# Patient Record
Sex: Female | Born: 1950 | Race: White | Hispanic: No | Marital: Single | Smoking: Former smoker
Health system: Southern US, Community
[De-identification: ages and names within clinical notes are randomized; demographics above are authoritative.]

## PROBLEM LIST (undated history)

## (undated) DIAGNOSIS — K219 Gastro-esophageal reflux disease without esophagitis: Secondary | ICD-10-CM

## (undated) DIAGNOSIS — R112 Nausea with vomiting, unspecified: Secondary | ICD-10-CM

## (undated) DIAGNOSIS — T8859XA Other complications of anesthesia, initial encounter: Secondary | ICD-10-CM

## (undated) DIAGNOSIS — I10 Essential (primary) hypertension: Secondary | ICD-10-CM

## (undated) DIAGNOSIS — E039 Hypothyroidism, unspecified: Secondary | ICD-10-CM

## (undated) DIAGNOSIS — E079 Disorder of thyroid, unspecified: Secondary | ICD-10-CM

## (undated) DIAGNOSIS — T4145XA Adverse effect of unspecified anesthetic, initial encounter: Secondary | ICD-10-CM

## (undated) DIAGNOSIS — Z8619 Personal history of other infectious and parasitic diseases: Secondary | ICD-10-CM

## (undated) DIAGNOSIS — E785 Hyperlipidemia, unspecified: Secondary | ICD-10-CM

## (undated) DIAGNOSIS — G510 Bell's palsy: Secondary | ICD-10-CM

## (undated) DIAGNOSIS — M1990 Unspecified osteoarthritis, unspecified site: Secondary | ICD-10-CM

## (undated) DIAGNOSIS — Z9889 Other specified postprocedural states: Secondary | ICD-10-CM

## (undated) HISTORY — PX: BREAST CYST EXCISION: SHX579

## (undated) HISTORY — PX: THYROID SURGERY: SHX805

## (undated) HISTORY — PX: OTHER SURGICAL HISTORY: SHX169

## (undated) HISTORY — PX: CHOLECYSTECTOMY: SHX55

## (undated) HISTORY — PX: APPENDECTOMY: SHX54

## (undated) HISTORY — PX: COLONOSCOPY: SHX174

## (undated) HISTORY — PX: ABDOMINAL HYSTERECTOMY: SHX81

---

## 1979-08-01 HISTORY — PX: BREAST EXCISIONAL BIOPSY: SUR124

## 1998-11-29 ENCOUNTER — Inpatient Hospital Stay (HOSPITAL_COMMUNITY): Admission: AD | Admit: 1998-11-29 | Discharge: 1998-11-30 | Payer: Self-pay | Admitting: Family Medicine

## 1998-12-04 ENCOUNTER — Encounter: Admission: RE | Admit: 1998-12-04 | Discharge: 1998-12-04 | Payer: Self-pay | Admitting: Family Medicine

## 1999-03-02 ENCOUNTER — Other Ambulatory Visit: Admission: RE | Admit: 1999-03-02 | Discharge: 1999-03-02 | Payer: Self-pay | Admitting: Family Medicine

## 2000-03-20 ENCOUNTER — Encounter: Admission: RE | Admit: 2000-03-20 | Discharge: 2000-03-20 | Payer: Self-pay | Admitting: Family Medicine

## 2000-03-20 ENCOUNTER — Encounter: Payer: Self-pay | Admitting: Family Medicine

## 2000-09-20 ENCOUNTER — Encounter: Payer: Self-pay | Admitting: Family Medicine

## 2000-09-20 ENCOUNTER — Encounter: Admission: RE | Admit: 2000-09-20 | Discharge: 2000-09-20 | Payer: Self-pay | Admitting: Family Medicine

## 2001-03-21 ENCOUNTER — Encounter: Payer: Self-pay | Admitting: Family Medicine

## 2001-03-21 ENCOUNTER — Encounter: Admission: RE | Admit: 2001-03-21 | Discharge: 2001-03-21 | Payer: Self-pay | Admitting: Family Medicine

## 2003-08-09 ENCOUNTER — Inpatient Hospital Stay (HOSPITAL_COMMUNITY): Admission: AD | Admit: 2003-08-09 | Discharge: 2003-08-10 | Payer: Self-pay | Admitting: Emergency Medicine

## 2012-10-03 DIAGNOSIS — H7419 Adhesive middle ear disease, unspecified ear: Secondary | ICD-10-CM | POA: Insufficient documentation

## 2012-10-03 DIAGNOSIS — H902 Conductive hearing loss, unspecified: Secondary | ICD-10-CM | POA: Insufficient documentation

## 2012-11-04 DIAGNOSIS — M26629 Arthralgia of temporomandibular joint, unspecified side: Secondary | ICD-10-CM | POA: Insufficient documentation

## 2012-11-04 DIAGNOSIS — H908 Mixed conductive and sensorineural hearing loss, unspecified: Secondary | ICD-10-CM | POA: Insufficient documentation

## 2012-11-04 DIAGNOSIS — H7419 Adhesive middle ear disease, unspecified ear: Secondary | ICD-10-CM | POA: Insufficient documentation

## 2015-05-21 DIAGNOSIS — Z6831 Body mass index (BMI) 31.0-31.9, adult: Secondary | ICD-10-CM | POA: Diagnosis not present

## 2015-05-21 DIAGNOSIS — R21 Rash and other nonspecific skin eruption: Secondary | ICD-10-CM | POA: Diagnosis not present

## 2015-05-21 DIAGNOSIS — M25571 Pain in right ankle and joints of right foot: Secondary | ICD-10-CM | POA: Diagnosis not present

## 2015-05-21 DIAGNOSIS — L918 Other hypertrophic disorders of the skin: Secondary | ICD-10-CM | POA: Diagnosis not present

## 2015-05-24 DIAGNOSIS — L918 Other hypertrophic disorders of the skin: Secondary | ICD-10-CM | POA: Diagnosis not present

## 2015-05-24 DIAGNOSIS — Z23 Encounter for immunization: Secondary | ICD-10-CM | POA: Diagnosis not present

## 2015-07-08 DIAGNOSIS — Z6830 Body mass index (BMI) 30.0-30.9, adult: Secondary | ICD-10-CM | POA: Diagnosis not present

## 2015-07-08 DIAGNOSIS — E039 Hypothyroidism, unspecified: Secondary | ICD-10-CM | POA: Diagnosis not present

## 2015-07-08 DIAGNOSIS — E7800 Pure hypercholesterolemia, unspecified: Secondary | ICD-10-CM | POA: Diagnosis not present

## 2015-07-08 DIAGNOSIS — K219 Gastro-esophageal reflux disease without esophagitis: Secondary | ICD-10-CM | POA: Diagnosis not present

## 2015-07-08 DIAGNOSIS — R7303 Prediabetes: Secondary | ICD-10-CM | POA: Diagnosis not present

## 2015-07-08 DIAGNOSIS — J309 Allergic rhinitis, unspecified: Secondary | ICD-10-CM | POA: Diagnosis not present

## 2015-08-16 DIAGNOSIS — Z23 Encounter for immunization: Secondary | ICD-10-CM | POA: Diagnosis not present

## 2015-08-17 DIAGNOSIS — Z6830 Body mass index (BMI) 30.0-30.9, adult: Secondary | ICD-10-CM | POA: Diagnosis not present

## 2015-08-17 DIAGNOSIS — N6082 Other benign mammary dysplasias of left breast: Secondary | ICD-10-CM | POA: Diagnosis not present

## 2015-11-04 DIAGNOSIS — H35362 Drusen (degenerative) of macula, left eye: Secondary | ICD-10-CM | POA: Diagnosis not present

## 2016-01-17 DIAGNOSIS — R7303 Prediabetes: Secondary | ICD-10-CM | POA: Diagnosis not present

## 2016-01-17 DIAGNOSIS — Z1231 Encounter for screening mammogram for malignant neoplasm of breast: Secondary | ICD-10-CM | POA: Diagnosis not present

## 2016-01-17 DIAGNOSIS — Z1389 Encounter for screening for other disorder: Secondary | ICD-10-CM | POA: Diagnosis not present

## 2016-01-17 DIAGNOSIS — E890 Postprocedural hypothyroidism: Secondary | ICD-10-CM | POA: Diagnosis not present

## 2016-01-17 DIAGNOSIS — Z6830 Body mass index (BMI) 30.0-30.9, adult: Secondary | ICD-10-CM | POA: Diagnosis not present

## 2016-01-17 DIAGNOSIS — Z9181 History of falling: Secondary | ICD-10-CM | POA: Diagnosis not present

## 2016-01-17 DIAGNOSIS — E7800 Pure hypercholesterolemia, unspecified: Secondary | ICD-10-CM | POA: Diagnosis not present

## 2016-01-17 DIAGNOSIS — E2839 Other primary ovarian failure: Secondary | ICD-10-CM | POA: Diagnosis not present

## 2016-01-17 DIAGNOSIS — K219 Gastro-esophageal reflux disease without esophagitis: Secondary | ICD-10-CM | POA: Diagnosis not present

## 2016-01-17 DIAGNOSIS — Z79899 Other long term (current) drug therapy: Secondary | ICD-10-CM | POA: Diagnosis not present

## 2016-02-03 DIAGNOSIS — Z13820 Encounter for screening for osteoporosis: Secondary | ICD-10-CM | POA: Diagnosis not present

## 2016-02-03 DIAGNOSIS — M8580 Other specified disorders of bone density and structure, unspecified site: Secondary | ICD-10-CM | POA: Diagnosis not present

## 2016-02-03 DIAGNOSIS — Z1231 Encounter for screening mammogram for malignant neoplasm of breast: Secondary | ICD-10-CM | POA: Diagnosis not present

## 2016-02-03 DIAGNOSIS — R29890 Loss of height: Secondary | ICD-10-CM | POA: Diagnosis not present

## 2016-02-03 DIAGNOSIS — E2839 Other primary ovarian failure: Secondary | ICD-10-CM | POA: Diagnosis not present

## 2016-03-07 DIAGNOSIS — J309 Allergic rhinitis, unspecified: Secondary | ICD-10-CM | POA: Diagnosis not present

## 2016-03-07 DIAGNOSIS — Z6831 Body mass index (BMI) 31.0-31.9, adult: Secondary | ICD-10-CM | POA: Diagnosis not present

## 2016-03-07 DIAGNOSIS — M19041 Primary osteoarthritis, right hand: Secondary | ICD-10-CM | POA: Diagnosis not present

## 2016-05-20 DIAGNOSIS — R69 Illness, unspecified: Secondary | ICD-10-CM | POA: Diagnosis not present

## 2016-07-14 DIAGNOSIS — R7303 Prediabetes: Secondary | ICD-10-CM | POA: Diagnosis not present

## 2016-07-14 DIAGNOSIS — E890 Postprocedural hypothyroidism: Secondary | ICD-10-CM | POA: Diagnosis not present

## 2016-07-14 DIAGNOSIS — E7800 Pure hypercholesterolemia, unspecified: Secondary | ICD-10-CM | POA: Diagnosis not present

## 2016-07-18 DIAGNOSIS — E669 Obesity, unspecified: Secondary | ICD-10-CM | POA: Diagnosis not present

## 2016-07-18 DIAGNOSIS — Z6830 Body mass index (BMI) 30.0-30.9, adult: Secondary | ICD-10-CM | POA: Diagnosis not present

## 2016-07-18 DIAGNOSIS — E7800 Pure hypercholesterolemia, unspecified: Secondary | ICD-10-CM | POA: Diagnosis not present

## 2016-07-18 DIAGNOSIS — K219 Gastro-esophageal reflux disease without esophagitis: Secondary | ICD-10-CM | POA: Diagnosis not present

## 2016-07-18 DIAGNOSIS — E890 Postprocedural hypothyroidism: Secondary | ICD-10-CM | POA: Diagnosis not present

## 2016-07-18 DIAGNOSIS — M19041 Primary osteoarthritis, right hand: Secondary | ICD-10-CM | POA: Diagnosis not present

## 2016-07-18 DIAGNOSIS — J309 Allergic rhinitis, unspecified: Secondary | ICD-10-CM | POA: Diagnosis not present

## 2017-01-15 DIAGNOSIS — E039 Hypothyroidism, unspecified: Secondary | ICD-10-CM | POA: Diagnosis not present

## 2017-01-15 DIAGNOSIS — R7303 Prediabetes: Secondary | ICD-10-CM | POA: Diagnosis not present

## 2017-01-15 DIAGNOSIS — E7800 Pure hypercholesterolemia, unspecified: Secondary | ICD-10-CM | POA: Diagnosis not present

## 2017-01-18 DIAGNOSIS — E669 Obesity, unspecified: Secondary | ICD-10-CM | POA: Diagnosis not present

## 2017-01-18 DIAGNOSIS — K219 Gastro-esophageal reflux disease without esophagitis: Secondary | ICD-10-CM | POA: Diagnosis not present

## 2017-01-18 DIAGNOSIS — Z1231 Encounter for screening mammogram for malignant neoplasm of breast: Secondary | ICD-10-CM | POA: Diagnosis not present

## 2017-01-18 DIAGNOSIS — Z1389 Encounter for screening for other disorder: Secondary | ICD-10-CM | POA: Diagnosis not present

## 2017-01-18 DIAGNOSIS — Z9181 History of falling: Secondary | ICD-10-CM | POA: Diagnosis not present

## 2017-01-18 DIAGNOSIS — R7303 Prediabetes: Secondary | ICD-10-CM | POA: Diagnosis not present

## 2017-01-18 DIAGNOSIS — E7800 Pure hypercholesterolemia, unspecified: Secondary | ICD-10-CM | POA: Diagnosis not present

## 2017-01-18 DIAGNOSIS — Z6830 Body mass index (BMI) 30.0-30.9, adult: Secondary | ICD-10-CM | POA: Diagnosis not present

## 2017-01-18 DIAGNOSIS — E890 Postprocedural hypothyroidism: Secondary | ICD-10-CM | POA: Diagnosis not present

## 2017-02-08 DIAGNOSIS — Z1231 Encounter for screening mammogram for malignant neoplasm of breast: Secondary | ICD-10-CM | POA: Diagnosis not present

## 2017-02-17 DIAGNOSIS — H2513 Age-related nuclear cataract, bilateral: Secondary | ICD-10-CM | POA: Diagnosis not present

## 2017-02-17 DIAGNOSIS — H524 Presbyopia: Secondary | ICD-10-CM | POA: Diagnosis not present

## 2017-02-17 DIAGNOSIS — H43391 Other vitreous opacities, right eye: Secondary | ICD-10-CM | POA: Diagnosis not present

## 2017-02-17 DIAGNOSIS — H04123 Dry eye syndrome of bilateral lacrimal glands: Secondary | ICD-10-CM | POA: Diagnosis not present

## 2017-02-21 ENCOUNTER — Encounter: Payer: Self-pay | Admitting: Emergency Medicine

## 2017-02-21 ENCOUNTER — Emergency Department
Admission: EM | Admit: 2017-02-21 | Discharge: 2017-02-22 | Disposition: A | Discharge status: Home / Self Care | Payer: Medicare HMO | Attending: Emergency Medicine | Admitting: Emergency Medicine

## 2017-02-21 DIAGNOSIS — R1012 Left upper quadrant pain: Secondary | ICD-10-CM | POA: Diagnosis not present

## 2017-02-21 DIAGNOSIS — Z1389 Encounter for screening for other disorder: Secondary | ICD-10-CM | POA: Diagnosis not present

## 2017-02-21 DIAGNOSIS — R1011 Right upper quadrant pain: Secondary | ICD-10-CM | POA: Diagnosis not present

## 2017-02-21 DIAGNOSIS — E785 Hyperlipidemia, unspecified: Secondary | ICD-10-CM | POA: Diagnosis not present

## 2017-02-21 DIAGNOSIS — A09 Infectious gastroenteritis and colitis, unspecified: Secondary | ICD-10-CM | POA: Diagnosis not present

## 2017-02-21 DIAGNOSIS — R197 Diarrhea, unspecified: Secondary | ICD-10-CM | POA: Diagnosis present

## 2017-02-21 DIAGNOSIS — Z0000 Encounter for general adult medical examination without abnormal findings: Secondary | ICD-10-CM | POA: Diagnosis not present

## 2017-02-21 DIAGNOSIS — Z9181 History of falling: Secondary | ICD-10-CM | POA: Diagnosis not present

## 2017-02-21 DIAGNOSIS — R109 Unspecified abdominal pain: Secondary | ICD-10-CM | POA: Diagnosis not present

## 2017-02-21 DIAGNOSIS — R1013 Epigastric pain: Secondary | ICD-10-CM | POA: Diagnosis not present

## 2017-02-21 DIAGNOSIS — Z23 Encounter for immunization: Secondary | ICD-10-CM | POA: Diagnosis not present

## 2017-02-21 LAB — URINALYSIS, COMPLETE (UACMP) WITH MICROSCOPIC
BILIRUBIN URINE: NEGATIVE
Glucose, UA: NEGATIVE mg/dL
KETONES UR: 5 mg/dL — AB
Leukocytes, UA: NEGATIVE
Nitrite: NEGATIVE
Protein, ur: 30 mg/dL — AB
RBC / HPF: NONE SEEN RBC/hpf (ref 0–5)
SPECIFIC GRAVITY, URINE: 1.02 (ref 1.005–1.030)
pH: 5 (ref 5.0–8.0)

## 2017-02-21 LAB — COMPREHENSIVE METABOLIC PANEL
ALBUMIN: 4.1 g/dL (ref 3.5–5.0)
ALT: 30 U/L (ref 14–54)
AST: 25 U/L (ref 15–41)
Alkaline Phosphatase: 91 U/L (ref 38–126)
Anion gap: 9 (ref 5–15)
BUN: 13 mg/dL (ref 6–20)
CHLORIDE: 104 mmol/L (ref 101–111)
CO2: 26 mmol/L (ref 22–32)
CREATININE: 0.92 mg/dL (ref 0.44–1.00)
Calcium: 9 mg/dL (ref 8.9–10.3)
GFR calc non Af Amer: >60 mL/min (ref >60)
GLUCOSE: 112 mg/dL — AB (ref 65–99)
Potassium: 4.3 mmol/L (ref 3.5–5.1)
SODIUM: 139 mmol/L (ref 135–145)
Total Bilirubin: 1 mg/dL (ref 0.3–1.2)
Total Protein: 7.7 g/dL (ref 6.5–8.1)

## 2017-02-21 LAB — LIPASE, BLOOD: LIPASE: 20 U/L (ref 11–51)

## 2017-02-21 LAB — CBC
HCT: 43.4 % (ref 35.0–47.0)
Hemoglobin: 14.5 g/dL (ref 12.0–16.0)
MCH: 27.9 pg (ref 26.0–34.0)
MCHC: 33.4 g/dL (ref 32.0–36.0)
MCV: 83.7 fL (ref 80.0–100.0)
PLATELETS: 331 10*3/uL (ref 150–440)
RBC: 5.19 MIL/uL (ref 3.80–5.20)
RDW: 14.7 % — ABNORMAL HIGH (ref 11.5–14.5)
WBC: 8 10*3/uL (ref 3.6–11.0)

## 2017-02-21 NOTE — ED Triage Notes (Signed)
Pt sent from [facility] with diarrhea and abd pain for 3 weeks.  No vomiting.  Pt has nausea.  No chest pain or sob.  Pt alert.

## 2017-02-21 NOTE — ED Notes (Signed)
Pt states that she had diarrhea for 3 weeks. Not sure what caused it. Family at bedside.

## 2017-02-22 ENCOUNTER — Emergency Department: Payer: Medicare HMO

## 2017-02-22 ENCOUNTER — Encounter: Payer: Self-pay | Admitting: Radiology

## 2017-02-22 DIAGNOSIS — R197 Diarrhea, unspecified: Secondary | ICD-10-CM | POA: Diagnosis not present

## 2017-02-22 DIAGNOSIS — R1013 Epigastric pain: Secondary | ICD-10-CM | POA: Diagnosis not present

## 2017-02-22 MED ORDER — SODIUM CHLORIDE 0.9 % IV BOLUS (SEPSIS)
2000.0000 mL | Freq: Once | INTRAVENOUS | Status: AC
  Administered 2017-02-22: 2000 mL via INTRAVENOUS

## 2017-02-22 MED ORDER — CIPROFLOXACIN HCL 500 MG PO TABS
500.0000 mg | ORAL_TABLET | Freq: Two times a day (BID) | ORAL | 0 refills | Status: AC
Start: 2017-02-22 — End: 2017-03-04

## 2017-02-22 MED ORDER — IOPAMIDOL (ISOVUE-300) INJECTION 61%
100.0000 mL | Freq: Once | INTRAVENOUS | Status: AC | PRN
  Administered 2017-02-22: 100 mL via INTRAVENOUS

## 2017-02-22 MED ORDER — ONDANSETRON HCL 4 MG/2ML IJ SOLN
4.0000 mg | Freq: Once | INTRAMUSCULAR | Status: AC
  Administered 2017-02-22: 4 mg via INTRAVENOUS
  Filled 2017-02-22: qty 2

## 2017-02-22 MED ORDER — IOPAMIDOL (ISOVUE-300) INJECTION 61%
30.0000 mL | Freq: Once | INTRAVENOUS | Status: AC
Start: 2017-02-22 — End: 2017-02-22
  Administered 2017-02-22: 30 mL via ORAL

## 2017-02-22 MED ORDER — OXYCODONE-ACETAMINOPHEN 5-325 MG PO TABS: 1.0000 | ORAL_TABLET | ORAL | 0 refills | Status: DC | PRN

## 2017-02-22 MED ORDER — METRONIDAZOLE 500 MG PO TABS: 500.0000 mg | ORAL_TABLET | Freq: Two times a day (BID) | ORAL | 0 refills | Status: AC

## 2017-02-22 MED ORDER — MORPHINE SULFATE (PF) 2 MG/ML IV SOLN
2.0000 mg | Freq: Once | INTRAVENOUS | Status: AC
  Administered 2017-02-22: 2 mg via INTRAVENOUS
  Filled 2017-02-22: qty 1

## 2017-02-22 NOTE — ED Provider Notes (Addendum)
[facility] Emergency Department Provider Note   First MD Initiated Contact with Patient 02/22/17 0000     (approximate)  I have reviewed the triage vital signs and the nursing notes.   HISTORY  Chief Complaint Diarrhea    HPI Rebecca Conner is a 66 y.o. female presents to the emergency department with 3 week history of diarrhea with mucus. Patient states that she visited her daughter who is in a nursing home and subsequently had [employer] before onset of symptoms. Patient states that following leaving [employer] approximately 10-15 minutes after ingestion she started having abdominal cramping and has had that since. Patient stated diarrhea ensued shortly thereafter. Patient denies any fever no vomiting. Patient does admit to abdominal cramping recurrent pain score 8 out of 10.   Past medical history No pertinent past medical history There are no active problems to display for this patient.   Past surgical history  None  Prior to Admission medications   Medication Sig Start Date End Date Taking? Authorizing Provider  ciprofloxacin (CIPRO) 500 MG tablet Take 1 tablet (500 mg total) by mouth 2 (two) times daily. 02/22/17 03/04/17  Gregor Hams, MD  metroNIDAZOLE (FLAGYL) 500 MG tablet Take 1 tablet (500 mg total) by mouth 2 (two) times daily. 02/22/17 03/04/17  Gregor Hams, MD  oxyCODONE-acetaminophen (ROXICET) 5-325 MG tablet Take 1 tablet by mouth every 4 (four) hours as needed for severe pain. 02/22/17   Gregor Hams, MD    Allergies Tetanus toxoids and Motrin [ibuprofen]  No family history on file.  Social History Social History  Substance Use Topics  . Smoking status: Not on file  . Smokeless tobacco: Not on file  . Alcohol use Not on file    Review of Systems Constitutional: No fever/chills Eyes: No visual changes. ENT: No sore throat. Cardiovascular: Denies chest pain. Respiratory: Denies shortness of  breath. Gastrointestinal:Positive for abdominal cramping and diarrhea  Genitourinary: Negative for dysuria. Musculoskeletal: Negative for neck pain.  Negative for back pain. Integumentary: Negative for rash. Neurological: Negative for headaches, focal weakness or numbness.   ____________________________________________   PHYSICAL EXAM:  VITAL SIGNS: ED Triage Vitals  Enc Vitals Group     BP 02/21/17 2011 (!) 163/81     Pulse Rate 02/21/17 2011 72     Resp 02/21/17 2011 18     Temp 02/21/17 2011 98.6 F (37 C)     Temp Source 02/21/17 2011 Oral     SpO2 02/21/17 2011 99 %     Weight 02/21/17 2005 81.6 kg (180 lb)     Height 02/21/17 2005 1.651 m (5\' 5" )     Head Circumference --      Peak Flow --      Pain Score 02/21/17 2004 8     Pain Loc --      Pain Edu? --      Excl. in [place]? --     Constitutional: Alert and oriented. Well appearing and in no acute distress. Eyes: Conjunctivae are normal.  Head: Atraumatic. Mouth/Throat: Mucous membranes are Dry  Neck: No stridor.   Cardiovascular: Normal rate, regular rhythm. Good peripheral circulation. Grossly normal heart sounds. Respiratory: Normal respiratory effort.  No retractions. Lungs CTAB. Gastrointestinal: Right lower quadrant/. Right upper quadrant left upper quadrant tenderness palpation. No distention.  Musculoskeletal: No lower extremity tenderness nor edema. No gross deformities of extremities. Neurologic:  Normal speech and language. No gross focal neurologic deficits are appreciated.  Skin:  Skin is warm, dry and intact. No rash noted. Psychiatric: Mood and affect are normal. Speech and behavior are normal.  ____________________________________________   LABS (all labs ordered are listed, but only abnormal results are displayed)  Labs Reviewed  COMPREHENSIVE METABOLIC PANEL - Abnormal; Notable for the following:       Result Value   Glucose, Bld 112 (*)    All other components within normal limits  CBC -  Abnormal; Notable for the following:    RDW 14.7 (*)    All other components within normal limits  URINALYSIS, COMPLETE (UACMP) WITH MICROSCOPIC - Abnormal; Notable for the following:    Color, Urine AMBER (*)    APPearance HAZY (*)    Hgb urine dipstick SMALL (*)    Ketones, ur 5 (*)    Protein, ur 30 (*)    Bacteria, UA RARE (*)    Squamous Epithelial / LPF 0-5 (*)    All other components within normal limits  GASTROINTESTINAL PANEL BY PCR, STOOL (REPLACES STOOL CULTURE)  C DIFFICILE QUICK SCREEN W PCR REFLEX  LIPASE, BLOOD   __  RADIOLOGY I, [place] Nancylee Gaines, personally viewed and evaluated these images (plain radiographs) as part of my medical decision making, as well as reviewing the written report by the radiologist.  Ct Abdomen Pelvis W Contrast  Result Date: 02/22/2017 CLINICAL DATA:  Pain in the right upper quadrant, epigastric, and left upper quadrant abdominal regions. Diarrhea. Symptoms for 2 weeks. EXAM: CT ABDOMEN AND PELVIS WITH CONTRAST TECHNIQUE: Multidetector CT imaging of the abdomen and pelvis was performed using the standard protocol following bolus administration of intravenous contrast. CONTRAST:  116mL ISOVUE-300 IOPAMIDOL (ISOVUE-300) INJECTION 61% COMPARISON:  None. FINDINGS: Lower chest: Atelectasis and motion artifact in the lung bases. Hepatobiliary: No focal liver abnormality is seen. Status post cholecystectomy. No biliary dilatation. Pancreas: Unremarkable. No pancreatic ductal dilatation or surrounding inflammatory changes. Spleen: Normal in size without focal abnormality. Adrenals/Urinary Tract: Adrenal glands are unremarkable. Kidneys are normal, without renal calculi, focal lesion, or hydronephrosis. Bladder is unremarkable. Stomach/Bowel: Stomach is within normal limits. Appendix appears normal. No evidence of bowel wall thickening, distention, or inflammatory changes. Vascular/Lymphatic: No significant vascular findings are present. No enlarged abdominal  or pelvic lymph nodes. Reproductive: Status post hysterectomy. No adnexal masses. Other: No abdominal wall hernia or abnormality. No abdominopelvic ascites. Musculoskeletal: No acute or significant osseous findings. IMPRESSION: No acute process demonstrated in the abdomen or pelvis. No evidence of bowel obstruction or inflammation. Electronically Signed   By: Lucienne Capers M.D.   On: 02/22/2017 01:53    ____________________________________________  Procedures  ED ECG REPORT I, [place] N Dian Laprade, the attending physician, personally viewed and interpreted this ECG.   Date: 02/22/2017  EKG Time: 8:16 PM  Rate: 65  Rhythm: Normal sinus rhythm  Axis: Normal  Intervals: Normal  ST&T Change: None  ____________________________________________   INITIAL IMPRESSION / ASSESSMENT AND PLAN / ED COURSE  Pertinent labs & imaging results that were available during my care of the patient were reviewed by me and considered in my medical decision making (see chart for details).  66 year old female presenting to the emergency department with a three-week history of diarrhea with mucus. Concern for infectious diarrhea. Patient was unable to give a stool sample in the emergency department CT scan of the abdomen and pelvis was unremarkable. A spoke with the patient at length regarding her treatment with antibiotics which she requested.      ____________________________________________  FINAL CLINICAL IMPRESSION(S) /  ED DIAGNOSES  Final diagnoses:  Infectious diarrhea     MEDICATIONS GIVEN DURING THIS VISIT:  Medications  sodium chloride 0.9 % bolus 2,000 mL (2,000 mLs Intravenous New Bag/Given 02/22/17 0112)  iopamidol (ISOVUE-300) 61 % injection 30 mL (30 mLs Oral Contrast Given 02/22/17 0028)  morphine 2 MG/ML injection 2 mg (2 mg Intravenous Given 02/22/17 0113)  ondansetron (ZOFRAN) injection 4 mg (4 mg Intravenous Given 02/22/17 0112)  iopamidol (ISOVUE-300) 61 % injection 100 mL (100 mLs  Intravenous Contrast Given 02/22/17 0130)     NEW OUTPATIENT MEDICATIONS STARTED DURING THIS VISIT:  New Prescriptions   CIPROFLOXACIN (CIPRO) 500 MG TABLET    Take 1 tablet (500 mg total) by mouth 2 (two) times daily.   METRONIDAZOLE (FLAGYL) 500 MG TABLET    Take 1 tablet (500 mg total) by mouth 2 (two) times daily.   OXYCODONE-ACETAMINOPHEN (ROXICET) 5-325 MG TABLET    Take 1 tablet by mouth every 4 (four) hours as needed for severe pain.    Modified Medications   No medications on file    Discontinued Medications   No medications on file     Note:  This document was prepared using Dragon voice recognition software and may include unintentional dictation errors.    Gregor Hams, MD 02/22/17 [id number]    Gregor Hams, MD 02/22/17 [phone]

## 2017-02-26 DIAGNOSIS — Z79899 Other long term (current) drug therapy: Secondary | ICD-10-CM | POA: Diagnosis not present

## 2017-02-26 DIAGNOSIS — Z6829 Body mass index (BMI) 29.0-29.9, adult: Secondary | ICD-10-CM | POA: Diagnosis not present

## 2017-02-26 DIAGNOSIS — A09 Infectious gastroenteritis and colitis, unspecified: Secondary | ICD-10-CM | POA: Diagnosis not present

## 2017-02-28 ENCOUNTER — Encounter: Payer: Self-pay | Admitting: Emergency Medicine

## 2017-02-28 ENCOUNTER — Emergency Department
Admission: EM | Admit: 2017-02-28 | Discharge: 2017-02-28 | Disposition: A | Discharge status: Home / Self Care | Payer: Medicare HMO | Attending: Emergency Medicine | Admitting: Emergency Medicine

## 2017-02-28 DIAGNOSIS — M79605 Pain in left leg: Secondary | ICD-10-CM | POA: Insufficient documentation

## 2017-02-28 DIAGNOSIS — R197 Diarrhea, unspecified: Secondary | ICD-10-CM | POA: Diagnosis not present

## 2017-02-28 DIAGNOSIS — M79604 Pain in right leg: Secondary | ICD-10-CM | POA: Insufficient documentation

## 2017-02-28 DIAGNOSIS — T50905A Adverse effect of unspecified drugs, medicaments and biological substances, initial encounter: Secondary | ICD-10-CM

## 2017-02-28 DIAGNOSIS — T368X5A Adverse effect of other systemic antibiotics, initial encounter: Secondary | ICD-10-CM | POA: Diagnosis not present

## 2017-02-28 DIAGNOSIS — G5770 Causalgia of unspecified lower limb: Secondary | ICD-10-CM | POA: Diagnosis present

## 2017-02-28 DIAGNOSIS — R531 Weakness: Secondary | ICD-10-CM | POA: Diagnosis not present

## 2017-02-28 DIAGNOSIS — M6281 Muscle weakness (generalized): Secondary | ICD-10-CM | POA: Diagnosis not present

## 2017-02-28 LAB — TROPONIN I

## 2017-02-28 LAB — BASIC METABOLIC PANEL
ANION GAP: 8 (ref 5–15)
BUN: 11 mg/dL (ref 6–20)
CO2: 27 mmol/L (ref 22–32)
Calcium: 8.6 mg/dL — ABNORMAL LOW (ref 8.9–10.3)
Chloride: 105 mmol/L (ref 101–111)
Creatinine, Ser: 0.79 mg/dL (ref 0.44–1.00)
GFR calc Af Amer: >60 mL/min (ref >60)
GLUCOSE: 97 mg/dL (ref 65–99)
POTASSIUM: 4.3 mmol/L (ref 3.5–5.1)
Sodium: 140 mmol/L (ref 135–145)

## 2017-02-28 LAB — URINALYSIS, COMPLETE (UACMP) WITH MICROSCOPIC
BACTERIA UA: NONE SEEN
Bilirubin Urine: NEGATIVE
Glucose, UA: NEGATIVE mg/dL
HGB URINE DIPSTICK: NEGATIVE
Ketones, ur: NEGATIVE mg/dL
Nitrite: NEGATIVE
PH: 5 (ref 5.0–8.0)
PROTEIN: NEGATIVE mg/dL
RBC / HPF: NONE SEEN RBC/hpf (ref 0–5)
SPECIFIC GRAVITY, URINE: 1.01 (ref 1.005–1.030)

## 2017-02-28 LAB — CBC
HEMATOCRIT: 41.6 % (ref 35.0–47.0)
Hemoglobin: 13.7 g/dL (ref 12.0–16.0)
MCH: 27.5 pg (ref 26.0–34.0)
MCHC: 33 g/dL (ref 32.0–36.0)
MCV: 83.2 fL (ref 80.0–100.0)
PLATELETS: 332 10*3/uL (ref 150–440)
RBC: 5 MIL/uL (ref 3.80–5.20)
RDW: 14.9 % — ABNORMAL HIGH (ref 11.5–14.5)
WBC: 6.9 10*3/uL (ref 3.6–11.0)

## 2017-02-28 MED ORDER — DIAZEPAM 5 MG PO TABS
5.0000 mg | ORAL_TABLET | Freq: Once | ORAL | Status: AC
  Administered 2017-02-28: 5 mg via ORAL
  Filled 2017-02-28: qty 1

## 2017-02-28 MED ORDER — DIAZEPAM 5 MG PO TABS: 5.0000 mg | ORAL_TABLET | Freq: Three times a day (TID) | ORAL | 0 refills | Status: DC | PRN

## 2017-02-28 MED ORDER — ADULT MULTIVITAMIN W/MINERALS CH
1.0000 | ORAL_TABLET | Freq: Once | ORAL | Status: AC
  Administered 2017-02-28: 1 via ORAL
  Filled 2017-02-28: qty 1

## 2017-02-28 MED ORDER — IBUPROFEN 600 MG PO TABS
600.0000 mg | ORAL_TABLET | Freq: Once | ORAL | Status: DC
  Filled 2017-02-28: qty 1

## 2017-02-28 NOTE — ED Provider Notes (Signed)
[facility] Emergency Department Provider Note       Time seen: ----------------------------------------- 5:57 PM on 02/28/2017 -----------------------------------------     I have reviewed the triage vital signs and the nursing notes.   HISTORY   Chief Complaint Medication Reaction    HPI Rebecca Conner is a 66 y.o. female who presents to the ED for a possible medication reaction. Patient reports she was started on Cipro and Flagyl a week ago for a bacterial intestinal infection. Patient reports ever since then her legs have hurt and she can't walk as fast as she normally does. She reports her legs are ice cold. Pain is 7 out of 10 in both legs, nothing makes it better or worse.   History reviewed. No pertinent past medical history.  There are no active problems to display for this patient.   No past surgical history on file.  Allergies Tetanus toxoids and Motrin [ibuprofen]  Social History Social History  Substance Use Topics  . Smoking status: Not on file  . Smokeless tobacco: Not on file  . Alcohol use Not on file    Review of Systems Constitutional: Negative for fever. Eyes: Negative for vision changes ENT:  Negative for congestion, sore throat Cardiovascular: Negative for chest pain. Respiratory: Negative for shortness of breath. Gastrointestinal: Negative for abdominal pain, vomiting and diarrhea. Genitourinary: Negative for dysuria. Musculoskeletal: Positive for leg pain Skin: Negative for rash. Neurological: Negative for headaches, positive for weakness  All systems negative/normal/unremarkable except as stated in the HPI  ____________________________________________   PHYSICAL EXAM:  VITAL SIGNS: ED Triage Vitals [02/28/17 1706]  Enc Vitals Group     BP (!) 184/86     Pulse Rate 78     Resp 16     Temp 97.9 F (36.6 C)     Temp Source Oral     SpO2 97 %     Weight 180 lb (81.6 kg)     Height 5\' 5"  (1.651 m)      Head Circumference      Peak Flow      Pain Score 7     Pain Loc      Pain Edu?      Excl. in [place]?     Constitutional: Alert and oriented. Well appearing and in no distress. Eyes: Conjunctivae are normal. Normal extraocular movements. ENT   Head: Normocephalic and atraumatic.   Nose: No congestion/rhinnorhea.   Mouth/Throat: Mucous membranes are moist.   Neck: No stridor. Cardiovascular: Normal rate, regular rhythm. No murmurs, rubs, or gallops. Respiratory: Normal respiratory effort without tachypnea nor retractions. Breath sounds are clear and equal bilaterally. No wheezes/rales/rhonchi. Gastrointestinal: Soft and nontender. Normal bowel sounds Musculoskeletal: Mild pain with some limitation in range of motion of both lower extremities. Legs are warm to touch with good pulses Neurologic:  Normal speech and language. No gross focal neurologic deficits are appreciated.  Skin:  Skin is warm, dry and intact. No rash noted. Psychiatric: Mood and affect are normal. Speech and behavior are normal.  ____________________________________________  ED COURSE:  Pertinent labs & imaging results that were available during my care of the patient were reviewed by me and considered in my medical decision making (see chart for details). Patient presents for weakness and leg pain, we will assess with labs and imaging as indicated.   Procedures ____________________________________________   LABS (pertinent positives/negatives)  Labs Reviewed  CBC - Abnormal; Notable for the following:       Result Value  RDW 14.9 (*)    All other components within normal limits  URINALYSIS, COMPLETE (UACMP) WITH MICROSCOPIC - Abnormal; Notable for the following:    Color, Urine YELLOW (*)    APPearance CLEAR (*)    Leukocytes, UA TRACE (*)    Squamous Epithelial / LPF 0-5 (*)    All other components within normal limits  BASIC METABOLIC PANEL  TROPONIN I    ____________________________________________  FINAL ASSESSMENT AND PLAN  Weakness, leg pain  Plan: Patient's labs and imaging were dictated above. Patient had presented for Weakness and leg pain of uncertain etiology. Patient reports her diarrhea has improved and I will advise she stop the Cipro and Flagyl as his may possibly be a medication side effect. Calcium was slightly low, we gave her a multivitamin as well as Motrin and Valium here.   Earleen Newport, MD   Note: This note was generated in part or whole with voice recognition software. Voice recognition is usually quite accurate but there are transcription errors that can and very often do occur. I apologize for any typographical errors that were not detected and corrected.     Earleen Newport, MD 02/28/17 [phone]

## 2017-02-28 NOTE — ED Triage Notes (Signed)
Pt reports started flagyl and cipro on 02/21/17 for bacterial intestinal infection. Pt reports ever since starting the medications her legs have hurt and she can't walk as fast as she normally does. Pt reports her legs are ice cold. Pt lower legs warm to touch, pedal pulses palpated bilaterally.

## 2017-03-06 ENCOUNTER — Inpatient Hospital Stay
Admission: EM | Admit: 2017-03-06 | Discharge: 2017-03-12 | DRG: 872 | Disposition: A | Discharge status: Home Health | Payer: Medicare HMO | Attending: Internal Medicine | Admitting: Internal Medicine

## 2017-03-06 ENCOUNTER — Emergency Department: Payer: Medicare HMO

## 2017-03-06 ENCOUNTER — Encounter: Payer: Self-pay | Admitting: Emergency Medicine

## 2017-03-06 DIAGNOSIS — R911 Solitary pulmonary nodule: Secondary | ICD-10-CM | POA: Diagnosis present

## 2017-03-06 DIAGNOSIS — A0472 Enterocolitis due to Clostridium difficile, not specified as recurrent: Secondary | ICD-10-CM | POA: Diagnosis present

## 2017-03-06 DIAGNOSIS — Z90710 Acquired absence of both cervix and uterus: Secondary | ICD-10-CM

## 2017-03-06 DIAGNOSIS — Z886 Allergy status to analgesic agent status: Secondary | ICD-10-CM

## 2017-03-06 DIAGNOSIS — R739 Hyperglycemia, unspecified: Secondary | ICD-10-CM | POA: Diagnosis not present

## 2017-03-06 DIAGNOSIS — R1310 Dysphagia, unspecified: Secondary | ICD-10-CM | POA: Diagnosis not present

## 2017-03-06 DIAGNOSIS — T50901A Poisoning by unspecified drugs, medicaments and biological substances, accidental (unintentional), initial encounter: Secondary | ICD-10-CM | POA: Diagnosis not present

## 2017-03-06 DIAGNOSIS — R1084 Generalized abdominal pain: Secondary | ICD-10-CM | POA: Diagnosis not present

## 2017-03-06 DIAGNOSIS — B9689 Other specified bacterial agents as the cause of diseases classified elsewhere: Secondary | ICD-10-CM | POA: Diagnosis not present

## 2017-03-06 DIAGNOSIS — I10 Essential (primary) hypertension: Secondary | ICD-10-CM | POA: Diagnosis not present

## 2017-03-06 DIAGNOSIS — Z881 Allergy status to other antibiotic agents status: Secondary | ICD-10-CM | POA: Diagnosis not present

## 2017-03-06 DIAGNOSIS — A419 Sepsis, unspecified organism: Secondary | ICD-10-CM | POA: Diagnosis present

## 2017-03-06 DIAGNOSIS — G4440 Drug-induced headache, not elsewhere classified, not intractable: Secondary | ICD-10-CM | POA: Diagnosis not present

## 2017-03-06 DIAGNOSIS — A414 Sepsis due to anaerobes: Principal | ICD-10-CM | POA: Diagnosis present

## 2017-03-06 DIAGNOSIS — Z8249 Family history of ischemic heart disease and other diseases of the circulatory system: Secondary | ICD-10-CM | POA: Diagnosis not present

## 2017-03-06 DIAGNOSIS — R531 Weakness: Secondary | ICD-10-CM | POA: Diagnosis not present

## 2017-03-06 DIAGNOSIS — B370 Candidal stomatitis: Secondary | ICD-10-CM | POA: Diagnosis not present

## 2017-03-06 DIAGNOSIS — R112 Nausea with vomiting, unspecified: Secondary | ICD-10-CM | POA: Diagnosis not present

## 2017-03-06 DIAGNOSIS — K219 Gastro-esophageal reflux disease without esophagitis: Secondary | ICD-10-CM | POA: Diagnosis not present

## 2017-03-06 DIAGNOSIS — R221 Localized swelling, mass and lump, neck: Secondary | ICD-10-CM | POA: Diagnosis not present

## 2017-03-06 DIAGNOSIS — Z79899 Other long term (current) drug therapy: Secondary | ICD-10-CM | POA: Diagnosis not present

## 2017-03-06 DIAGNOSIS — Z7982 Long term (current) use of aspirin: Secondary | ICD-10-CM

## 2017-03-06 DIAGNOSIS — E876 Hypokalemia: Secondary | ICD-10-CM | POA: Diagnosis present

## 2017-03-06 DIAGNOSIS — R498 Other voice and resonance disorders: Secondary | ICD-10-CM | POA: Diagnosis not present

## 2017-03-06 DIAGNOSIS — D72829 Elevated white blood cell count, unspecified: Secondary | ICD-10-CM | POA: Diagnosis not present

## 2017-03-06 DIAGNOSIS — K529 Noninfective gastroenteritis and colitis, unspecified: Secondary | ICD-10-CM

## 2017-03-06 DIAGNOSIS — R109 Unspecified abdominal pain: Secondary | ICD-10-CM | POA: Diagnosis not present

## 2017-03-06 DIAGNOSIS — R1313 Dysphagia, pharyngeal phase: Secondary | ICD-10-CM | POA: Diagnosis not present

## 2017-03-06 DIAGNOSIS — Z887 Allergy status to serum and vaccine status: Secondary | ICD-10-CM | POA: Diagnosis not present

## 2017-03-06 DIAGNOSIS — E039 Hypothyroidism, unspecified: Secondary | ICD-10-CM | POA: Diagnosis not present

## 2017-03-06 DIAGNOSIS — Z888 Allergy status to other drugs, medicaments and biological substances status: Secondary | ICD-10-CM | POA: Diagnosis not present

## 2017-03-06 DIAGNOSIS — K209 Esophagitis, unspecified: Secondary | ICD-10-CM | POA: Diagnosis not present

## 2017-03-06 DIAGNOSIS — R4182 Altered mental status, unspecified: Secondary | ICD-10-CM | POA: Diagnosis not present

## 2017-03-06 DIAGNOSIS — R1110 Vomiting, unspecified: Secondary | ICD-10-CM | POA: Diagnosis not present

## 2017-03-06 HISTORY — DX: Disorder of thyroid, unspecified: E07.9

## 2017-03-06 HISTORY — DX: Essential (primary) hypertension: I10

## 2017-03-06 LAB — CBC WITH DIFFERENTIAL/PLATELET
Basophils Absolute: 0 10*3/uL (ref 0–0.1)
Basophils Relative: 0 %
EOS PCT: 0 %
Eosinophils Absolute: 0 10*3/uL (ref 0–0.7)
HCT: 39.3 % (ref 35.0–47.0)
HEMOGLOBIN: 13.3 g/dL (ref 12.0–16.0)
LYMPHS ABS: 0.5 10*3/uL — AB (ref 1.0–3.6)
Lymphocytes Relative: 3 %
MCH: 27.5 pg (ref 26.0–34.0)
MCHC: 33.9 g/dL (ref 32.0–36.0)
MCV: 81.1 fL (ref 80.0–100.0)
MONOS PCT: 5 %
Monocytes Absolute: 0.8 10*3/uL (ref 0.2–0.9)
NEUTROS PCT: 92 %
Neutro Abs: 14.5 10*3/uL — ABNORMAL HIGH (ref 1.4–6.5)
Platelets: 300 10*3/uL (ref 150–440)
RBC: 4.84 MIL/uL (ref 3.80–5.20)
RDW: 14.7 % — ABNORMAL HIGH (ref 11.5–14.5)
WBC: 15.8 10*3/uL — AB (ref 3.6–11.0)

## 2017-03-06 LAB — COMPREHENSIVE METABOLIC PANEL
ALK PHOS: 92 U/L (ref 38–126)
ALT: 37 U/L (ref 14–54)
AST: 32 U/L (ref 15–41)
Albumin: 3.9 g/dL (ref 3.5–5.0)
Anion gap: 11 (ref 5–15)
BUN: 10 mg/dL (ref 6–20)
CALCIUM: 8.1 mg/dL — AB (ref 8.9–10.3)
CO2: 23 mmol/L (ref 22–32)
CREATININE: 0.66 mg/dL (ref 0.44–1.00)
Chloride: 104 mmol/L (ref 101–111)
Glucose, Bld: 159 mg/dL — ABNORMAL HIGH (ref 65–99)
Potassium: 3.3 mmol/L — ABNORMAL LOW (ref 3.5–5.1)
Sodium: 138 mmol/L (ref 135–145)
Total Bilirubin: 1.4 mg/dL — ABNORMAL HIGH (ref 0.3–1.2)
Total Protein: 7.2 g/dL (ref 6.5–8.1)

## 2017-03-06 LAB — MAGNESIUM: MAGNESIUM: 1.6 mg/dL — AB (ref 1.7–2.4)

## 2017-03-06 LAB — URINALYSIS, ROUTINE W REFLEX MICROSCOPIC
BILIRUBIN URINE: NEGATIVE
Bacteria, UA: NONE SEEN
Glucose, UA: NEGATIVE mg/dL
KETONES UR: NEGATIVE mg/dL
Leukocytes, UA: NEGATIVE
Nitrite: NEGATIVE
PROTEIN: NEGATIVE mg/dL
Specific Gravity, Urine: 1.011 (ref 1.005–1.030)
pH: 6 (ref 5.0–8.0)

## 2017-03-06 LAB — GASTROINTESTINAL PANEL BY PCR, STOOL (REPLACES STOOL CULTURE)
ADENOVIRUS F40/41: NOT DETECTED
ASTROVIRUS: NOT DETECTED
CAMPYLOBACTER SPECIES: NOT DETECTED
Cryptosporidium: NOT DETECTED
Cyclospora cayetanensis: NOT DETECTED
ENTEROPATHOGENIC E COLI (EPEC): NOT DETECTED
ENTEROTOXIGENIC E COLI (ETEC): NOT DETECTED
Entamoeba histolytica: NOT DETECTED
Enteroaggregative E coli (EAEC): NOT DETECTED
Giardia lamblia: NOT DETECTED
NOROVIRUS GI/GII: NOT DETECTED
PLESIMONAS SHIGELLOIDES: NOT DETECTED
ROTAVIRUS A: NOT DETECTED
Salmonella species: NOT DETECTED
Sapovirus (I, II, IV, and V): NOT DETECTED
Shiga like toxin producing E coli (STEC): NOT DETECTED
Shigella/Enteroinvasive E coli (EIEC): NOT DETECTED
VIBRIO SPECIES: NOT DETECTED
Vibrio cholerae: NOT DETECTED
Yersinia enterocolitica: NOT DETECTED

## 2017-03-06 LAB — C DIFFICILE QUICK SCREEN W PCR REFLEX
C DIFFICILE (CDIFF) TOXIN: POSITIVE — AB
C DIFFICLE (CDIFF) ANTIGEN: POSITIVE — AB
C Diff interpretation: DETECTED

## 2017-03-06 LAB — APTT: aPTT: 32 seconds (ref 24–36)

## 2017-03-06 LAB — LACTIC ACID, PLASMA: Lactic Acid, Venous: 1.8 mmol/L (ref 0.5–1.9)

## 2017-03-06 LAB — LIPASE, BLOOD: LIPASE: 20 U/L (ref 11–51)

## 2017-03-06 LAB — PROTIME-INR
INR: 1.07
Prothrombin Time: 13.9 seconds (ref 11.4–15.2)

## 2017-03-06 LAB — PROCALCITONIN: PROCALCITONIN: 0.29 ng/mL

## 2017-03-06 MED ORDER — VANCOMYCIN 50 MG/ML ORAL SOLUTION
125.0000 mg | Freq: Four times a day (QID) | ORAL | Status: DC
  Administered 2017-03-06 – 2017-03-10 (×16): 125 mg via ORAL
  Filled 2017-03-06 (×18): qty 2.5

## 2017-03-06 MED ORDER — PIPERACILLIN-TAZOBACTAM 3.375 G IVPB 30 MIN: 3.3750 g | Freq: Once | INTRAVENOUS | Status: DC

## 2017-03-06 MED ORDER — ACETAMINOPHEN 650 MG RE SUPP: 650.0000 mg | Freq: Four times a day (QID) | RECTAL | Status: DC | PRN

## 2017-03-06 MED ORDER — ONDANSETRON HCL 4 MG/2ML IJ SOLN
INTRAMUSCULAR | Status: AC
  Administered 2017-03-06: 4 mg via INTRAVENOUS
  Filled 2017-03-06: qty 2

## 2017-03-06 MED ORDER — ONDANSETRON HCL 4 MG/2ML IJ SOLN
4.0000 mg | Freq: Four times a day (QID) | INTRAMUSCULAR | Status: DC | PRN
  Administered 2017-03-06 – 2017-03-11 (×8): 4 mg via INTRAVENOUS
  Filled 2017-03-06 (×8): qty 2

## 2017-03-06 MED ORDER — ONDANSETRON HCL 4 MG/2ML IJ SOLN
4.0000 mg | Freq: Once | INTRAMUSCULAR | Status: AC
  Administered 2017-03-06: 4 mg via INTRAVENOUS
  Filled 2017-03-06: qty 2

## 2017-03-06 MED ORDER — VANCOMYCIN HCL IN DEXTROSE 1-5 GM/200ML-% IV SOLN: 1000.0000 mg | Freq: Once | INTRAVENOUS | Status: DC

## 2017-03-06 MED ORDER — PIPERACILLIN-TAZOBACTAM 3.375 G IVPB: 3.3750 g | Freq: Three times a day (TID) | INTRAVENOUS | Status: DC

## 2017-03-06 MED ORDER — ASPIRIN EC 81 MG PO TBEC
81.0000 mg | DELAYED_RELEASE_TABLET | Freq: Every day | ORAL | Status: DC
  Administered 2017-03-06 – 2017-03-12 (×5): 81 mg via ORAL
  Filled 2017-03-06 (×6): qty 1

## 2017-03-06 MED ORDER — ENOXAPARIN SODIUM 40 MG/0.4ML ~~LOC~~ SOLN
40.0000 mg | SUBCUTANEOUS | Status: DC
  Administered 2017-03-06 – 2017-03-11 (×6): 40 mg via SUBCUTANEOUS
  Filled 2017-03-06 (×6): qty 0.4

## 2017-03-06 MED ORDER — IOPAMIDOL (ISOVUE-300) INJECTION 61%
100.0000 mL | Freq: Once | INTRAVENOUS | Status: AC | PRN
  Administered 2017-03-06: 100 mL via INTRAVENOUS

## 2017-03-06 MED ORDER — LEVOTHYROXINE SODIUM 50 MCG PO TABS
125.0000 ug | ORAL_TABLET | Freq: Every day | ORAL | Status: DC
  Administered 2017-03-07 – 2017-03-09 (×3): 125 ug via ORAL
  Filled 2017-03-06 (×5): qty 1

## 2017-03-06 MED ORDER — SODIUM CHLORIDE 0.9 % IV SOLN
1000.0000 mL | Freq: Once | INTRAVENOUS | Status: AC
  Administered 2017-03-06: 1000 mL via INTRAVENOUS

## 2017-03-06 MED ORDER — ACETAMINOPHEN 325 MG PO TABS: 650.0000 mg | ORAL_TABLET | Freq: Four times a day (QID) | ORAL | Status: DC | PRN

## 2017-03-06 MED ORDER — IOPAMIDOL (ISOVUE-300) INJECTION 61%
30.0000 mL | Freq: Once | INTRAVENOUS | Status: AC
  Administered 2017-03-06: 30 mL via ORAL

## 2017-03-06 MED ORDER — ALBUTEROL SULFATE (2.5 MG/3ML) 0.083% IN NEBU: 2.5000 mg | INHALATION_SOLUTION | RESPIRATORY_TRACT | Status: DC | PRN

## 2017-03-06 MED ORDER — POTASSIUM CHLORIDE IN NACL 40-0.9 MEQ/L-% IV SOLN
INTRAVENOUS | Status: DC
  Administered 2017-03-06 – 2017-03-07 (×2): 75 mL/h via INTRAVENOUS
  Filled 2017-03-06 (×3): qty 1000

## 2017-03-06 MED ORDER — IOPAMIDOL (ISOVUE-300) INJECTION 61%: 30.0000 mL | Freq: Once | INTRAVENOUS | Status: DC | PRN

## 2017-03-06 MED ORDER — ONDANSETRON HCL 4 MG/2ML IJ SOLN
4.0000 mg | Freq: Once | INTRAMUSCULAR | Status: AC
  Administered 2017-03-06: 4 mg via INTRAVENOUS

## 2017-03-06 MED ORDER — ONDANSETRON HCL 4 MG PO TABS: 4.0000 mg | ORAL_TABLET | Freq: Four times a day (QID) | ORAL | Status: DC | PRN

## 2017-03-06 MED ORDER — HYDROCODONE-ACETAMINOPHEN 5-325 MG PO TABS
1.0000 | ORAL_TABLET | ORAL | Status: DC | PRN
  Administered 2017-03-06: 2 via ORAL
  Administered 2017-03-07 (×4): 1 via ORAL
  Filled 2017-03-06 (×4): qty 1
  Filled 2017-03-06: qty 2

## 2017-03-06 NOTE — ED Provider Notes (Signed)
[facility] Emergency Department Provider Note   ____________________________________________    I have reviewed the triage vital signs and the nursing notes.   HISTORY  Chief Complaint Abdominal Pain     HPI Rebecca Conner is a 66 y.o. female who presents with complaints of abdominal pain, nausea vomiting and diarrhea. She also reports chills. Patient seen in ED twice over the last couple of weeks. Initially she was seen for diarrhea and was started on Cipro Flagyl, her diarrhea resolved however she developed leg cramps which then resolved after stopping the antibiotics. Over the last 3-4 days she has felt quite well until last night she developed lower abdominal cramping, diarrhea, vomiting and chills. No recent travel. Symptoms are different than her prior episode of diarrhea because she has abdominal pain this time. She does report some frequency but no dysuria   Past Medical History:  Diagnosis Date  . Hypertension     There are no active problems to display for this patient.   Past Surgical History:  Procedure Laterality Date  . ABDOMINAL HYSTERECTOMY    . APPENDECTOMY    . CHOLECYSTECTOMY      Prior to Admission medications   Medication Sig Start Date End Date Taking? Authorizing Provider  aspirin EC 81 MG tablet Take 81 mg by mouth daily.   Yes [provider]  cyanocobalamin 1000 MCG tablet Take 1,000 mcg by mouth daily.   Yes [provider]  levothyroxine (SYNTHROID, LEVOTHROID) 125 MCG tablet Take 1 tablet by mouth daily. 12/07/11  Yes [provider]  pravastatin (PRAVACHOL) 40 MG tablet Take 40 mg by mouth daily. 08/23/12  Yes [provider]  diazepam (VALIUM) 5 MG tablet Take 1 tablet (5 mg total) by mouth every 8 (eight) hours as needed for muscle spasms. Patient not taking: Reported on 03/06/2017 02/28/17   Earleen Newport, MD  oxyCODONE-acetaminophen (ROXICET) 5-325 MG tablet Take 1  tablet by mouth every 4 (four) hours as needed for severe pain. Patient not taking: Reported on 03/06/2017 02/22/17   Gregor Hams, MD     Allergies Lisinopril; Tetanus toxoids; Ciprofloxacin; Fluticasone; Imodium [loperamide]; Lovastatin; Metronidazole; Paroxetine; Rosuvastatin calcium; and Motrin [ibuprofen]  No family history on file.  Social History Social History  Substance Use Topics  . Smoking status: Never Smoker  . Smokeless tobacco: Never Used  . Alcohol use No    Review of Systems  Constitutional: Positive chills Eyes: No visual changes.  ENT: No sore throat. Cardiovascular: Denies chest pain. Respiratory: Denies shortness of breath.No cough Gastrointestinal: As above Genitourinary: Negative for dysuria. Positive frequency Musculoskeletal: Mild right-sided back pain Skin: Negative for rash. Neurological: Negative for headaches or weakness   ____________________________________________   PHYSICAL EXAM:  VITAL SIGNS: ED Triage Vitals  Enc Vitals Group     BP 03/06/17 0735 (!) 157/82     Pulse Rate 03/06/17 0735 (!) 112     Resp 03/06/17 0735 20     Temp 03/06/17 0735 (!) 102.8 F (39.3 C)     Temp Source 03/06/17 0735 Oral     SpO2 03/06/17 0735 96 %     Weight 03/06/17 0736 81.6 kg (180 lb)     Height 03/06/17 0736 1.651 m (5\' 5" )     Head Circumference --      Peak Flow --      Pain Score 03/06/17 0742 10     Pain Loc --      Pain Edu? --  Excl. in [place]? --     Constitutional: Alert and oriented. No acute distress. Eyes: Conjunctivae are normal.   Nose: No congestion/rhinnorhea. Mouth/Throat: Mucous membranes are moist.   Neck:  Painless ROM Cardiovascular: Tachycardia, regular rhythm. Grossly normal heart sounds.  Good peripheral circulation. Respiratory: Normal respiratory effort.  No retractions. Lungs CTAB. Gastrointestinal: Minimal tenderness suprapubically. No distention.  No CVA tenderness. Genitourinary:  deferred Musculoskeletal: No lower extremity tenderness nor edema.  Warm and well perfused Neurologic:  Normal speech and language. No gross focal neurologic deficits are appreciated.  Skin:  Skin is warm, dry and intact. No rash noted. Psychiatric: Mood and affect are normal. Speech and behavior are normal.  ____________________________________________   LABS (all labs ordered are listed, but only abnormal results are displayed)  Labs Reviewed  COMPREHENSIVE METABOLIC PANEL - Abnormal; Notable for the following:       Result Value   Potassium 3.3 (*)    Glucose, Bld 159 (*)    Calcium 8.1 (*)    Total Bilirubin 1.4 (*)    All other components within normal limits  CBC WITH DIFFERENTIAL/PLATELET - Abnormal; Notable for the following:    WBC 15.8 (*)    RDW 14.7 (*)    Neutro Abs 14.5 (*)    Lymphs Abs 0.5 (*)    All other components within normal limits  URINALYSIS, ROUTINE W REFLEX MICROSCOPIC - Abnormal; Notable for the following:    Color, Urine YELLOW (*)    APPearance CLEAR (*)    Hgb urine dipstick SMALL (*)    Squamous Epithelial / LPF 0-5 (*)    Non Squamous Epithelial 0-5 (*)    All other components within normal limits  GASTROINTESTINAL PANEL BY PCR, STOOL (REPLACES STOOL CULTURE)  CULTURE, BLOOD (ROUTINE X 2)  CULTURE, BLOOD (ROUTINE X 2)  URINE CULTURE  C DIFFICILE QUICK SCREEN W PCR REFLEX  LACTIC ACID, PLASMA  LIPASE, BLOOD   ____________________________________________  EKG  None ____________________________________________  RADIOLOGY  CT abdomen pelvis unremarkable ____________________________________________   PROCEDURES  Procedure(s) performed: No    Critical Care performed: No ____________________________________________   INITIAL IMPRESSION / ASSESSMENT AND PLAN / ED COURSE  Pertinent labs & imaging results that were available during my care of the patient were reviewed by me and considered in my medical decision making (see chart  for details).  Patient presents with nausea vomiting diarrhea, she is febrile and tachycardic. This may be an infectious diarrhea versus colitis versus gastroenteritisVersus C. difficile or it may be indicative of sepsis, we will give IV fluids and Zofran while we await lab work and urinalysis  Patient with elevated white blood cell count however lactic acid is normal. I suspect viral gastroenteritis as the cause however given her lower abdominal tenderness we will need to repeat CT scan  CT is reassuring. C. difficile pending however gastrointestinal panel is reassuring, patient continues to feel nauseated and remains tachycardic after fluids, I will admit her to the hospital for further management    ____________________________________________   FINAL CLINICAL IMPRESSION(S) / ED DIAGNOSES  Final diagnoses:  Gastroenteritis      NEW MEDICATIONS STARTED DURING THIS VISIT:  New Prescriptions   No medications on file     Note:  This document was prepared using Dragon voice recognition software and may include unintentional dictation errors.     Lavonia Drafts, MD 03/06/17 1043

## 2017-03-06 NOTE — ED Triage Notes (Signed)
First Nurse Note:  Arrives with c/o N/V and abdominal pain.  Patient has been seen through ED 2 times prior for same complaints.  States symptoms returned this morning.  Patient Is AAOx3.  Skin warm and dry.  C/O nausea.

## 2017-03-06 NOTE — ED Triage Notes (Signed)
Starting approx 3 weeks ago , seen here x2 prior to today , today lower abd pain/ fever /n/v

## 2017-03-06 NOTE — H&P (Addendum)
[facility] [facility] [place] NAME: Rebecca Conner    MR#:  [id number]  DATE OF BIRTH:  April 13, 1951  DATE OF ADMISSION:  03/06/2017  PRIMARY CARE PHYSICIAN: System, Pcp Not In   REQUESTING/REFERRING PHYSICIAN: Lavonia Drafts, MD  CHIEF COMPLAINT:   Chief Complaint  Patient presents with  . Abdominal Pain   Abdominal pain, nausea, vomiting and diarrhea since yesterday. HISTORY OF PRESENT ILLNESS:  Rebecca Conner  is a 66 y.o. female with a known history of Hypertension and hypothyroidism. The patient had similar symptoms and he came to the ED 2 weeks ago. She was given Cipro and Flagyl, which she took 7 days. But she developed leg cramp pain pain which is the side effect of antibiotics. Her symptoms resoled. But she started to have lower abdominal cramping pain since yesterday, which is constant 8 out of 10 without radiation, associated with nausea, vomiting and diarrhea. She also complains of fever and chills. She denies any eating outside or leftover food. No use of contact no travel history. She was found septic with leukocytosis and tachycardia in the ED. CAT scan of abdomen is unremarkable.  PAST MEDICAL HISTORY:   Past Medical History:  Diagnosis Date  . Disease of thyroid gland   . Hypertension     PAST SURGICAL HISTORY:   Past Surgical History:  Procedure Laterality Date  . ABDOMINAL HYSTERECTOMY    . APPENDECTOMY    . BREAST CYST EXCISION    . CHOLECYSTECTOMY    . THYROID SURGERY      SOCIAL HISTORY:   Social History  Substance Use Topics  . Smoking status: Never Smoker  . Smokeless tobacco: Never Used  . Alcohol use No    FAMILY HISTORY:   Family History  Problem Relation Age of Onset  . Cancer Father   . Hypertension Father   . Stroke Father   . Heart attack Father   . Stroke Mother   . Breast cancer Sister   . Heart attack Brother   . Hypertension Brother     DRUG ALLERGIES:   Allergies  Allergen Reactions    . Lisinopril Shortness Of Breath and Palpitations  . Tetanus Toxoids Anaphylaxis  . Ciprofloxacin     Pt had muscle stiffness and pain in her knee while taking. Pt was having trouble walking.  . Fluticasone   . Imodium [Loperamide] Itching  . Lovastatin     intolerance  . Metronidazole     Pt had muscle stiffness and pain in her knee while taking. Pt was having trouble walking.   . Paroxetine Nausea Only  . Rosuvastatin Calcium Itching  . Motrin [Ibuprofen] Rash    Pt states, "It counteracts my Synthroid."    REVIEW OF SYSTEMS:   Review of Systems  Constitutional: Positive for chills, fever and malaise/fatigue.  HENT: Negative for sore throat.   Eyes: Negative for blurred vision and double vision.  Respiratory: Negative for cough, hemoptysis, shortness of breath, wheezing and stridor.   Cardiovascular: Negative for chest pain, palpitations, orthopnea and leg swelling.  Gastrointestinal: Positive for abdominal pain, nausea and vomiting. Negative for blood in stool, diarrhea and melena.  Genitourinary: Negative for dysuria, flank pain and hematuria.  Musculoskeletal: Negative for back pain and joint pain.  Skin: Negative for itching and rash.  Neurological: Positive for dizziness, weakness and headaches. Negative for sensory change, focal weakness, seizures and loss of consciousness.  Endo/Heme/Allergies: Negative for polydipsia.  Psychiatric/Behavioral: Negative for depression.  The patient is not nervous/anxious.     MEDICATIONS AT HOME:   Prior to Admission medications   Medication Sig Start Date End Date Taking? Authorizing Provider  aspirin EC 81 MG tablet Take 81 mg by mouth daily.   Yes [provider]  cyanocobalamin 1000 MCG tablet Take 1,000 mcg by mouth daily.   Yes [provider]  levothyroxine (SYNTHROID, LEVOTHROID) 125 MCG tablet Take 1 tablet by mouth daily. 12/07/11  Yes [provider]  pravastatin (PRAVACHOL) 40 MG tablet Take 40 mg  by mouth daily. 08/23/12  Yes [provider]  diazepam (VALIUM) 5 MG tablet Take 1 tablet (5 mg total) by mouth every 8 (eight) hours as needed for muscle spasms. Patient not taking: Reported on 03/06/2017 02/28/17   Earleen Newport, MD  oxyCODONE-acetaminophen (ROXICET) 5-325 MG tablet Take 1 tablet by mouth every 4 (four) hours as needed for severe pain. Patient not taking: Reported on 03/06/2017 02/22/17   Gregor Hams, MD      VITAL SIGNS:  Blood pressure 129/82, pulse (!) 110, temperature 98.8 F (37.1 C), temperature source Oral, resp. rate 18, height 5\' 5"  (1.651 m), weight 180 lb (81.6 kg), SpO2 98 %.  PHYSICAL EXAMINATION:  Physical Exam  Constitutional: She is oriented to person, place, and time and well-developed, well-nourished, and in no distress.  HENT:  Head: Normocephalic.  Mouth/Throat: Oropharynx is clear and moist.  Eyes: Pupils are equal, round, and reactive to light. Conjunctivae and EOM are normal. No scleral icterus.  Neck: Normal range of motion. Neck supple. No JVD present. No tracheal deviation present.  Cardiovascular: Normal rate, regular rhythm and normal heart sounds.  Exam reveals no gallop.   No murmur heard. Pulmonary/Chest: Effort normal and breath sounds normal. No respiratory distress. She has no wheezes. She has no rales.  Abdominal: Soft. Bowel sounds are normal. She exhibits no distension. There is tenderness. There is no rebound and no guarding.  Diffuse abdominal tenderness, worse in the lower part.  Musculoskeletal: Normal range of motion. She exhibits no edema or tenderness.  Neurological: She is alert and oriented to person, place, and time. No cranial nerve deficit.  Skin: No rash noted. No erythema.  Psychiatric: Affect normal.    LABORATORY PANEL:   CBC  Recent Labs Lab 03/06/17 0748  WBC 15.8*  HGB 13.3  HCT 39.3  PLT 300    ------------------------------------------------------------------------------------------------------------------  Chemistries   Recent Labs Lab 03/06/17 0748  NA 138  K 3.3*  CL 104  CO2 23  GLUCOSE 159*  BUN 10  CREATININE 0.66  CALCIUM 8.1*  AST 32  ALT 37  ALKPHOS 92  BILITOT 1.4*   ------------------------------------------------------------------------------------------------------------------  Cardiac Enzymes  Recent Labs Lab 02/28/17 1719  TROPONINI <0.03   ------------------------------------------------------------------------------------------------------------------  RADIOLOGY:  Ct Abdomen Pelvis W Contrast  Result Date: 03/06/2017 CLINICAL DATA:  Abdominal pain with nausea and vomiting. EXAM: CT ABDOMEN AND PELVIS WITH CONTRAST TECHNIQUE: Multidetector CT imaging of the abdomen and pelvis was performed using the standard protocol following bolus administration of intravenous contrast. CONTRAST:  175mL ISOVUE-300 IOPAMIDOL (ISOVUE-300) INJECTION 61% COMPARISON:  02/22/2017 FINDINGS: Lower chest: No acute abnormality. Hepatobiliary: No focal liver abnormality is seen. Status post cholecystectomy. No biliary dilatation. Pancreas: Unremarkable. No pancreatic ductal dilatation or surrounding inflammatory changes. Spleen: Normal in size without focal abnormality. Adrenals/Urinary Tract: Normal except for a 6 mm cyst on the lateral aspect of the upper pole of the left kidney. Stomach/Bowel: Stomach is within normal limits.  Appendix has been removed. No evidence of bowel wall thickening, distention, or inflammatory changes. Vascular/Lymphatic: No significant vascular findings are present. No enlarged abdominal or pelvic lymph nodes. Reproductive: Status post hysterectomy. No adnexal masses. Left ovary is visible and appears normal. Right ovary is not discretely identified. Other: No abdominal wall hernia or abnormality. No abdominopelvic ascites. Musculoskeletal: No acute  or significant osseous findings. IMPRESSION: Benign-appearing abdomen and pelvis. Electronically Signed   By: Lorriane Shire M.D.   On: 03/06/2017 10:22   Dg Chest Port 1 View  Result Date: 03/06/2017 CLINICAL DATA:  Acute generalized abdominal pain. EXAM: PORTABLE CHEST 1 VIEW COMPARISON:  Radiographs of July 07, 2014. FINDINGS: The heart size and mediastinal contours are within normal limits. Both lungs are clear. No pneumothorax or pleural effusion is noted. The visualized skeletal structures are unremarkable. IMPRESSION: No acute cardiopulmonary abnormality seen. Electronically Signed   By: Marijo Conception, M.D.   On: 03/06/2017 09:10      IMPRESSION AND PLAN:   Sepsis, due to acute C. difficile colitis The patient will be admitted to medical floor. Clear liquid diet if patient can tolerate. IV fluid support, Zofran when necessary and pain control when necessary. The patient is allergic to Cipro and Flagyl, unble to give this time. Start sepsis protocol. Start by mouth vancomycin. C. difficile test is positive. GI panel is negative. Follow-up CBC and blood culture.  Hypokalemia. Give potassium supplement and follow-up potassium and magnesium level.  Elevated bilirubin. Possible due to nausea and vomiting.  Hypertension. The patient is not taking any hypertension medication at home. May start HTN medication if BP is not controlled.  Hypothyroidism. Continue Synthroid.  All the records are reviewed and case discussed with ED provider. Management plans discussed with the patient, family and they are in agreement.  CODE STATUS: Full code  TOTAL TIME TAKING CARE OF THIS PATIENT:56 minutes.    Demetrios Loll M.D on 03/06/2017 at 11:29 AM  Between 7am to 6pm - Pager - [phone]  After 6pm go to [email or web address] - Proofreader  [facility]  Office  [phone]  CC: Primary care physician; System, Pcp Not In   Note: This dictation was prepared  with Dragon dictation along with smaller phrase technology. Any transcriptional errors that result from this process are unintentional.

## 2017-03-06 NOTE — ED Notes (Signed)
CODE  SEPSIS  CALLED  TO  CARELINK 

## 2017-03-07 LAB — BASIC METABOLIC PANEL
Anion gap: 7 (ref 5–15)
BUN: 15 mg/dL (ref 6–20)
CO2: 23 mmol/L (ref 22–32)
CREATININE: 1.09 mg/dL — AB (ref 0.44–1.00)
Calcium: 6.7 mg/dL — ABNORMAL LOW (ref 8.9–10.3)
Chloride: 106 mmol/L (ref 101–111)
GFR calc Af Amer: 59 mL/min — ABNORMAL LOW (ref >60)
GFR, EST NON AFRICAN AMERICAN: 51 mL/min — AB (ref >60)
Glucose, Bld: 130 mg/dL — ABNORMAL HIGH (ref 65–99)
Potassium: 3.2 mmol/L — ABNORMAL LOW (ref 3.5–5.1)
SODIUM: 136 mmol/L (ref 135–145)

## 2017-03-07 LAB — CBC
HCT: 34.6 % — ABNORMAL LOW (ref 35.0–47.0)
Hemoglobin: 11.4 g/dL — ABNORMAL LOW (ref 12.0–16.0)
MCH: 27.1 pg (ref 26.0–34.0)
MCHC: 33 g/dL (ref 32.0–36.0)
MCV: 82.2 fL (ref 80.0–100.0)
Platelets: 255 10*3/uL (ref 150–440)
RBC: 4.21 MIL/uL (ref 3.80–5.20)
RDW: 14.8 % — AB (ref 11.5–14.5)
WBC: 14.7 10*3/uL — AB (ref 3.6–11.0)

## 2017-03-07 LAB — URINE CULTURE

## 2017-03-07 MED ORDER — GI COCKTAIL ~~LOC~~
30.0000 mL | Freq: Three times a day (TID) | ORAL | Status: DC | PRN
  Administered 2017-03-07: 30 mL via ORAL
  Filled 2017-03-07 (×2): qty 30

## 2017-03-07 MED ORDER — POTASSIUM CHLORIDE IN NACL 20-0.9 MEQ/L-% IV SOLN
INTRAVENOUS | Status: DC
  Administered 2017-03-07 – 2017-03-08 (×3): via INTRAVENOUS
  Administered 2017-03-08: 1000 mL via INTRAVENOUS
  Administered 2017-03-08: 18:00:00 via INTRAVENOUS
  Administered 2017-03-09 (×2): 1000 mL via INTRAVENOUS
  Administered 2017-03-09 – 2017-03-10 (×2): via INTRAVENOUS
  Administered 2017-03-11: 1000 mL via INTRAVENOUS
  Administered 2017-03-11: 04:00:00 via INTRAVENOUS
  Filled 2017-03-07 (×17): qty 1000

## 2017-03-07 MED ORDER — MAGNESIUM SULFATE 4 GM/100ML IV SOLN
4.0000 g | Freq: Once | INTRAVENOUS | Status: AC
  Administered 2017-03-07: 4 g via INTRAVENOUS
  Filled 2017-03-07: qty 100

## 2017-03-07 MED ORDER — MAGNESIUM SULFATE 2 GM/50ML IV SOLN: 2.0000 g | Freq: Once | INTRAVENOUS | Status: DC

## 2017-03-07 MED ORDER — SUCRALFATE 1 GM/10ML PO SUSP
1.0000 g | Freq: Three times a day (TID) | ORAL | Status: DC
  Administered 2017-03-07 – 2017-03-12 (×12): 1 g via ORAL
  Filled 2017-03-07 (×13): qty 10

## 2017-03-07 NOTE — Plan of Care (Signed)
Problem: Pain Managment: Goal: General experience of comfort will improve Outcome: Progressing Pt has complained of stomach burning during my shift. Pt was given a GI cocktail and was placed on scheduled Carafate. Pt has received PRN pain medication twice during my shift.   Problem: Fluid Volume: Goal: Ability to maintain a balanced intake and output will improve Outcome: Progressing Pt has a poor appetite at this time. Pt has eaten less than 20% of all meals during my shift.

## 2017-03-07 NOTE — Progress Notes (Signed)
[place] at [place] NAME: Rebecca Conner    MR#:  [id number]  DATE OF BIRTH:  1950-08-12  SUBJECTIVE:  CHIEF COMPLAINT:   Chief Complaint  Patient presents with  . Abdominal Pain  The patient is a 66 year old Caucasian female with past medical history significant for history of hypertension as well as hypothyroidism, who presented to the hospital with complaints of nausea, vomiting and diarrhea, restarted 1 day before admission. Apparently patient was seen in the emergency room about 2 weeks ago, she was given Cipro and Flagyl, which she took for 7 days, her symptoms resolved, but now reoccurred. Pain was described as constant without radiation, associated with nausea, vomiting and diarrhea. She also complained of fevers and chills. On arrival to emergency room, she was found to be septic with leukocytosis, tachycardia. CT of abdomen and pelvis was unremarkable. Labs revealed a C. difficile infection. Patient was admitted, now on vancomycin oral solution. She feels somewhat better today, still continues to have lower abdominal pain, some nausea, but able to sip liquids.  Review of Systems  Constitutional: Negative for chills, fever and weight loss.  HENT: Negative for congestion.   Eyes: Negative for blurred vision and double vision.  Respiratory: Negative for cough, sputum production, shortness of breath and wheezing.   Cardiovascular: Negative for chest pain, palpitations, orthopnea, leg swelling and PND.  Gastrointestinal: Positive for abdominal pain and nausea. Negative for blood in stool, constipation, diarrhea and vomiting.  Genitourinary: Negative for dysuria, frequency, hematuria and urgency.  Musculoskeletal: Negative for falls.  Neurological: Negative for dizziness, tremors, focal weakness and headaches.  Endo/Heme/Allergies: Does not bruise/bleed easily.  Psychiatric/Behavioral: Negative for depression. The patient does not  have insomnia.     VITAL SIGNS: Blood pressure (!) 115/59, pulse 92, temperature 99.8 F (37.7 C), temperature source Oral, resp. rate 17, height 5\' 5"  (1.651 m), weight 81.6 kg (180 lb), SpO2 94 %.  PHYSICAL EXAMINATION:   GENERAL:  66 y.o.-year-old patient lying in the bed with no acute distress,prostrated look, dry oral mucosa, .  EYES: Pupils equal, round, reactive to light and accommodation. No scleral icterus. Extraocular muscles intact.  HEENT: Head atraumatic, normocephalic. Oropharynx and nasopharynx clear.  NECK:  Supple, no jugular venous distention. No thyroid enlargement, no tenderness.  LUNGS: Normal breath sounds bilaterally, no wheezing, rales,rhonchi or crepitation. No use of accessory muscles of respiration.  CARDIOVASCULAR: S1, S2 normal. No murmurs, rubs, or gallops.  ABDOMEN: Soft, nontender, nondistended. Bowel sounds present. No organomegaly or mass.  EXTREMITIES: No pedal edema, cyanosis, or clubbing.  NEUROLOGIC: Cranial nerves II through XII are intact. Muscle strength 5/5 in all extremities. Sensation intact. Gait not checked.  PSYCHIATRIC: The patient is alert and oriented x 3.  SKIN: No obvious rash, lesion, or ulcer.   ORDERS/RESULTS REVIEWED:   CBC  Recent Labs Lab 02/28/17 1719 03/06/17 0748 03/07/17 0517  WBC 6.9 15.8* 14.7*  HGB 13.7 13.3 11.4*  HCT 41.6 39.3 34.6*  PLT 332 300 255  MCV 83.2 81.1 82.2  MCH 27.5 27.5 27.1  MCHC 33.0 33.9 33.0  RDW 14.9* 14.7* 14.8*  LYMPHSABS  --  0.5*  --   MONOABS  --  0.8  --   EOSABS  --  0.0  --   BASOSABS  --  0.0  --    ------------------------------------------------------------------------------------------------------------------  Chemistries   Recent Labs Lab 02/28/17 1719 03/06/17 0748 03/06/17 1216 03/07/17 0517  NA 140 138  --  136  K 4.3 3.3*  --  3.2*  CL 105 104  --  106  CO2 27 23  --  23  GLUCOSE 97 159*  --  130*  BUN 11 10  --  15  CREATININE 0.79 0.66  --  1.09*  CALCIUM  8.6* 8.1*  --  6.7*  MG  --   --  1.6*  --   AST  --  32  --   --   ALT  --  37  --   --   ALKPHOS  --  92  --   --   BILITOT  --  1.4*  --   --    ------------------------------------------------------------------------------------------------------------------ estimated creatinine clearance is 52.1 mL/min (A) (by C-G formula based on SCr of 1.09 mg/dL (H)). ------------------------------------------------------------------------------------------------------------------ No results for input(s): TSH, T4TOTAL, T3FREE, THYROIDAB in the last 72 hours.  Invalid input(s): FREET3  Cardiac Enzymes  Recent Labs Lab 02/28/17 1719  TROPONINI <0.03   ------------------------------------------------------------------------------------------------------------------ Invalid input(s): POCBNP ---------------------------------------------------------------------------------------------------------------  RADIOLOGY: Ct Abdomen Pelvis W Contrast  Result Date: 03/06/2017 CLINICAL DATA:  Abdominal pain with nausea and vomiting. EXAM: CT ABDOMEN AND PELVIS WITH CONTRAST TECHNIQUE: Multidetector CT imaging of the abdomen and pelvis was performed using the standard protocol following bolus administration of intravenous contrast. CONTRAST:  167mL ISOVUE-300 IOPAMIDOL (ISOVUE-300) INJECTION 61% COMPARISON:  02/22/2017 FINDINGS: Lower chest: No acute abnormality. Hepatobiliary: No focal liver abnormality is seen. Status post cholecystectomy. No biliary dilatation. Pancreas: Unremarkable. No pancreatic ductal dilatation or surrounding inflammatory changes. Spleen: Normal in size without focal abnormality. Adrenals/Urinary Tract: Normal except for a 6 mm cyst on the lateral aspect of the upper pole of the left kidney. Stomach/Bowel: Stomach is within normal limits. Appendix has been removed. No evidence of bowel wall thickening, distention, or inflammatory changes. Vascular/Lymphatic: No significant vascular findings  are present. No enlarged abdominal or pelvic lymph nodes. Reproductive: Status post hysterectomy. No adnexal masses. Left ovary is visible and appears normal. Right ovary is not discretely identified. Other: No abdominal wall hernia or abnormality. No abdominopelvic ascites. Musculoskeletal: No acute or significant osseous findings. IMPRESSION: Benign-appearing abdomen and pelvis. Electronically Signed   By: Lorriane Shire M.D.   On: 03/06/2017 10:22   Dg Chest Port 1 View  Result Date: 03/06/2017 CLINICAL DATA:  Acute generalized abdominal pain. EXAM: PORTABLE CHEST 1 VIEW COMPARISON:  Radiographs of July 07, 2014. FINDINGS: The heart size and mediastinal contours are within normal limits. Both lungs are clear. No pneumothorax or pleural effusion is noted. The visualized skeletal structures are unremarkable. IMPRESSION: No acute cardiopulmonary abnormality seen. Electronically Signed   By: Marijo Conception, M.D.   On: 03/06/2017 09:10    EKG:  Orders placed or performed during the hospital encounter of 02/28/17  . ED EKG  . ED EKG  . EKG    ASSESSMENT AND PLAN:  Active Problems:   Sepsis ([place])  1. Sepsis due to C. difficile infection, continue patient on vancomycin orally, blood cultures are negative so far, urine culture revealed multiple species, recollection was suggested, continue advanced. IV fluids, supportive therapy, fever has subsided as well as whether cell count   #2. Leukocytosis, seems to be improving #3. Hypokalemia, supplementing intravenously, magnesium level was also found to be low, supplementing intravenously, follow potassium level in the morning #4. Hyperglycemia, check hemoglobin A1c to rule out diabetes #5 C. difficile colitis, continue patient on vancomycin orally, clear liquid diet, to be advanced as tolerated  Management plans discussed  with the patient, family and they are in agreement.   DRUG ALLERGIES:  Allergies  Allergen Reactions  . Lisinopril  Shortness Of Breath and Palpitations  . Tetanus Toxoids Anaphylaxis  . Ciprofloxacin     Pt had muscle stiffness and pain in her knee while taking. Pt was having trouble walking.  . Fluticasone   . Imodium [Loperamide] Itching  . Lovastatin     intolerance  . Metronidazole     Pt had muscle stiffness and pain in her knee while taking. Pt was having trouble walking.   . Paroxetine Nausea Only  . Rosuvastatin Calcium Itching  . Motrin [Ibuprofen] Rash    Pt states, "It counteracts my Synthroid."    CODE STATUS:     Code Status Orders        Start     Ordered   03/06/17 1208  Full code  Continuous     03/06/17 1207    Code Status History    Date Active Date Inactive Code Status Order ID Comments User Context   This patient has a current code status but no historical code status.    Advance Directive Documentation     Most Recent Value  Type of Advance Directive  Living will  Pre-existing out of facility DNR order (yellow form or pink MOST form)  -  "MOST" Form in Place?  -      TOTAL TIME TAKING CARE OF THIS PATIENT: 35 minutes.    Theodoro Grist M.D on 03/07/2017 at 12:46 PM  Between 7am to 6pm - Pager - [phone]  After 6pm go to [email or web address] - password [email or web address] [facility]  Office  [phone]  CC: Primary care physician; System, Pcp Not In

## 2017-03-08 LAB — BASIC METABOLIC PANEL
ANION GAP: 5 (ref 5–15)
BUN: 13 mg/dL (ref 6–20)
CALCIUM: 7 mg/dL — AB (ref 8.9–10.3)
CHLORIDE: 112 mmol/L — AB (ref 101–111)
CO2: 23 mmol/L (ref 22–32)
Creatinine, Ser: 0.9 mg/dL (ref 0.44–1.00)
GFR calc non Af Amer: >60 mL/min (ref >60)
GLUCOSE: 90 mg/dL (ref 65–99)
POTASSIUM: 3.7 mmol/L (ref 3.5–5.1)
Sodium: 140 mmol/L (ref 135–145)

## 2017-03-08 LAB — HEMOGLOBIN A1C
HEMOGLOBIN A1C: 6.1 % — AB (ref 4.8–5.6)
Mean Plasma Glucose: 128.37 mg/dL

## 2017-03-08 LAB — MAGNESIUM: Magnesium: 2.7 mg/dL — ABNORMAL HIGH (ref 1.7–2.4)

## 2017-03-08 MED ORDER — PROCHLORPERAZINE EDISYLATE 5 MG/ML IJ SOLN
10.0000 mg | Freq: Four times a day (QID) | INTRAMUSCULAR | Status: DC | PRN
  Administered 2017-03-08: 10 mg via INTRAVENOUS
  Filled 2017-03-08 (×2): qty 2

## 2017-03-08 MED ORDER — BOOST / RESOURCE BREEZE PO LIQD
1.0000 | Freq: Three times a day (TID) | ORAL | Status: DC
  Administered 2017-03-08 – 2017-03-12 (×8): 1 via ORAL

## 2017-03-08 NOTE — Progress Notes (Signed)
[facility] [place] NAME: Rebecca Conner    MR#:  [id number]  DATE OF BIRTH:  12/02/1950  SUBJECTIVE:  CHIEF COMPLAINT:   Chief Complaint  Patient presents with  . Abdominal Pain  The patient is a 66 year old Caucasian female with past medical history significant for history of hypertension as well as hypothyroidism, who presented to the hospital with complaints of nausea, vomiting and diarrhea, restarted 1 day before admission. Apparently patient was seen in the emergency room about 2 weeks ago, she was given Cipro and Flagyl, which she took for 7 days, her symptoms resolved, but now reoccurred. Pain was described as constant without radiation, associated with nausea, vomiting and diarrhea. She also complained of fevers and chills. On arrival to emergency room, she was found to be septic with leukocytosis, tachycardia. CT of abdomen and pelvis was unremarkable. Labs revealed a C. difficile infection. Patient was admitted, now on vancomycin oral solution. She feels somewhat better today, Complains of less abdominal pain, nausea, but able to tolerate a clear liquid diet. Stool remains liquidy but resolved with more formed than it was yesterday. Patient states that it is not as frequent as yesterday as well. .  Review of Systems  Constitutional: Negative for chills, fever and weight loss.  HENT: Negative for congestion.   Eyes: Negative for blurred vision and double vision.  Respiratory: Negative for cough, sputum production, shortness of breath and wheezing.   Cardiovascular: Negative for chest pain, palpitations, orthopnea, leg swelling and PND.  Gastrointestinal: Positive for abdominal pain, diarrhea and nausea. Negative for blood in stool, constipation and vomiting.  Genitourinary: Negative for dysuria, frequency, hematuria and urgency.  Musculoskeletal: Negative for falls.  Neurological: Negative for dizziness, tremors, focal weakness  and headaches.  Endo/Heme/Allergies: Does not bruise/bleed easily.  Psychiatric/Behavioral: Negative for depression. The patient does not have insomnia.     VITAL SIGNS: Blood pressure 135/63, pulse 73, temperature 98.4 F (36.9 C), temperature source Oral, resp. rate 20, height 5\' 5"  (1.651 m), weight 81.6 kg (180 lb), SpO2 95 %.  PHYSICAL EXAMINATION:   GENERAL:  66 y.o.-year-old patient lying in the bed with no acute distress,prostrated look, dry oral mucosa, .  EYES: Pupils equal, round, reactive to light and accommodation. No scleral icterus. Extraocular muscles intact.  HEENT: Head atraumatic, normocephalic. Oropharynx and nasopharynx clear.  NECK:  Supple, no jugular venous distention. No thyroid enlargement, no tenderness.  LUNGS: Normal breath sounds bilaterally, no wheezing, rales,rhonchi or crepitation. No use of accessory muscles of respiration.  CARDIOVASCULAR: S1, S2 normal. No murmurs, rubs, or gallops.  ABDOMEN: Soft, nontender, nondistended. Bowel sounds present. No organomegaly or mass.  EXTREMITIES: No pedal edema, cyanosis, or clubbing.  NEUROLOGIC: Cranial nerves II through XII are intact. Muscle strength 5/5 in all extremities. Sensation intact. Gait not checked.  PSYCHIATRIC: The patient is alert and oriented x 3.  SKIN: No obvious rash, lesion, or ulcer.   ORDERS/RESULTS REVIEWED:   CBC  Recent Labs Lab 03/06/17 0748 03/07/17 0517  WBC 15.8* 14.7*  HGB 13.3 11.4*  HCT 39.3 34.6*  PLT 300 255  MCV 81.1 82.2  MCH 27.5 27.1  MCHC 33.9 33.0  RDW 14.7* 14.8*  LYMPHSABS 0.5*  --   MONOABS 0.8  --   EOSABS 0.0  --   BASOSABS 0.0  --    ------------------------------------------------------------------------------------------------------------------  Chemistries   Recent Labs Lab 03/06/17 0748 03/06/17 1216 03/07/17 0517 03/08/17 0410  NA 138  --  136 140  K 3.3*  --  3.2* 3.7  CL 104  --  106 112*  CO2 23  --  23 23  GLUCOSE 159*  --  130* 90    BUN 10  --  15 13  CREATININE 0.66  --  1.09* 0.90  CALCIUM 8.1*  --  6.7* 7.0*  MG  --  1.6*  --  2.7*  AST 32  --   --   --   ALT 37  --   --   --   ALKPHOS 92  --   --   --   BILITOT 1.4*  --   --   --    ------------------------------------------------------------------------------------------------------------------ estimated creatinine clearance is 63.1 mL/min (by C-G formula based on SCr of 0.9 mg/dL). ------------------------------------------------------------------------------------------------------------------ No results for input(s): TSH, T4TOTAL, T3FREE, THYROIDAB in the last 72 hours.  Invalid input(s): FREET3  Cardiac Enzymes No results for input(s): CKMB, TROPONINI, MYOGLOBIN in the last 168 hours.  Invalid input(s): CK ------------------------------------------------------------------------------------------------------------------ Invalid input(s): POCBNP ---------------------------------------------------------------------------------------------------------------  RADIOLOGY: No results found.  EKG:  Orders placed or performed during the hospital encounter of 02/28/17  . ED EKG  . ED EKG  . EKG    ASSESSMENT AND PLAN:  Active Problems:   Sepsis ([place])  1. Sepsis due to C. difficile Colitis, continue patient on vancomycin orally, blood cultures are negative so far, urine culture revealed multiple species, recollection was suggested, continue advanced. IV fluids, supportive therapy, fever has subsided as well as white blood cell count   #2. Leukocytosis, seems to be improving, follow closely #3. Hypokalemia, supplemented intravenously, magnesium level was also found to be low, supplemented intravenously, normalized #4. Hyperglycemia, hemoglobin A1c 6.1, prediabetes #5 C. difficile colitis, continue patient on vancomycin orally, advance diet to full liquid diet, follow clinically, seems to be improving slowly  Management plans discussed with the patient,  family and they are in agreement.   DRUG ALLERGIES:  Allergies  Allergen Reactions  . Lisinopril Shortness Of Breath and Palpitations  . Tetanus Toxoids Anaphylaxis  . Ciprofloxacin     Pt had muscle stiffness and pain in her knee while taking. Pt was having trouble walking.  . Fluticasone   . Imodium [Loperamide] Itching  . Lovastatin     intolerance  . Metronidazole     Pt had muscle stiffness and pain in her knee while taking. Pt was having trouble walking.   . Paroxetine Nausea Only  . Rosuvastatin Calcium Itching  . Motrin [Ibuprofen] Rash    Pt states, "It counteracts my Synthroid."    CODE STATUS:     Code Status Orders        Start     Ordered   03/06/17 1208  Full code  Continuous     03/06/17 1207    Code Status History    Date Active Date Inactive Code Status Order ID Comments User Context   This patient has a current code status but no historical code status.    Advance Directive Documentation     Most Recent Value  Type of Advance Directive  Living will  Pre-existing out of facility DNR order (yellow form or pink MOST form)  -  "MOST" Form in Place?  -      TOTAL TIME TAKING CARE OF THIS PATIENT: 35 minutes.    Theodoro Grist M.D on 03/08/2017 at 12:02 PM  Between 7am to 6pm - Pager - [phone]  After 6pm go to [email or web address] - [place]  [facility]  Office  [phone]  CC: Primary care physician; System, Pcp Not In

## 2017-03-08 NOTE — Progress Notes (Signed)
Initial Nutrition Assessment  Late entry for 03/07/2017 at 1615 due to [facility] downtime.  DOCUMENTATION CODES:   Not applicable  INTERVENTION:  Provide Boost Breeze po TID, each supplement provides 250 kcal and 9 grams of protein.  Encouraged adequate intake of protein at meals as diet is able to be advanced.   NUTRITION DIAGNOSIS:   Inadequate oral intake related to acute illness, other (see comment) (C diff colitis, nausea, vomiting, abdominal pain) as evidenced by per patient/family report.  GOAL:   Patient will meet greater than or equal to 90% of their needs  MONITOR:   PO intake, Supplement acceptance, Diet advancement, Labs, Weight trends, I & O's  REASON FOR ASSESSMENT:   Malnutrition Screening Tool    ASSESSMENT:   66 year old female with PMHx of HTN and hypothyroidism presented with N/V and diarrhea found to have sepsis due to C. Difficile colitis.   Spoke with patient at bedside. She reports her appetite has been decreased since around July 15. She has been experiencing diarrhea, nausea, and abdominal pain. She is still attempting to eat 3 meals per day, but was eating less than usual. Patient reports the day prior to admission she was only taking bites of meals. She has been drinking water to try to stay hydrated. She typically has a good appetite.   Patient reports her UBW was 187 lbs. RD obtained weight of 180.3 lbs on bed scale. Patient has lost 6.7 lbs (3.6% body weight) over the past month, which is not significant for time frame.  Medications reviewed and include: levothyroxine, Carafate, vancomycin, NS with KCl 20 mEq/L @ 125 ml/hr, Zofran PRN.  Labs reviewed: Chloride 112, Magnesium 2.7.  Nutrition-Focused physical exam completed. Findings are no fat depletion, no muscle depletion, and no edema.   Discussed with RN.  Diet Order:  Diet full liquid Room service appropriate? Yes; Fluid consistency: Thin  Skin:  Reviewed, no issues  Last BM:   03/07/2017  Height:   Ht Readings from Last 1 Encounters:  03/06/17 5\' 5"  (1.651 m)    Weight:   Wt Readings from Last 1 Encounters:  03/06/17 180 lb (81.6 kg)    Ideal Body Weight:  56.8 kg  BMI:  Body mass index is 29.95 kg/m.  Estimated Nutritional Needs:   Kcal:  1620-1890 (MSJ x 1.2-1.4)  Protein:  82-100 grams (1-1.2 grams/kg)  Fluid:  1.7-2 L/day (30-35 ml/kg IBW)  EDUCATION NEEDS:   Education needs addressed  Willey Blade, MS, RD, LDN Pager: [phone] After Hours Pager: [phone]

## 2017-03-09 LAB — CBC
HEMATOCRIT: 34.1 % — AB (ref 35.0–47.0)
Hemoglobin: 11.3 g/dL — ABNORMAL LOW (ref 12.0–16.0)
MCH: 27.6 pg (ref 26.0–34.0)
MCHC: 33.2 g/dL (ref 32.0–36.0)
MCV: 83 fL (ref 80.0–100.0)
Platelets: 243 10*3/uL (ref 150–440)
RBC: 4.11 MIL/uL (ref 3.80–5.20)
RDW: 14.9 % — AB (ref 11.5–14.5)
WBC: 7.3 10*3/uL (ref 3.6–11.0)

## 2017-03-09 MED ORDER — RISAQUAD PO CAPS
2.0000 | ORAL_CAPSULE | Freq: Three times a day (TID) | ORAL | Status: DC
  Administered 2017-03-09 – 2017-03-12 (×3): 2 via ORAL
  Filled 2017-03-09 (×4): qty 2

## 2017-03-09 MED ORDER — TRAZODONE HCL 50 MG PO TABS
50.0000 mg | ORAL_TABLET | Freq: Every evening | ORAL | Status: DC | PRN
Start: 2017-03-09 — End: 2017-03-12
  Administered 2017-03-09 (×2): 50 mg via ORAL
  Filled 2017-03-09 (×2): qty 1

## 2017-03-09 NOTE — Care Management Important Message (Signed)
Important Message  Patient Details  Name: Steffi Noviello MRN: [id number] Date of Birth: 24-Mar-1951   Medicare Important Message Given:  Yes    Beverly Sessions, RN 03/09/2017, 4:00 PM

## 2017-03-09 NOTE — Progress Notes (Signed)
[facility] [place] NAME: Rebecca Conner    MR#:  [id number]  DATE OF BIRTH:  10/14/1950  SUBJECTIVE:  CHIEF COMPLAINT:   Chief Complaint  Patient presents with  . Abdominal Pain  The patient is a 66 year old Caucasian female with past medical history significant for history of hypertension as well as hypothyroidism, who presented to the hospital with complaints of nausea, vomiting and diarrhea, restarted 1 day before admission. Apparently patient was seen in the emergency room about 2 weeks ago, she was given Cipro and Flagyl, which she took for 7 days, her symptoms resolved, but now reoccurred. Pain was described as constant without radiation, associated with nausea, vomiting and diarrhea. She also complained of fevers and chills. On arrival to emergency room, she was found to be septic with leukocytosis, tachycardia. CT of abdomen and pelvis was unremarkable. Labs revealed a C. difficile infection. Patient was admitted, now on vancomycin oral solution.   She feels better today, Complains of less abdominal pain, able to tolerate liquid diet. Patient is reporting 5 liquidy bowel movements from today morning. Feeling weak and tired, wants to try soft diet today Review of Systems  Constitutional: Negative for chills, fever and weight loss.  HENT: Negative for congestion.   Eyes: Negative for blurred vision and double vision.  Respiratory: Negative for cough, sputum production, shortness of breath and wheezing.   Cardiovascular: Negative for chest pain, palpitations, orthopnea, leg swelling and PND.  Gastrointestinal: Positive for diarrhea. Negative for abdominal pain, blood in stool, constipation, nausea and vomiting.  Genitourinary: Negative for dysuria, frequency, hematuria and urgency.  Musculoskeletal: Negative for falls.  Neurological: Negative for dizziness, tremors, focal weakness and headaches.  Endo/Heme/Allergies: Does not  bruise/bleed easily.  Psychiatric/Behavioral: Negative for depression. The patient does not have insomnia.     VITAL SIGNS: Blood pressure (!) 157/72, pulse 73, temperature 98.3 F (36.8 C), temperature source Oral, resp. rate 19, height 5\' 5"  (1.651 m), weight 81.6 kg (180 lb), SpO2 96 %.  PHYSICAL EXAMINATION:   GENERAL:  66 y.o.-year-old patient lying in the bed with no acute distress,prostrated look, dry oral mucosa, .  EYES: Pupils equal, round, reactive to light and accommodation. No scleral icterus. Extraocular muscles intact.  HEENT: Head atraumatic, normocephalic. Oropharynx and nasopharynx clear.  NECK:  Supple, no jugular venous distention. No thyroid enlargement, no tenderness.  LUNGS: Normal breath sounds bilaterally, no wheezing, rales,rhonchi or crepitation. No use of accessory muscles of respiration.  CARDIOVASCULAR: S1, S2 normal. No murmurs, rubs, or gallops.  ABDOMEN: Soft, nontender, nondistended. Bowel sounds present. No organomegaly or mass.  EXTREMITIES: No pedal edema, cyanosis, or clubbing.  NEUROLOGIC: Cranial nerves II through XII are intact. Muscle strength 5/5 in all extremities. Sensation intact. Gait not checked.  PSYCHIATRIC: The patient is alert and oriented x 3.  SKIN: No obvious rash, lesion, or ulcer.   ORDERS/RESULTS REVIEWED:   CBC  Recent Labs Lab 03/06/17 0748 03/07/17 0517 03/09/17 0355  WBC 15.8* 14.7* 7.3  HGB 13.3 11.4* 11.3*  HCT 39.3 34.6* 34.1*  PLT 300 255 243  MCV 81.1 82.2 83.0  MCH 27.5 27.1 27.6  MCHC 33.9 33.0 33.2  RDW 14.7* 14.8* 14.9*  LYMPHSABS 0.5*  --   --   MONOABS 0.8  --   --   EOSABS 0.0  --   --   BASOSABS 0.0  --   --    ------------------------------------------------------------------------------------------------------------------  Chemistries   Recent Labs Lab  03/06/17 0748 03/06/17 1216 03/07/17 0517 03/08/17 0410  NA 138  --  136 140  K 3.3*  --  3.2* 3.7  CL 104  --  106 112*  CO2 23  --   23 23  GLUCOSE 159*  --  130* 90  BUN 10  --  15 13  CREATININE 0.66  --  1.09* 0.90  CALCIUM 8.1*  --  6.7* 7.0*  MG  --  1.6*  --  2.7*  AST 32  --   --   --   ALT 37  --   --   --   ALKPHOS 92  --   --   --   BILITOT 1.4*  --   --   --    ------------------------------------------------------------------------------------------------------------------ estimated creatinine clearance is 63.1 mL/min (by C-G formula based on SCr of 0.9 mg/dL). ------------------------------------------------------------------------------------------------------------------ No results for input(s): TSH, T4TOTAL, T3FREE, THYROIDAB in the last 72 hours.  Invalid input(s): FREET3  Cardiac Enzymes No results for input(s): CKMB, TROPONINI, MYOGLOBIN in the last 168 hours.  Invalid input(s): CK ------------------------------------------------------------------------------------------------------------------ Invalid input(s): POCBNP ---------------------------------------------------------------------------------------------------------------  RADIOLOGY: No results found.  EKG:  Orders placed or performed during the hospital encounter of 02/28/17  . ED EKG  . ED EKG  . EKG    ASSESSMENT AND PLAN:  Active Problems:   Sepsis ([place])  1. Sepsis due to C. difficile Colitis  continue patient on vancomycin orally  blood cultures are negative so far  urine culture revealed multiple species, recollection was suggested,Patient is asymptomatic Start patient on soft diet and probiotics Physical therapy Monitor number of bowel movements    #2. Leukocytosis, seems to be improving, follow closely  #3. Hypokalemia, supplemented intravenously, magnesium level was also found to be low, supplemented intravenously, normalized  #4. Hyperglycemia, hemoglobin A1c 6.1, prediabetes  #5 C. difficile colitis, continue patient on vancomycin orally, advance diet to soft diet, follow clinically, seems to be improving  slowly  Follow-up with physical therapy  Management plans discussed with the patient, family and they are in agreement.   DRUG ALLERGIES:  Allergies  Allergen Reactions  . Lisinopril Shortness Of Breath and Palpitations  . Tetanus Toxoids Anaphylaxis  . Ciprofloxacin     Pt had muscle stiffness and pain in her knee while taking. Pt was having trouble walking.  . Fluticasone   . Imodium [Loperamide] Itching  . Lovastatin     intolerance  . Metronidazole     Pt had muscle stiffness and pain in her knee while taking. Pt was having trouble walking.   . Paroxetine Nausea Only  . Rosuvastatin Calcium Itching  . Motrin [Ibuprofen] Rash    Pt states, "It counteracts my Synthroid."    CODE STATUS:     Code Status Orders        Start     Ordered   03/06/17 1208  Full code  Continuous     03/06/17 1207    Code Status History    Date Active Date Inactive Code Status Order ID Comments User Context   This patient has a current code status but no historical code status.    Advance Directive Documentation     Most Recent Value  Type of Advance Directive  Living will  Pre-existing out of facility DNR order (yellow form or pink MOST form)  -  "MOST" Form in Place?  -      TOTAL TIME TAKING CARE OF THIS PATIENT: 35 minutes.    Mikhayla Phillis  M.D on 03/09/2017 at 11:46 AM  Between 7am to 6pm - Pager - [phone]  After 6pm go to [email or web address] - password [email or web address] [facility]  Office  [phone]  CC: Primary care physician; System, Pcp Not In

## 2017-03-10 ENCOUNTER — Inpatient Hospital Stay: Payer: Medicare HMO

## 2017-03-10 DIAGNOSIS — G4440 Drug-induced headache, not elsewhere classified, not intractable: Secondary | ICD-10-CM

## 2017-03-10 MED ORDER — DIPHENHYDRAMINE HCL 50 MG/ML IJ SOLN
12.5000 mg | Freq: Four times a day (QID) | INTRAMUSCULAR | Status: DC | PRN
  Administered 2017-03-10 – 2017-03-11 (×2): 12.5 mg via INTRAVENOUS
  Filled 2017-03-10 (×2): qty 1

## 2017-03-10 MED ORDER — DIPHENHYDRAMINE HCL 25 MG PO CAPS: 25.0000 mg | ORAL_CAPSULE | Freq: Four times a day (QID) | ORAL | Status: DC | PRN

## 2017-03-10 MED ORDER — EPINEPHRINE 0.3 MG/0.3ML IJ SOAJ
0.3000 mg | INTRAMUSCULAR | Status: DC | PRN
  Filled 2017-03-10: qty 0.3

## 2017-03-10 MED ORDER — METRONIDAZOLE IN NACL 5-0.79 MG/ML-% IV SOLN
500.0000 mg | Freq: Three times a day (TID) | INTRAVENOUS | Status: DC
  Administered 2017-03-10 – 2017-03-12 (×7): 500 mg via INTRAVENOUS
  Filled 2017-03-10 (×10): qty 100

## 2017-03-10 MED ORDER — EPINEPHRINE PF 1 MG/ML IJ SOLN: 0.3000 mg | INTRAMUSCULAR | Status: DC | PRN

## 2017-03-10 MED ORDER — DIPHENHYDRAMINE HCL 50 MG/ML IJ SOLN
12.5000 mg | Freq: Once | INTRAMUSCULAR | Status: AC
  Administered 2017-03-10: 12.5 mg via INTRAVENOUS
  Filled 2017-03-10: qty 1

## 2017-03-10 MED ORDER — PANTOPRAZOLE SODIUM 40 MG IV SOLR
40.0000 mg | INTRAVENOUS | Status: DC
  Administered 2017-03-10 – 2017-03-11 (×2): 40 mg via INTRAVENOUS
  Filled 2017-03-10 (×2): qty 40

## 2017-03-10 MED ORDER — NYSTATIN 100000 UNIT/ML MT SUSP
5.0000 mL | Freq: Four times a day (QID) | OROMUCOSAL | Status: DC
  Administered 2017-03-10 – 2017-03-12 (×8): 500000 [IU] via OROMUCOSAL
  Filled 2017-03-10 (×8): qty 5

## 2017-03-10 NOTE — Progress Notes (Signed)
[facility] [place] NAME: Rebecca Conner    MR#:  [id number]  DATE OF BIRTH:  Feb 16, 1951  SUBJECTIVE:  CHIEF COMPLAINT:   Chief Complaint  Patient presents with  . Abdominal Pain  The patient is a 66 year old Caucasian female with past medical history significant for history of hypertension as well as hypothyroidism, who presented to the hospital with complaints of nausea, vomiting and diarrhea, restarted 1 day before admission. Apparently patient was seen in the emergency room about 2 weeks ago, she was given Cipro and Flagyl, which she took for 7 days, her symptoms resolved, but now reoccurred. Pain was described as constant without radiation, associated with nausea, vomiting and diarrhea. She also complained of fevers and chills. On arrival to emergency room, she was found to be septic with leukocytosis, tachycardia. CT of abdomen and pelvis was unremarkable. Labs revealed a C. difficile infection. Patient was admitted, now on vancomycin oral solution.  Pt is reporting throat swelling , headache and rt facial tingling from this am at around 9 am.     Review of Systems  Constitutional: Negative for chills, fever and weight loss.  HENT: Positive for sore throat. Negative for congestion.   Eyes: Negative for blurred vision and double vision.  Respiratory: Negative for cough, sputum production, shortness of breath and wheezing.   Cardiovascular: Negative for chest pain, palpitations, orthopnea, leg swelling and PND.  Gastrointestinal: Positive for diarrhea. Negative for abdominal pain, blood in stool, constipation, nausea and vomiting.  Genitourinary: Negative for dysuria, frequency, hematuria and urgency.  Musculoskeletal: Negative for falls.  Neurological: Positive for tingling and headaches. Negative for dizziness, tremors and focal weakness.  Endo/Heme/Allergies: Does not bruise/bleed easily.  Psychiatric/Behavioral: Negative for  depression. The patient does not have insomnia.     VITAL SIGNS: Blood pressure (!) 152/81, pulse 72, temperature 98.2 F (36.8 C), temperature source Tympanic, resp. rate 20, height 5\' 5"  (1.651 m), weight 81.6 kg (180 lb), SpO2 97 %.  PHYSICAL EXAMINATION:   GENERAL:  66 y.o.-year-old patient lying in the bed with no acute distress,prostrated look, dry oral mucosa, .  EYES: Pupils equal, round, reactive to light and accommodation. No scleral icterus. Extraocular muscles intact.  HEENT: Head atraumatic, normocephalic. Oropharynx -base of the tongue with white plaques. No edema of the tongue or lips noticed NECK:  Supple, no jugular venous distention. No thyroid enlargement, no tenderness.  LUNGS: Normal breath sounds bilaterally, no wheezing, rales,rhonchi or crepitation. No use of accessory muscles of respiration.  CARDIOVASCULAR: S1, S2 normal. No murmurs, rubs, or gallops.  ABDOMEN: Soft, nontender, nondistended. Bowel sounds present. No organomegaly or mass.  EXTREMITIES: No pedal edema, cyanosis, or clubbing.  NEUROLOGIC: Cranial nerves II through XII are intact. Muscle strength 5/5 in all extremities. Sensation intact. Gait not checked.  PSYCHIATRIC: The patient is alert and oriented x 3.  SKIN: No obvious rash, lesion, or ulcer.   ORDERS/RESULTS REVIEWED:   CBC  Recent Labs Lab 03/06/17 0748 03/07/17 0517 03/09/17 0355  WBC 15.8* 14.7* 7.3  HGB 13.3 11.4* 11.3*  HCT 39.3 34.6* 34.1*  PLT 300 255 243  MCV 81.1 82.2 83.0  MCH 27.5 27.1 27.6  MCHC 33.9 33.0 33.2  RDW 14.7* 14.8* 14.9*  LYMPHSABS 0.5*  --   --   MONOABS 0.8  --   --   EOSABS 0.0  --   --   BASOSABS 0.0  --   --    ------------------------------------------------------------------------------------------------------------------  Chemistries   Recent Labs Lab 03/06/17 0748 03/06/17 1216 03/07/17 0517 03/08/17 0410  NA 138  --  136 140  K 3.3*  --  3.2* 3.7  CL 104  --  106 112*  CO2 23  --  23  23  GLUCOSE 159*  --  130* 90  BUN 10  --  15 13  CREATININE 0.66  --  1.09* 0.90  CALCIUM 8.1*  --  6.7* 7.0*  MG  --  1.6*  --  2.7*  AST 32  --   --   --   ALT 37  --   --   --   ALKPHOS 92  --   --   --   BILITOT 1.4*  --   --   --    ------------------------------------------------------------------------------------------------------------------ estimated creatinine clearance is 63.1 mL/min (by C-G formula based on SCr of 0.9 mg/dL). ------------------------------------------------------------------------------------------------------------------ No results for input(s): TSH, T4TOTAL, T3FREE, THYROIDAB in the last 72 hours.  Invalid input(s): FREET3  Cardiac Enzymes No results for input(s): CKMB, TROPONINI, MYOGLOBIN in the last 168 hours.  Invalid input(s): CK ------------------------------------------------------------------------------------------------------------------ Invalid input(s): POCBNP ---------------------------------------------------------------------------------------------------------------  RADIOLOGY: Ct Head Wo Contrast  Result Date: 03/10/2017 CLINICAL DATA:  Mental status change. Difficulty swallowing and talking beginning 30 minutes ago. Throat swelling. EXAM: CT HEAD WITHOUT CONTRAST CT NECK WITHOUT CONTRAST TECHNIQUE: Contiguous axial images were obtained from the base of the skull through the vertex without contrast. Multidetector CT imaging of the neck was performed using the standard protocol without intravenous contrast. COMPARISON:  CT head without contrast 12/29/2013. FINDINGS: CT HEAD FINDINGS Brain: Mild white matter changes are noted bilaterally. No acute infarct, hemorrhage, or mass lesion is present. The basal ganglia are within normal limits bilaterally. The insular ribbon is normal. Brainstem and cerebellum are unremarkable. Vascular: No hyperdense vessel or unexpected calcification. Skull: The calvarium is intact. No focal lytic or blastic  lesions are present. Sinuses/Orbits: The paranasal sinuses and mastoid air cells are clear. The globes and orbits are within normal limits. CT NECK FINDINGS Pharynx and larynx: No focal mucosal or submucosal lesions are present. The nasopharynx, oropharynx, and hypopharynx are clear. The airway is preserved. Salivary glands: Submandibular and parotid glands are normal bilaterally. Thyroid: The thyroid is atrophic.  No focal lesions are present. Lymph nodes: No significant cervical adenopathy is present. Vascular: No significant vascular calcifications are evident. Mastoids and visualized paranasal sinuses: The visualized paranasal sinuses and mastoid air cells are clear. Skeleton: Endplate degenerative changes are present in the cervical spine from C3-4 through C6-7 there vertebral body heights are maintained. No focal lytic or blastic lesions are present. Upper chest: A ground-glass nodule in the right upper lobe measures 2.3 x 1.6 x 1.4 cm. The lung apices are otherwise clear. The upper mediastinum is unremarkable. IMPRESSION: 1. Stable white matter disease.  No acute intracranial abnormality. 2. 2.3 cm ground-glass nodule in the right upper lobe is worrisome for a primary bronchogenic neoplasm. Recommend CT chest with contrast and PET scan further evaluation. 3. Noncontrast CT the neck is otherwise unremarkable. No other acute or focal lesion to explain the patient's difficulty swallowing. These results will be called to the ordering clinician or representative by the Radiologist Assistant, and communication documented in the PACS or zVision Dashboard. Electronically Signed   By: San Morelle M.D.   On: 03/10/2017 11:09   Ct Soft Tissue Neck Wo Contrast  Result Date: 03/10/2017 CLINICAL DATA:  Mental status change. Difficulty swallowing and talking beginning 30  minutes ago. Throat swelling. EXAM: CT HEAD WITHOUT CONTRAST CT NECK WITHOUT CONTRAST TECHNIQUE: Contiguous axial images were obtained from  the base of the skull through the vertex without contrast. Multidetector CT imaging of the neck was performed using the standard protocol without intravenous contrast. COMPARISON:  CT head without contrast 12/29/2013. FINDINGS: CT HEAD FINDINGS Brain: Mild white matter changes are noted bilaterally. No acute infarct, hemorrhage, or mass lesion is present. The basal ganglia are within normal limits bilaterally. The insular ribbon is normal. Brainstem and cerebellum are unremarkable. Vascular: No hyperdense vessel or unexpected calcification. Skull: The calvarium is intact. No focal lytic or blastic lesions are present. Sinuses/Orbits: The paranasal sinuses and mastoid air cells are clear. The globes and orbits are within normal limits. CT NECK FINDINGS Pharynx and larynx: No focal mucosal or submucosal lesions are present. The nasopharynx, oropharynx, and hypopharynx are clear. The airway is preserved. Salivary glands: Submandibular and parotid glands are normal bilaterally. Thyroid: The thyroid is atrophic.  No focal lesions are present. Lymph nodes: No significant cervical adenopathy is present. Vascular: No significant vascular calcifications are evident. Mastoids and visualized paranasal sinuses: The visualized paranasal sinuses and mastoid air cells are clear. Skeleton: Endplate degenerative changes are present in the cervical spine from C3-4 through C6-7 there vertebral body heights are maintained. No focal lytic or blastic lesions are present. Upper chest: A ground-glass nodule in the right upper lobe measures 2.3 x 1.6 x 1.4 cm. The lung apices are otherwise clear. The upper mediastinum is unremarkable. IMPRESSION: 1. Stable white matter disease.  No acute intracranial abnormality. 2. 2.3 cm ground-glass nodule in the right upper lobe is worrisome for a primary bronchogenic neoplasm. Recommend CT chest with contrast and PET scan further evaluation. 3. Noncontrast CT the neck is otherwise unremarkable. No  other acute or focal lesion to explain the patient's difficulty swallowing. These results will be called to the ordering clinician or representative by the Radiologist Assistant, and communication documented in the PACS or zVision Dashboard. Electronically Signed   By: San Morelle M.D.   On: 03/10/2017 11:09    EKG:  Orders placed or performed during the hospital encounter of 02/28/17  . ED EKG  . ED EKG  . EKG    ASSESSMENT AND PLAN:  Active Problems:   Sepsis ([place])  1. Sepsis due to C. difficile Colitis  continue patient on vancomycin orally  blood cultures are negative so far  urine culture revealed multiple species, recollection was suggested,Patient is asymptomatic Start patient on soft diet and probiotics Physical therapy Monitor number of bowel movements    # dysphagia with headache and right facial tingling CT head and neck are negative Will get neuro checks, neurology consulted, discussed with Dr. Irish Elders on call neurologist , doesn't think it is stroke, no other stroke workup recommended Speech therapy consult, PT consult  #Right upper lobe nodule Patient needs outpatient CT chest and PET scan for further evaluation after discharge  #Thrush-oral Nystatin swish and swallow We'll get throat culture and sensitivity  #. Hypokalemia, supplemented intravenously, magnesium level was also found to be low, supplemented intravenously, normalized  #. Hyperglycemia, hemoglobin A1c 6.1, prediabetes  #5 C. difficile colitis, continue patient on vancomycin orally, advance diet to soft diet, follow clinically, seems to be improving slowly  Follow-up with physical therapy  Management plans discussed with the patient, patient's sister [phone] and they are in agreement.   DRUG ALLERGIES:  Allergies  Allergen Reactions  . Lisinopril Shortness Of Breath and  Palpitations  . Tetanus Toxoids Anaphylaxis  . Ciprofloxacin     Pt had muscle stiffness and pain  in her knee while taking. Pt was having trouble walking.  . Fluticasone   . Imodium [Loperamide] Itching  . Lovastatin     intolerance  . Metronidazole     Pt had muscle stiffness and pain in her knee while taking. Pt was having trouble walking.   . Paroxetine Nausea Only  . Rosuvastatin Calcium Itching  . Motrin [Ibuprofen] Rash    Pt states, "It counteracts my Synthroid."    CODE STATUS:     Code Status Orders        Start     Ordered   03/06/17 1208  Full code  Continuous     03/06/17 1207    Code Status History    Date Active Date Inactive Code Status Order ID Comments User Context   This patient has a current code status but no historical code status.    Advance Directive Documentation     Most Recent Value  Type of Advance Directive  Living will  Pre-existing out of facility DNR order (yellow form or pink MOST form)  -  "MOST" Form in Place?  -      TOTAL TIME TAKING CARE OF THIS PATIENT: 35 minutes.    Nicholes Mango M.D on 03/10/2017 at 12:00 PM  Between 7am to 6pm - Pager - [phone]  After 6pm go to [email or web address] - password [email or web address] [facility]  Office  [phone]  CC: Primary care physician; System, Pcp Not In

## 2017-03-10 NOTE — Consult Note (Signed)
Reason for Consult: headache and difficulty swallowing  Referring Physician: Dr. Margaretmary Eddy  CC: headache and difficulty swallowing   HPI: Rebecca Conner is an 66 y.o. female  with past medical history significant for history of hypertension as well as hypothyroidism, who presented to the hospital with complaints of nausea, vomiting and diarrhea, C. difficile infection. Patient was admitted, now on vancomycin oral solution. This AM pt started to complain of diffuse pressure like headache and difficulty swallowing along with horse voice.  No signs of rash.    Past Medical History:  Diagnosis Date  . Disease of thyroid gland   . Hypertension     Past Surgical History:  Procedure Laterality Date  . ABDOMINAL HYSTERECTOMY    . APPENDECTOMY    . BREAST CYST EXCISION    . CHOLECYSTECTOMY    . THYROID SURGERY      Family History  Problem Relation Age of Onset  . Cancer Father   . Hypertension Father   . Stroke Father   . Heart attack Father   . Stroke Mother   . Breast cancer Sister   . Heart attack Brother   . Hypertension Brother     Social History:  reports that she has never smoked. She has never used smokeless tobacco. She reports that she does not drink alcohol or use drugs.  Allergies  Allergen Reactions  . Lisinopril Shortness Of Breath and Palpitations  . Tetanus Toxoids Anaphylaxis  . Ciprofloxacin     Pt had muscle stiffness and pain in her knee while taking. Pt was having trouble walking.  . Fluticasone   . Imodium [Loperamide] Itching  . Lovastatin     intolerance  . Metronidazole     Pt had muscle stiffness and pain in her knee while taking. Pt was having trouble walking.   . Paroxetine Nausea Only  . Rosuvastatin Calcium Itching  . Motrin [Ibuprofen] Rash    Pt states, "It counteracts my Synthroid."    Medications: I have reviewed the patient's current medications.  ROS: History obtained from the patient  General ROS: negative for - chills, fatigue,  fever, night sweats, weight gain or weight loss Psychological ROS: negative for - behavioral disorder, hallucinations, memory difficulties, mood swings or suicidal ideation Ophthalmic ROS: negative for - blurry vision, double vision, eye pain or loss of vision ENT ROS: negative for - epistaxis, nasal discharge, oral lesions, sore throat, tinnitus or vertigo Allergy and Immunology ROS: negative for - hives or itchy/watery eyes Hematological and Lymphatic ROS: negative for - bleeding problems, bruising or swollen lymph nodes Endocrine ROS: negative for - galactorrhea, hair pattern changes, polydipsia/polyuria or temperature intolerance Respiratory ROS: negative for - cough, hemoptysis, shortness of breath or wheezing Cardiovascular ROS: negative for - chest pain, dyspnea on exertion, edema or irregular heartbeat Gastrointestinal ROS: negative for - abdominal pain, diarrhea, hematemesis, nausea/vomiting or stool incontinence Genito-Urinary ROS: negative for - dysuria, hematuria, incontinence or urinary frequency/urgency Musculoskeletal ROS: negative for - joint swelling or muscular weakness Neurological ROS: as noted in HPI Dermatological ROS: negative for rash and skin lesion changes  Physical Examination: Blood pressure (!) 152/81, pulse 72, temperature 98.2 F (36.8 C), temperature source Tympanic, resp. rate 20, height 5\' 5"  (1.651 m), weight 81.6 kg (180 lb), SpO2 97 %.  Neurological Examination   Mental Status: Alert, oriented, thought content appropriate.  Dysarthria and hoarseness  Cranial Nerves: II: Discs flat bilaterally; Visual fields grossly normal, pupils equal, round, reactive to light and accommodation III,IV, VI:  ptosis not present, extra-ocular motions intact bilaterally V,VII: smile symmetric, facial light touch sensation normal bilaterally VIII: hearing normal bilaterally IX,X: gag reflex present XI: bilateral shoulder shrug XII: midline tongue extension Motor: Right  : Upper extremity   5/5    Left:     Upper extremity   5/5  Lower extremity   5/5     Lower extremity   5/5 Tone and bulk:normal tone throughout; no atrophy noted Sensory: Pinprick and light touch intact throughout, bilaterally Deep Tendon Reflexes: 2+ and symmetric throughout Plantars: Right: downgoing   Left: downgoing Cerebellar: normal finger-to-nose Gait: not tested      Laboratory Studies:   Basic Metabolic Panel:  Recent Labs Lab 03/06/17 0748 03/06/17 1216 03/07/17 0517 03/08/17 0410  NA 138  --  136 140  K 3.3*  --  3.2* 3.7  CL 104  --  106 112*  CO2 23  --  23 23  GLUCOSE 159*  --  130* 90  BUN 10  --  15 13  CREATININE 0.66  --  1.09* 0.90  CALCIUM 8.1*  --  6.7* 7.0*  MG  --  1.6*  --  2.7*    Liver Function Tests:  Recent Labs Lab 03/06/17 0748  AST 32  ALT 37  ALKPHOS 92  BILITOT 1.4*  PROT 7.2  ALBUMIN 3.9    Recent Labs Lab 03/06/17 0748  LIPASE 20   No results for input(s): AMMONIA in the last 168 hours.  CBC:  Recent Labs Lab 03/06/17 0748 03/07/17 0517 03/09/17 0355  WBC 15.8* 14.7* 7.3  NEUTROABS 14.5*  --   --   HGB 13.3 11.4* 11.3*  HCT 39.3 34.6* 34.1*  MCV 81.1 82.2 83.0  PLT 300 255 243    Cardiac Enzymes: No results for input(s): CKTOTAL, CKMB, CKMBINDEX, TROPONINI in the last 168 hours.  BNP: Invalid input(s): POCBNP  CBG: No results for input(s): GLUCAP in the last 168 hours.  Microbiology: Results for orders placed or performed during the hospital encounter of 03/06/17  Blood Culture (routine x 2)     Status: None (Preliminary result)   Collection Time: 03/06/17  7:48 AM  Result Value Ref Range Status   Specimen Description BLOOD LAC  Final   Special Requests   Final    BOTTLES DRAWN AEROBIC AND ANAEROBIC Blood Culture adequate volume   Culture NO GROWTH 4 DAYS  Final   Report Status PENDING  Incomplete  Blood Culture (routine x 2)     Status: None (Preliminary result)   Collection Time: 03/06/17   7:49 AM  Result Value Ref Range Status   Specimen Description BLOOD LFOA  Final   Special Requests   Final    BOTTLES DRAWN AEROBIC AND ANAEROBIC Blood Culture adequate volume   Culture NO GROWTH 4 DAYS  Final   Report Status PENDING  Incomplete  Gastrointestinal Panel by PCR , Stool     Status: None   Collection Time: 03/06/17  7:49 AM  Result Value Ref Range Status   Campylobacter species NOT DETECTED NOT DETECTED Final   Plesimonas shigelloides NOT DETECTED NOT DETECTED Final   Salmonella species NOT DETECTED NOT DETECTED Final   Yersinia enterocolitica NOT DETECTED NOT DETECTED Final   Vibrio species NOT DETECTED NOT DETECTED Final   Vibrio cholerae NOT DETECTED NOT DETECTED Final   Enteroaggregative E coli (EAEC) NOT DETECTED NOT DETECTED Final   Enteropathogenic E coli (EPEC) NOT DETECTED NOT DETECTED Final   Enterotoxigenic  E coli (ETEC) NOT DETECTED NOT DETECTED Final   Shiga like toxin producing E coli (STEC) NOT DETECTED NOT DETECTED Final   Shigella/Enteroinvasive E coli (EIEC) NOT DETECTED NOT DETECTED Final   Cryptosporidium NOT DETECTED NOT DETECTED Final   Cyclospora cayetanensis NOT DETECTED NOT DETECTED Final   Entamoeba histolytica NOT DETECTED NOT DETECTED Final   Giardia lamblia NOT DETECTED NOT DETECTED Final   Adenovirus F40/41 NOT DETECTED NOT DETECTED Final   Astrovirus NOT DETECTED NOT DETECTED Final   Norovirus GI/GII NOT DETECTED NOT DETECTED Final   Rotavirus A NOT DETECTED NOT DETECTED Final   Sapovirus (I, II, IV, and V) NOT DETECTED NOT DETECTED Final  C difficile quick scan w PCR reflex     Status: Abnormal   Collection Time: 03/06/17  7:49 AM  Result Value Ref Range Status   C Diff antigen POSITIVE (A) NEGATIVE Final   C Diff toxin POSITIVE (A) NEGATIVE Final   C Diff interpretation Toxin producing C. difficile detected.  Final    Comment: CRITICAL RESULT CALLED TO, READ BACK BY AND VERIFIED WITH:  SHANTELLE GREENE AT 1210 03/06/17 SDR   Urine  culture     Status: Abnormal   Collection Time: 03/06/17  9:20 AM  Result Value Ref Range Status   Specimen Description URINE, RANDOM  Final   Special Requests NONE  Final   Culture MULTIPLE SPECIES PRESENT, SUGGEST RECOLLECTION (A)  Final   Report Status 03/07/2017 FINAL  Final    Coagulation Studies: No results for input(s): LABPROT, INR in the last 72 hours.  Urinalysis:  Recent Labs Lab 03/06/17 0749  COLORURINE YELLOW*  LABSPEC 1.011  PHURINE 6.0  GLUCOSEU NEGATIVE  HGBUR SMALL*  BILIRUBINUR NEGATIVE  KETONESUR NEGATIVE  PROTEINUR NEGATIVE  NITRITE NEGATIVE  LEUKOCYTESUR NEGATIVE    Lipid Panel:  No results found for: CHOL, TRIG, HDL, CHOLHDL, VLDL, LDLCALC  HgbA1C:  Lab Results  Component Value Date   HGBA1C 6.1 (H) 03/07/2017    Urine Drug Screen:  No results found for: LABOPIA, COCAINSCRNUR, LABBENZ, AMPHETMU, THCU, LABBARB  Alcohol Level: No results for input(s): ETH in the last 168 hours.  Other results: EKG: normal EKG, normal sinus rhythm, unchanged from previous tracings.  Imaging: Ct Head Wo Contrast  Result Date: 03/10/2017 CLINICAL DATA:  Mental status change. Difficulty swallowing and talking beginning 30 minutes ago. Throat swelling. EXAM: CT HEAD WITHOUT CONTRAST CT NECK WITHOUT CONTRAST TECHNIQUE: Contiguous axial images were obtained from the base of the skull through the vertex without contrast. Multidetector CT imaging of the neck was performed using the standard protocol without intravenous contrast. COMPARISON:  CT head without contrast 12/29/2013. FINDINGS: CT HEAD FINDINGS Brain: Mild white matter changes are noted bilaterally. No acute infarct, hemorrhage, or mass lesion is present. The basal ganglia are within normal limits bilaterally. The insular ribbon is normal. Brainstem and cerebellum are unremarkable. Vascular: No hyperdense vessel or unexpected calcification. Skull: The calvarium is intact. No focal lytic or blastic lesions are  present. Sinuses/Orbits: The paranasal sinuses and mastoid air cells are clear. The globes and orbits are within normal limits. CT NECK FINDINGS Pharynx and larynx: No focal mucosal or submucosal lesions are present. The nasopharynx, oropharynx, and hypopharynx are clear. The airway is preserved. Salivary glands: Submandibular and parotid glands are normal bilaterally. Thyroid: The thyroid is atrophic.  No focal lesions are present. Lymph nodes: No significant cervical adenopathy is present. Vascular: No significant vascular calcifications are evident. Mastoids and visualized paranasal sinuses: The  visualized paranasal sinuses and mastoid air cells are clear. Skeleton: Endplate degenerative changes are present in the cervical spine from C3-4 through C6-7 there vertebral body heights are maintained. No focal lytic or blastic lesions are present. Upper chest: A ground-glass nodule in the right upper lobe measures 2.3 x 1.6 x 1.4 cm. The lung apices are otherwise clear. The upper mediastinum is unremarkable. IMPRESSION: 1. Stable white matter disease.  No acute intracranial abnormality. 2. 2.3 cm ground-glass nodule in the right upper lobe is worrisome for a primary bronchogenic neoplasm. Recommend CT chest with contrast and PET scan further evaluation. 3. Noncontrast CT the neck is otherwise unremarkable. No other acute or focal lesion to explain the patient's difficulty swallowing. These results will be called to the ordering clinician or representative by the Radiologist Assistant, and communication documented in the PACS or zVision Dashboard. Electronically Signed   By: San Morelle M.D.   On: 03/10/2017 11:09   Ct Soft Tissue Neck Wo Contrast  Result Date: 03/10/2017 CLINICAL DATA:  Mental status change. Difficulty swallowing and talking beginning 30 minutes ago. Throat swelling. EXAM: CT HEAD WITHOUT CONTRAST CT NECK WITHOUT CONTRAST TECHNIQUE: Contiguous axial images were obtained from the base of  the skull through the vertex without contrast. Multidetector CT imaging of the neck was performed using the standard protocol without intravenous contrast. COMPARISON:  CT head without contrast 12/29/2013. FINDINGS: CT HEAD FINDINGS Brain: Mild white matter changes are noted bilaterally. No acute infarct, hemorrhage, or mass lesion is present. The basal ganglia are within normal limits bilaterally. The insular ribbon is normal. Brainstem and cerebellum are unremarkable. Vascular: No hyperdense vessel or unexpected calcification. Skull: The calvarium is intact. No focal lytic or blastic lesions are present. Sinuses/Orbits: The paranasal sinuses and mastoid air cells are clear. The globes and orbits are within normal limits. CT NECK FINDINGS Pharynx and larynx: No focal mucosal or submucosal lesions are present. The nasopharynx, oropharynx, and hypopharynx are clear. The airway is preserved. Salivary glands: Submandibular and parotid glands are normal bilaterally. Thyroid: The thyroid is atrophic.  No focal lesions are present. Lymph nodes: No significant cervical adenopathy is present. Vascular: No significant vascular calcifications are evident. Mastoids and visualized paranasal sinuses: The visualized paranasal sinuses and mastoid air cells are clear. Skeleton: Endplate degenerative changes are present in the cervical spine from C3-4 through C6-7 there vertebral body heights are maintained. No focal lytic or blastic lesions are present. Upper chest: A ground-glass nodule in the right upper lobe measures 2.3 x 1.6 x 1.4 cm. The lung apices are otherwise clear. The upper mediastinum is unremarkable. IMPRESSION: 1. Stable white matter disease.  No acute intracranial abnormality. 2. 2.3 cm ground-glass nodule in the right upper lobe is worrisome for a primary bronchogenic neoplasm. Recommend CT chest with contrast and PET scan further evaluation. 3. Noncontrast CT the neck is otherwise unremarkable. No other acute or  focal lesion to explain the patient's difficulty swallowing. These results will be called to the ordering clinician or representative by the Radiologist Assistant, and communication documented in the PACS or zVision Dashboard. Electronically Signed   By: San Morelle M.D.   On: 03/10/2017 11:09     Assessment/Plan:  66 y.o. female  with past medical history significant for history of hypertension as well as hypothyroidism, who presented to the hospital with complaints of nausea, vomiting and diarrhea, C. difficile infection. Patient was admitted, now on vancomycin oral solution. This AM pt started to complain of diffuse pressure like headache  and difficulty swallowing along with horse voice.  No signs of rash  - Headache improved currently 3/10. CTH and CT neck soft tissue no abnormalities - this is likely allergic reaction - would use PRN Benadryl - Possibly switch PO vanc to IV flagyl - No need for any further imaging from neuro stand point - Swallowing and hoarseness is improving as well after 12.5mg  benadryl IV 03/10/2017, 12:22 PM

## 2017-03-10 NOTE — Evaluation (Signed)
Physical Therapy Evaluation Patient Details Name: Rebecca Conner MRN: [id number] DOB: [birth year] Today's Date: 03/10/2017   History of Present Illness  66 yo female with onset of abd pain and nausea, vomiting, AMS and lung nodule noted in imaging was admitted for C-diff. Also having sore throat and difficulty swallowing, septic and had tachycardia.  PMHx:  hypothyroid, HTN,   Clinical Impression  Pt is having a difficult time with her generalized weakness, concern about getting back to work and her voice is very raspy and difficult to understand.  Nursing and MD have noted a yeast like coating on her tongue, may be source of symptoms.  Her plan is to follow acutely and if possible get her home instead of SNF.  Her expectation is that she has no family or friends who can help to stay with her.    Follow Up Recommendations SNF    Equipment Recommendations  None recommended by PT    Recommendations for Other Services       Precautions / Restrictions Precautions Precautions: Fall Restrictions Weight Bearing Restrictions: No      Mobility  Bed Mobility Overal bed mobility: Needs Assistance Bed Mobility: Supine to Sit     Supine to sit: Min guard;Min assist     General bed mobility comments: assisted trunk and help to finish scooting out to EOB  Transfers Overall transfer level: Needs assistance Equipment used: Rolling walker (2 wheeled) Transfers: Sit to/from Stand Sit to Stand: Min assist         General transfer comment: assisted with hand placement both to stand and sit, help to power up to stand  Ambulation/Gait Ambulation/Gait assistance: Min assist Ambulation Distance (Feet): 18 Feet Assistive device: Rolling walker (2 wheeled);1 person hand held assist Gait Pattern/deviations: Step-through pattern;Decreased stride length;Wide base of support;Shuffle;Trunk flexed Gait velocity: reduced Gait velocity interpretation: Below normal speed for age/gender General  Gait Details: pt is shaky on her feet, looks buckly in appearance with her knees  Stairs            Wheelchair Mobility    Modified Rankin (Stroke Patients Only)       Balance Overall balance assessment: Needs assistance Sitting-balance support: Feet supported Sitting balance-Leahy Scale: Fair     Standing balance support: Bilateral upper extremity supported;During functional activity Standing balance-Leahy Scale: Poor                               Pertinent Vitals/Pain Pain Assessment: No/denies pain    Home Living Family/patient expects to be discharged to:: Private residence Living Arrangements: Alone   Type of Home: House Home Access: Stairs to enter Entrance Stairs-Rails: Right;Left;Can reach both Technical brewer of Steps: 3 Home Layout: One level Home Equipment: None      Prior Function Level of Independence: Independent         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     Hand Dominance   Dominant Hand: Right    Extremity/Trunk Assessment   Upper Extremity Assessment Upper Extremity Assessment: Overall WFL for tasks assessed    Lower Extremity Assessment Lower Extremity Assessment: Generalized weakness    Cervical / Trunk Assessment Cervical / Trunk Assessment: Normal  Communication   Communication: Expressive difficulties  Cognition Arousal/Alertness: Awake/alert Behavior During Therapy: WFL for tasks assessed/performed Overall Cognitive Status: Within Functional Limits for tasks assessed  General Comments General comments (skin integrity, edema, etc.): Pt is getting up to walk wiht PT assistance but noted her help at home is not there, cannot safely walk without fully attended use of RW    Exercises     Assessment/Plan    PT Assessment Patient needs continued PT services  PT Problem List Decreased strength;Decreased range of motion;Decreased activity  tolerance;Decreased balance;Decreased mobility;Decreased coordination;Decreased knowledge of use of DME;Decreased safety awareness;Decreased skin integrity       PT Treatment Interventions DME instruction;Gait training;Stair training;Functional mobility training;Therapeutic activities;Therapeutic exercise;Balance training;Neuromuscular re-education;Patient/family education    PT Goals (Current goals can be found in the Care Plan section)  Acute Rehab PT Goals Patient Stated Goal: to have her strength back PT Goal Formulation: With patient Time For Goal Achievement: 03/24/17 Potential to Achieve Goals: Good    Frequency Min 2X/week   Barriers to discharge Decreased caregiver support;Inaccessible home environment has a stair entrance with no assistance at home    Co-evaluation               AM-PAC PT "6 Clicks" Daily Activity  Outcome Measure Difficulty turning over in bed (including adjusting bedclothes, sheets and blankets)?: A Little Difficulty moving from lying on back to sitting on the side of the bed? : Total Difficulty sitting down on and standing up from a chair with arms (e.g., wheelchair, bedside commode, etc,.)?: Total Help needed moving to and from a bed to chair (including a wheelchair)?: A Little Help needed walking in hospital room?: A Little Help needed climbing 3-5 steps with a railing? : A Little 6 Click Score: 14    End of Session Equipment Utilized During Treatment: Gait belt Activity Tolerance: Patient limited by fatigue Patient left: in chair;with call bell/phone within reach;with chair alarm set Nurse Communication: Mobility status PT Visit Diagnosis: Unsteadiness on feet (R26.81);Muscle weakness (generalized) (M62.81);Difficulty in walking, not elsewhere classified (R26.2);Adult, failure to thrive (R62.7)    Time: 3149-7026 PT Time Calculation (min) (ACUTE ONLY): 32 min   Charges:   PT Evaluation $PT Eval Moderate Complexity: 1 Mod PT  Treatments $Gait Training: 8-22 mins   PT G Codes:   PT G-Codes **NOT FOR INPATIENT CLASS** Functional Assessment Tool Used: AM-PAC 6 Clicks Basic Mobility   Ramond Dial 03/10/2017, 5:13 PM   Mee Hives, PT MS Acute Rehab Dept. Number: [place] [facility] [place]

## 2017-03-10 NOTE — Progress Notes (Addendum)
Pt failed bedside swallow eval. She was unable to swallow the water via cup, and had to spit it out. She is also now complaining of a headache. Dr. Margaretmary Eddy paged awaiting page back.   Update 0908: Dr. Margaretmary Eddy stated she will come to assess pt and determine if CT of head is necessary.

## 2017-03-10 NOTE — Progress Notes (Signed)
Pt called me and asked me to come and see her. I came to the room and pt stated that she feels like her throat is swelling and she is unable to swallow her secretions. VS: 166/89, 73, 97% on RA, 98.2 oral. Upper LS are clear but diminished and Lower lung sounds are diminished with fine crackles. Pt denies feeling as though her tongue/lips are swelling. Denies any numbness or tingling to tongue as well. Dr. Margaretmary Eddy called and she asked that I perform a bedside swallow eval and to order a SLP consult if pt is unable to pass the bedside eval. She also asked me to order PRN PO benadryl. Orders placed in epic.

## 2017-03-10 NOTE — Evaluation (Signed)
Clinical/Bedside Swallow Evaluation Patient Details  Name: Greg Eckrich MRN: [id number] Date of Birth: [birth year]  Today's Date: 03/10/2017 Time: SLP Start Time (ACUTE ONLY): 1120 SLP Stop Time (ACUTE ONLY): 1220 SLP Time Calculation (min) (ACUTE ONLY): 60 min  Past Medical History:  Past Medical History:  Diagnosis Date  . Disease of thyroid gland   . Hypertension    Past Surgical History:  Past Surgical History:  Procedure Laterality Date  . ABDOMINAL HYSTERECTOMY    . APPENDECTOMY    . BREAST CYST EXCISION    . CHOLECYSTECTOMY    . THYROID SURGERY     HPI:  Pt is an 66 y.o. female  with past medical history significant for hypertension as well as hypothyroidism who presented to the hospital with complaints of nausea, vomiting and diarrhea, C. difficile infection. Patient was admitted, now on vancomycin oral solution. This AM pt started to complain of diffuse pressure like headache and difficulty swallowing even her saliva along with hoarse voice. patient had similar symptoms and he came to the ED 2 weeks ago. She was given Cipro and Flagyl, which she took 7 days. But she developed leg cramp pain pain which is the side effect of antibiotics. Her symptoms resoled. But she started to have lower abdominal cramping pain since yesterday, which is constant 8 out of 10 without radiation, associated with nausea, vomiting and diarrhea. Pt has been on multiple Antibiotics for ~1 month per her report. She currently describes sore throat w/ pain and inability to swallow her saliva or anything to eat/drink. Pt has had a CT of the head and Noncontrast CT the neck is otherwise unremarkable. No other acute or focal lesion to explain the patient's difficulty swallowing per report. Pt is verbally conversive w/ dysphonia; A/O x4.    Assessment / Plan / Recommendation Clinical Impression  Pt appears to exhibit adequate oral phase function of swallowing w/ unknown pharyngeal phase results of swallow as  pt was unable to complete the pharyngeal swallow of 1 ice chip trial. Pt allowed the ice chip to melt somewhat then attempted to initiate a pharyngeal swallow. She felt she could not complete the swallow then expectorated the ice chip water; this was followed by a delayed cough. Pt declined further trials. No oral phase deficits noted and pt seemed to want to swallow the trial. During the oral exam, SLP noted possible min coating on mid-posterior tongue; unsure if tongue is edematous, however, pt feels it is "full" in her throat. Pt has been on multiple Antibiotics for ~1 month d/t GI issues. Consulted MD re: eval results and pt's inability to swallow at this time. Requested tx of possible Thrush to hopefully make an impact to improve ease of swallowing. MD agreed. NSG educated on oral care and potential need for IV form of meds. ST services will f/u w/ pt's status next 1-2 days. Pt is recommended to remain NPO at this time d/t increased risk for aspiration/choking. SLP Visit Diagnosis: Dysphagia, pharyngeal phase (R13.13)    Aspiration Risk  Mild aspiration risk    Diet Recommendation  NPO at this time w/ frequent oral care daily; oral swish and swallow rinses as able to tx  Medication Administration: Via alternative means    Other  Recommendations Recommended Consults: Consider ENT evaluation (TBD) Oral Care Recommendations: Oral care QID;Patient independent with oral care;Staff/trained caregiver to provide oral care   Follow up Recommendations  (TBD)      Frequency and Duration min 3x week  2  weeks       Prognosis Prognosis for Safe Diet Advancement: Fair Barriers to Reach Goals:  (possible Thrush)      Swallow Study   General Date of Onset: 03/06/17 HPI: Pt is an 66 y.o. female  with past medical history significant for hypertension as well as hypothyroidism who presented to the hospital with complaints of nausea, vomiting and diarrhea, C. difficile infection. Patient was admitted,  now on vancomycin oral solution. This AM pt started to complain of diffuse pressure like headache and difficulty swallowing even her saliva along with hoarse voice. patient had similar symptoms and he came to the ED 2 weeks ago. She was given Cipro and Flagyl, which she took 7 days. But she developed leg cramp pain pain which is the side effect of antibiotics. Her symptoms resoled. But she started to have lower abdominal cramping pain since yesterday, which is constant 8 out of 10 without radiation, associated with nausea, vomiting and diarrhea. Pt has been on multiple Antibiotics for ~1 month per her report. She currently describes sore throat w/ pain and inability to swallow her saliva or anything to eat/drink. Pt has had a CT of the head and Noncontrast CT the neck is otherwise unremarkable. No other acute or focal lesion to explain the patient's difficulty swallowing per report. Pt is verbally conversive w/ dysphonia; A/O x4.  Type of Study: Bedside Swallow Evaluation Previous Swallow Assessment: none reported Diet Prior to this Study: Regular;Thin liquids (at home - denied any deficits w/ such) Temperature Spikes Noted: No (wbc 7.3) Respiratory Status: Room air History of Recent Intubation: No Behavior/Cognition: Alert;Cooperative;Pleasant mood Oral Cavity Assessment: Excessive secretions (saliva) Oral Care Completed by SLP: Recent completion by staff Oral Cavity - Dentition: Missing dentition (few front lower dentition - dentures at home) Vision: Functional for self-feeding Self-Feeding Abilities: Able to feed self;Needs set up (in bed) Patient Positioning: Upright in bed Baseline Vocal Quality: Hoarse (dysphonia) Volitional Cough: Strong Volitional Swallow: Able to elicit    Oral/Motor/Sensory Function Overall Oral Motor/Sensory Function: Within functional limits   Ice Chips Ice chips: Impaired Presentation: Spoon (fed; 1 trial) Oral Phase Impairments:  (none) Oral Phase Functional  Implications:  (none) Pharyngeal Phase Impairments: Cough - Delayed (felt she could not swallow it; expectorated ice chip trial) Other Comments: would not take further   Thin Liquid Thin Liquid: Not tested    Nectar Thick Nectar Thick Liquid: Not tested   Honey Thick Honey Thick Liquid: Not tested   Puree Puree: Not tested   Solid   GO   Solid: Not tested         Orinda Kenner, MS, CCC-SLP Halyn Flaugher 03/10/2017,3:15 PM

## 2017-03-10 NOTE — Progress Notes (Signed)
Pt c/o nausea and reporting emesis.  Medicated w/zofran 4mg  IV per prn order.  Educated pt that additional antiemetics are available if zofran ineffective.

## 2017-03-10 NOTE — Plan of Care (Signed)
Problem: Education: Goal: Knowledge of [facility] Education information/materials will improve Outcome: Progressing Pt states she doesn't have a PCP she can get to since hers moved to [place].  Explained to her about Case Management and referral made to help her get the care she needs once discharged.  Problem: Pain Managment: Goal: General experience of comfort will improve Outcome: Progressing Having less diarrhea, however experienced some N/V early this morning.  Problem: Physical Regulation: Goal: Will remain free from infection Outcome: Progressing C-diff symptoms improving.

## 2017-03-10 NOTE — Consult Note (Signed)
Rebecca Conner, Rebecca Conner [id number] [birth year] Rebecca Nearing, MD  Reason for Consult: throat swelling, hoarseness, dysphagia Requesting Physician: Nicholes Mango, MD Consulting Physician: Rebecca Conner  HPI: This 66 y.o. year old female was admitted on 03/06/2017 for Gastroenteritis [K52.9]. She presented to the hospital with complaints of nausea, vomiting and diarrhea, C. difficile infection, having . Patient was admitted, now on vancomycin oral solution. This AM pt started to complain of diffuse pressure like headache and difficulty swallowing along with horse voice.  No signs of rash. There was a question as to whether might be an allergic reaction, despite the lack of rash or itching, so benadryl was given and provided slight relief, but still not swallowing. Denies severe throat pain. SLP exam was limited as she would not attempt to swallow ice chips, but noted whitish coating on tongue and suggested treatment for possible thrush.   Allergies:  Allergies  Allergen Reactions  . Lisinopril Shortness Of Breath and Palpitations  . Tetanus Toxoids Anaphylaxis  . Ciprofloxacin     Pt had muscle stiffness and pain in her knee while taking. Pt was having trouble walking.  . Fluticasone   . Imodium [Loperamide] Itching  . Lovastatin     intolerance  . Metronidazole     Pt had muscle stiffness and pain in her knee while taking. Pt was having trouble walking.   . Paroxetine Nausea Only  . Rosuvastatin Calcium Itching  . Motrin [Ibuprofen] Rash    Pt states, "It counteracts my Synthroid."    Medications:  Medications Prior to Admission  Medication Sig Dispense Refill  . aspirin EC 81 MG tablet Take 81 mg by mouth daily.    . cyanocobalamin 1000 MCG tablet Take 1,000 mcg by mouth daily.    Marland Kitchen levothyroxine (SYNTHROID, LEVOTHROID) 125 MCG tablet Take 1 tablet by mouth daily.    . pravastatin (PRAVACHOL) 40 MG tablet Take 40 mg by mouth daily.    . diazepam (VALIUM) 5 MG tablet Take 1 tablet (5 mg  total) by mouth every 8 (eight) hours as needed for muscle spasms. (Patient not taking: Reported on 03/06/2017) 20 tablet 0  . oxyCODONE-acetaminophen (ROXICET) 5-325 MG tablet Take 1 tablet by mouth every 4 (four) hours as needed for severe pain. (Patient not taking: Reported on 03/06/2017) 20 tablet 0  .  Current Facility-Administered Medications  Medication Dose Route Frequency Provider Last Rate Last Dose  . 0.9 % NaCl with KCl 20 mEq/ L  infusion   Intravenous Continuous Gouru, Aruna, MD 75 mL/hr at 03/10/17 1207    . acetaminophen (TYLENOL) tablet 650 mg  650 mg Oral Q6H PRN Demetrios Loll, MD       Or  . acetaminophen (TYLENOL) suppository 650 mg  650 mg Rectal Q6H PRN Demetrios Loll, MD      . acidophilus (RISAQUAD) capsule 2 capsule  2 capsule Oral TID Nicholes Mango, MD   2 capsule at 03/09/17 2019  . albuterol (PROVENTIL) (2.5 MG/3ML) 0.083% nebulizer solution 2.5 mg  2.5 mg Nebulization Q2H PRN Demetrios Loll, MD      . aspirin EC tablet 81 mg  81 mg Oral Daily Demetrios Loll, MD   81 mg at 03/09/17 1039  . diphenhydrAMINE (BENADRYL) injection 12.5 mg  12.5 mg Intravenous Q6H PRN Gouru, Aruna, MD      . enoxaparin (LOVENOX) injection 40 mg  40 mg Subcutaneous Q24H Demetrios Loll, MD   40 mg at 03/09/17 2020  . EPINEPHrine (ADRENALIN) 0.3 mg  0.3 mg  Intramuscular PRN Gouru, Aruna, MD      . feeding supplement (BOOST / RESOURCE BREEZE) liquid 1 Container  1 Container Oral TID BM Theodoro Grist, MD   1 Container at 03/09/17 2018  . gi cocktail (Maalox,Lidocaine,Donnatal)  30 mL Oral TID PRN Theodoro Grist, MD   30 mL at 03/07/17 0934  . HYDROcodone-acetaminophen (NORCO/VICODIN) 5-325 MG per tablet 1-2 tablet  1-2 tablet Oral Q4H PRN Demetrios Loll, MD   1 tablet at 03/07/17 2131  . iopamidol (ISOVUE-300) 61 % injection 30 mL  30 mL Oral Once PRN Lavonia Drafts, MD      . levothyroxine (SYNTHROID, LEVOTHROID) tablet 125 mcg  125 mcg Oral QAC breakfast Demetrios Loll, MD   125 mcg at 03/09/17 0755  . metroNIDAZOLE (FLAGYL)  IVPB 500 mg  500 mg Intravenous Q8H Gouru, Aruna, MD   Stopped at 03/10/17 1520  . nystatin (MYCOSTATIN) 100000 UNIT/ML suspension 500,000 Units  5 mL Mouth/Throat QID Gouru, Illene Silver, MD   500,000 Units at 03/10/17 1420  . ondansetron (ZOFRAN) tablet 4 mg  4 mg Oral Q6H PRN Demetrios Loll, MD       Or  . ondansetron [facility]) injection 4 mg  4 mg Intravenous Q6H PRN Demetrios Loll, MD   4 mg at 03/10/17 0130  . prochlorperazine (COMPAZINE) injection 10 mg  10 mg Intravenous Q6H PRN Harrie Foreman, MD   10 mg at 03/08/17 0511  . sucralfate (CARAFATE) 1 GM/10ML suspension 1 g  1 g Oral TID WC & HS Theodoro Grist, MD   1 g at 03/09/17 2019  . traZODone (DESYREL) tablet 50 mg  50 mg Oral QHS PRN Lance Coon, MD   50 mg at 03/09/17 2020    PMH:  Past Medical History:  Diagnosis Date  . Disease of thyroid gland   . Hypertension     Fam Hx:  Family History  Problem Relation Age of Onset  . Cancer Father   . Hypertension Father   . Stroke Father   . Heart attack Father   . Stroke Mother   . Breast cancer Sister   . Heart attack Brother   . Hypertension Brother     Soc Hx:  Social History   Social History  . Marital status: Single    Spouse name: N/A  . Number of children: N/A  . Years of education: N/A   Occupational History  . Not on file.   Social History Main Topics  . Smoking status: Never Smoker  . Smokeless tobacco: Never Used  . Alcohol use No  . Drug use: No  . Sexual activity: Not on file   Other Topics Concern  . Not on file   Social History Narrative  . No narrative on file    PSH:  Past Surgical History:  Procedure Laterality Date  . ABDOMINAL HYSTERECTOMY    . APPENDECTOMY    . BREAST CYST EXCISION    . CHOLECYSTECTOMY    . THYROID SURGERY    . Procedures since admission: No admission procedures for hospital encounter.  ROS: Review of systems normal other than 12 systems except per HPI.  PHYSICAL EXAM Vitals:  Vitals:   03/10/17 1408 03/10/17  1544  BP: (!) 182/79 (!) 155/70  Pulse: 62 67  Resp:    Temp: 98.6 F (37 C)   SpO2: 99%   . General: Well-developed, Well-nourished in no acute distress Mood: Mood and affect well adjusted, pleasant and cooperative. Orientation: Grossly alert and oriented.  Vocal Quality: No hoarseness. Communicates verbally. head and Face: NCAT. No facial asymmetry. No visible skin lesions. No significant facial scars. No tenderness with sinus percussion. Facial strength normal and symmetric. Ears: External ears with normal landmarks, no lesions. External auditory canals free of infection, cerumen impaction or lesions. Tympanic membranes intact with good landmarks and normal mobility on pneumatic otoscopy. No middle ear effusion. Hearing: Speech reception grossly normal. Nose: External nose normal with midline dorsum and no lesions or deformity. Nasal Cavity reveals septum deviated to right with normal inferior turbinates. Mild mucosal congestion without erythema. Nasal secretions are minimal and clear. No polyps seen on anterior rhinoscopy. Oral Cavity/ Oropharynx: Lips are normal with no lesions. Teeth no frank dental caries. Gingiva healthy with no lesions or gingivitis. Oropharynx shows mild erythema of posterior pharynx without significant edema and no exudate or lesion. Pharynx has normal symmetry and hydration. The tongue does have a thick white coating possibly refecting mild thrush. Indirect Laryngoscopy/Nasopharyngoscopy: Visualization of the larynx, hypopharynx and nasopharynx is not possible in this setting with routine examination. Neck: Supple and symmetric with no palpable masses, tenderness or crepitance. The trachea is midline. Thyroid gland is soft, nontender and symmetric with no masses or enlargement. Parotid and submandibular glands are soft, nontender and symmetric, without masses. Lymphatic: Cervical lymph nodes are without palpable lymphadenopathy or tenderness. Respiratory: Normal  respiratory effort without labored breathing. Cardiovascular: Carotid pulse shows regular rate and rhythm Neurologic: Cranial Nerves II through XII are grossly intact. Eyes: Gaze and Ocular Motility are grossly normal. PERRLA. No visible nystagmus.  MEDICAL DECISION MAKING: Data Review:  Results for orders placed or performed during the hospital encounter of 03/06/17 (from the past 48 hour(s))  CBC     Status: Abnormal   Collection Time: 03/09/17  3:55 AM  Result Value Ref Range   WBC 7.3 3.6 - 11.0 K/uL   RBC 4.11 3.80 - 5.20 MIL/uL   Hemoglobin 11.3 (L) 12.0 - 16.0 g/dL   HCT 34.1 (L) 35.0 - 47.0 %   MCV 83.0 80.0 - 100.0 fL   MCH 27.6 26.0 - 34.0 pg   MCHC 33.2 32.0 - 36.0 g/dL   RDW 14.9 (H) 11.5 - 14.5 %   Platelets 243 150 - 440 K/uL  . Ct Head Wo Contrast  Result Date: 03/10/2017 CLINICAL DATA:  Mental status change. Difficulty swallowing and talking beginning 30 minutes ago. Throat swelling. EXAM: CT HEAD WITHOUT CONTRAST CT NECK WITHOUT CONTRAST TECHNIQUE: Contiguous axial images were obtained from the base of the skull through the vertex without contrast. Multidetector CT imaging of the neck was performed using the standard protocol without intravenous contrast. COMPARISON:  CT head without contrast 12/29/2013. FINDINGS: CT HEAD FINDINGS Brain: Mild white matter changes are noted bilaterally. No acute infarct, hemorrhage, or mass lesion is present. The basal ganglia are within normal limits bilaterally. The insular ribbon is normal. Brainstem and cerebellum are unremarkable. Vascular: No hyperdense vessel or unexpected calcification. Skull: The calvarium is intact. No focal lytic or blastic lesions are present. Sinuses/Orbits: The paranasal sinuses and mastoid air cells are clear. The globes and orbits are within normal limits. CT NECK FINDINGS Pharynx and larynx: No focal mucosal or submucosal lesions are present. The nasopharynx, oropharynx, and hypopharynx are clear. The airway is  preserved. Salivary glands: Submandibular and parotid glands are normal bilaterally. Thyroid: The thyroid is atrophic.  No focal lesions are present. Lymph nodes: No significant cervical adenopathy is present. Vascular: No significant vascular calcifications are evident. Mastoids  and visualized paranasal sinuses: The visualized paranasal sinuses and mastoid air cells are clear. Skeleton: Endplate degenerative changes are present in the cervical spine from C3-4 through C6-7 there vertebral body heights are maintained. No focal lytic or blastic lesions are present. Upper chest: A ground-glass nodule in the right upper lobe measures 2.3 x 1.6 x 1.4 cm. The lung apices are otherwise clear. The upper mediastinum is unremarkable. IMPRESSION: 1. Stable white matter disease.  No acute intracranial abnormality. 2. 2.3 cm ground-glass nodule in the right upper lobe is worrisome for a primary bronchogenic neoplasm. Recommend CT chest with contrast and PET scan further evaluation. 3. Noncontrast CT the neck is otherwise unremarkable. No other acute or focal lesion to explain the patient's difficulty swallowing. These results will be called to the ordering clinician or representative by the Radiologist Assistant, and communication documented in the PACS or zVision Dashboard. Electronically Signed   By: San Morelle M.D.   On: 03/10/2017 11:09   Ct Soft Tissue Neck Wo Contrast  Result Date: 03/10/2017 CLINICAL DATA:  Mental status change. Difficulty swallowing and talking beginning 30 minutes ago. Throat swelling. EXAM: CT HEAD WITHOUT CONTRAST CT NECK WITHOUT CONTRAST TECHNIQUE: Contiguous axial images were obtained from the base of the skull through the vertex without contrast. Multidetector CT imaging of the neck was performed using the standard protocol without intravenous contrast. COMPARISON:  CT head without contrast 12/29/2013. FINDINGS: CT HEAD FINDINGS Brain: Mild white matter changes are noted bilaterally.  No acute infarct, hemorrhage, or mass lesion is present. The basal ganglia are within normal limits bilaterally. The insular ribbon is normal. Brainstem and cerebellum are unremarkable. Vascular: No hyperdense vessel or unexpected calcification. Skull: The calvarium is intact. No focal lytic or blastic lesions are present. Sinuses/Orbits: The paranasal sinuses and mastoid air cells are clear. The globes and orbits are within normal limits. CT NECK FINDINGS Pharynx and larynx: No focal mucosal or submucosal lesions are present. The nasopharynx, oropharynx, and hypopharynx are clear. The airway is preserved. Salivary glands: Submandibular and parotid glands are normal bilaterally. Thyroid: The thyroid is atrophic.  No focal lesions are present. Lymph nodes: No significant cervical adenopathy is present. Vascular: No significant vascular calcifications are evident. Mastoids and visualized paranasal sinuses: The visualized paranasal sinuses and mastoid air cells are clear. Skeleton: Endplate degenerative changes are present in the cervical spine from C3-4 through C6-7 there vertebral body heights are maintained. No focal lytic or blastic lesions are present. Upper chest: A ground-glass nodule in the right upper lobe measures 2.3 x 1.6 x 1.4 cm. The lung apices are otherwise clear. The upper mediastinum is unremarkable. IMPRESSION: 1. Stable white matter disease.  No acute intracranial abnormality. 2. 2.3 cm ground-glass nodule in the right upper lobe is worrisome for a primary bronchogenic neoplasm. Recommend CT chest with contrast and PET scan further evaluation. 3. Noncontrast CT the neck is otherwise unremarkable. No other acute or focal lesion to explain the patient's difficulty swallowing. These results will be called to the ordering clinician or representative by the Radiologist Assistant, and communication documented in the PACS or zVision Dashboard. Electronically Signed   By: San Morelle M.D.   On:  03/10/2017 11:09  .   PROCEDURE: Procedure: Diagnostic Fiberoptic Nasolaryngoscopy  Diagnosis: Dysphagia, hoarseness  Indications: Dysphagia. Hoarse  Findings:Nasopharynx, hypopharynx and tongue base are normal with no exudate except the previously noted white coating on the tongue dorsum. TVC are clear and mobile with minimal edema, and moderate pachyderma and erythema of  the posterior glottis typical of laryngopharyngeal reflux  Description of Procedure: After discussing procedure and risks  (primarily nose bleed) with the patient, the nose was anesthetized with topical Lidocaine 4% and decongested with phenylephrine. A flexible fiberoptic scope was passed through the left nasal cavity. The nasal cavity was inspected and the scope passed through the Nasopharynx to the region of the hypopharynx and larynx. The patient was instructed to phonate to assess vocal cord mobility. The tongue was extended to evaluate the tongue base completely. Valsalva was performed to insufflate the hypopharynx for improved examination. Findings are as noted above. The scope was withdrawn. The patient tolerated the procedure well.  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 May have mild thrush on the tongue, but no evidence of thrush in the hypopharynx. Swallowing impairment is out of proportion to physical findings.  PLAN: Would cover with PPI and make sure the GI cocktail prescribed is not contributing to her sense of dysphagia through its numbing effect on the throat. Nursing has not given it today though, so probably not a factor. If she does not improve consider GI consultation for esophageal evaluation since current findings do not explain her degree of impairment.  CT did show a right upper lobe lung nodule that will require further evaluation, which primary service can coordinate, but no reason to think this is related to swallowing issues.    This completes ENT evaluation. Please reconsult if further questions arise.   Rebecca Nearing, MD 03/10/2017 4:08 PM

## 2017-03-10 NOTE — Progress Notes (Signed)
Pt stating that she feels her throat is swelling more, and that her tongue feels like it is starting to swell. Her tongue and lips do NOT appear swollen on exam. VS remain stable. Dr. Margaretmary Eddy notified and ordered to administer 12.5mg  IV benadryl once. Orders placed.

## 2017-03-11 ENCOUNTER — Encounter: Payer: Self-pay | Admitting: Gastroenterology

## 2017-03-11 DIAGNOSIS — R1313 Dysphagia, pharyngeal phase: Secondary | ICD-10-CM

## 2017-03-11 LAB — CULTURE, BLOOD (ROUTINE X 2)
Culture: NO GROWTH
Culture: NO GROWTH
SPECIAL REQUESTS: ADEQUATE
Special Requests: ADEQUATE

## 2017-03-11 LAB — GLUCOSE, CAPILLARY
GLUCOSE-CAPILLARY: 89 mg/dL (ref 65–99)
Glucose-Capillary: 78 mg/dL (ref 65–99)

## 2017-03-11 MED ORDER — DEXTROSE-NACL 5-0.9 % IV SOLN
INTRAVENOUS | Status: DC
  Administered 2017-03-11 – 2017-03-12 (×2): via INTRAVENOUS

## 2017-03-11 MED ORDER — LEVOTHYROXINE SODIUM 100 MCG IV SOLR
62.5000 ug | Freq: Every day | INTRAVENOUS | Status: DC
  Administered 2017-03-11: 62.5 ug via INTRAVENOUS
  Filled 2017-03-11 (×3): qty 5

## 2017-03-11 NOTE — Progress Notes (Signed)
[facility] NAME: Rebecca Conner    MR#:  [id number]  DATE OF BIRTH:  30-Aug-1950  SUBJECTIVE:  CHIEF COMPLAINT:   Chief Complaint  Patient presents with  . Abdominal Pain  The patient is a 66 year old Caucasian female with past medical history significant for history of hypertension as well as hypothyroidism, who presented to the hospital with complaints of nausea, vomiting and diarrhea, restarted 1 day before admission. Apparently patient was seen in the emergency room about 2 weeks ago, she was given Cipro and Flagyl, which she took for 7 days, her symptoms resolved, but now reoccurred. Pain was described as constant without radiation, associated with nausea, vomiting and diarrhea. She also complained of fevers and chills. On arrival to emergency room, she was found to be septic with leukocytosis, tachycardia. CT of abdomen and pelvis was unremarkable. Labs revealed a C. difficile infection. Patient was admitted, now on vancomycin oral solution.  Pt's hoarseness improved But failed repeat bedside swallow evaluation Today as reported by RN.  Review of Systems  Constitutional: Negative for chills, fever and weight loss.  HENT: Positive for sore throat. Negative for congestion.   Eyes: Negative for blurred vision and double vision.  Respiratory: Negative for cough, sputum production, shortness of breath and wheezing.   Cardiovascular: Negative for chest pain, palpitations, orthopnea, leg swelling and PND.  Gastrointestinal: Positive for diarrhea. Negative for abdominal pain, blood in stool, constipation, nausea and vomiting.  Genitourinary: Negative for dysuria, frequency, hematuria and urgency.  Musculoskeletal: Negative for falls.  Neurological: Negative for dizziness, tingling, tremors, focal weakness and headaches.  Endo/Heme/Allergies: Does not bruise/bleed easily.  Psychiatric/Behavioral: Negative for depression. The  patient does not have insomnia.     VITAL SIGNS: Blood pressure 135/63, pulse 69, temperature 98.1 F (36.7 C), temperature source Oral, resp. rate 20, height 5\' 5"  (1.651 m), weight 81.6 kg (180 lb), SpO2 92 %.  PHYSICAL EXAMINATION:   GENERAL:  66 y.o.-year-old patient lying in the bed with no acute distress,prostrated look, dry oral mucosa, .  EYES: Pupils equal, round, reactive to light and accommodation. No scleral icterus. Extraocular muscles intact.  HEENT: Head atraumatic, normocephalic. Oropharynx -base of the tongue with white plaques. No edema of the tongue or lips noticed NECK:  Supple, no jugular venous distention. No thyroid enlargement, no tenderness.  LUNGS: Normal breath sounds bilaterally, no wheezing, rales,rhonchi or crepitation. No use of accessory muscles of respiration.  CARDIOVASCULAR: S1, S2 normal. No murmurs, rubs, or gallops.  ABDOMEN: Soft, nontender, nondistended. Bowel sounds present. No organomegaly or mass.  EXTREMITIES: No pedal edema, cyanosis, or clubbing.  NEUROLOGIC: Cranial nerves II through XII are intact. Muscle strength 5/5 in all extremities. Sensation intact. Gait not checked.  PSYCHIATRIC: The patient is alert and oriented x 3.  SKIN: No obvious rash, lesion, or ulcer.   ORDERS/RESULTS REVIEWED:   CBC  Recent Labs Lab 03/06/17 0748 03/07/17 0517 03/09/17 0355  WBC 15.8* 14.7* 7.3  HGB 13.3 11.4* 11.3*  HCT 39.3 34.6* 34.1*  PLT 300 255 243  MCV 81.1 82.2 83.0  MCH 27.5 27.1 27.6  MCHC 33.9 33.0 33.2  RDW 14.7* 14.8* 14.9*  LYMPHSABS 0.5*  --   --   MONOABS 0.8  --   --   EOSABS 0.0  --   --   BASOSABS 0.0  --   --    ------------------------------------------------------------------------------------------------------------------  Chemistries   Recent Labs Lab 03/06/17 0748 03/06/17 1216 03/07/17  6045 03/08/17 0410  NA 138  --  136 140  K 3.3*  --  3.2* 3.7  CL 104  --  106 112*  CO2 23  --  23 23  GLUCOSE 159*  --   130* 90  BUN 10  --  15 13  CREATININE 0.66  --  1.09* 0.90  CALCIUM 8.1*  --  6.7* 7.0*  MG  --  1.6*  --  2.7*  AST 32  --   --   --   ALT 37  --   --   --   ALKPHOS 92  --   --   --   BILITOT 1.4*  --   --   --    ------------------------------------------------------------------------------------------------------------------ estimated creatinine clearance is 63.1 mL/min (by C-G formula based on SCr of 0.9 mg/dL). ------------------------------------------------------------------------------------------------------------------ No results for input(s): TSH, T4TOTAL, T3FREE, THYROIDAB in the last 72 hours.  Invalid input(s): FREET3  Cardiac Enzymes No results for input(s): CKMB, TROPONINI, MYOGLOBIN in the last 168 hours.  Invalid input(s): CK ------------------------------------------------------------------------------------------------------------------ Invalid input(s): POCBNP ---------------------------------------------------------------------------------------------------------------  RADIOLOGY: Ct Head Wo Contrast  Result Date: 03/10/2017 CLINICAL DATA:  Mental status change. Difficulty swallowing and talking beginning 30 minutes ago. Throat swelling. EXAM: CT HEAD WITHOUT CONTRAST CT NECK WITHOUT CONTRAST TECHNIQUE: Contiguous axial images were obtained from the base of the skull through the vertex without contrast. Multidetector CT imaging of the neck was performed using the standard protocol without intravenous contrast. COMPARISON:  CT head without contrast 12/29/2013. FINDINGS: CT HEAD FINDINGS Brain: Mild white matter changes are noted bilaterally. No acute infarct, hemorrhage, or mass lesion is present. The basal ganglia are within normal limits bilaterally. The insular ribbon is normal. Brainstem and cerebellum are unremarkable. Vascular: No hyperdense vessel or unexpected calcification. Skull: The calvarium is intact. No focal lytic or blastic lesions are present.  Sinuses/Orbits: The paranasal sinuses and mastoid air cells are clear. The globes and orbits are within normal limits. CT NECK FINDINGS Pharynx and larynx: No focal mucosal or submucosal lesions are present. The nasopharynx, oropharynx, and hypopharynx are clear. The airway is preserved. Salivary glands: Submandibular and parotid glands are normal bilaterally. Thyroid: The thyroid is atrophic.  No focal lesions are present. Lymph nodes: No significant cervical adenopathy is present. Vascular: No significant vascular calcifications are evident. Mastoids and visualized paranasal sinuses: The visualized paranasal sinuses and mastoid air cells are clear. Skeleton: Endplate degenerative changes are present in the cervical spine from C3-4 through C6-7 there vertebral body heights are maintained. No focal lytic or blastic lesions are present. Upper chest: A ground-glass nodule in the right upper lobe measures 2.3 x 1.6 x 1.4 cm. The lung apices are otherwise clear. The upper mediastinum is unremarkable. IMPRESSION: 1. Stable white matter disease.  No acute intracranial abnormality. 2. 2.3 cm ground-glass nodule in the right upper lobe is worrisome for a primary bronchogenic neoplasm. Recommend CT chest with contrast and PET scan further evaluation. 3. Noncontrast CT the neck is otherwise unremarkable. No other acute or focal lesion to explain the patient's difficulty swallowing. These results will be called to the ordering clinician or representative by the Radiologist Assistant, and communication documented in the PACS or zVision Dashboard. Electronically Signed   By: San Morelle M.D.   On: 03/10/2017 11:09   Ct Soft Tissue Neck Wo Contrast  Result Date: 03/10/2017 CLINICAL DATA:  Mental status change. Difficulty swallowing and talking beginning 30 minutes ago. Throat swelling. EXAM: CT HEAD WITHOUT CONTRAST CT NECK  WITHOUT CONTRAST TECHNIQUE: Contiguous axial images were obtained from the base of the skull  through the vertex without contrast. Multidetector CT imaging of the neck was performed using the standard protocol without intravenous contrast. COMPARISON:  CT head without contrast 12/29/2013. FINDINGS: CT HEAD FINDINGS Brain: Mild white matter changes are noted bilaterally. No acute infarct, hemorrhage, or mass lesion is present. The basal ganglia are within normal limits bilaterally. The insular ribbon is normal. Brainstem and cerebellum are unremarkable. Vascular: No hyperdense vessel or unexpected calcification. Skull: The calvarium is intact. No focal lytic or blastic lesions are present. Sinuses/Orbits: The paranasal sinuses and mastoid air cells are clear. The globes and orbits are within normal limits. CT NECK FINDINGS Pharynx and larynx: No focal mucosal or submucosal lesions are present. The nasopharynx, oropharynx, and hypopharynx are clear. The airway is preserved. Salivary glands: Submandibular and parotid glands are normal bilaterally. Thyroid: The thyroid is atrophic.  No focal lesions are present. Lymph nodes: No significant cervical adenopathy is present. Vascular: No significant vascular calcifications are evident. Mastoids and visualized paranasal sinuses: The visualized paranasal sinuses and mastoid air cells are clear. Skeleton: Endplate degenerative changes are present in the cervical spine from C3-4 through C6-7 there vertebral body heights are maintained. No focal lytic or blastic lesions are present. Upper chest: A ground-glass nodule in the right upper lobe measures 2.3 x 1.6 x 1.4 cm. The lung apices are otherwise clear. The upper mediastinum is unremarkable. IMPRESSION: 1. Stable white matter disease.  No acute intracranial abnormality. 2. 2.3 cm ground-glass nodule in the right upper lobe is worrisome for a primary bronchogenic neoplasm. Recommend CT chest with contrast and PET scan further evaluation. 3. Noncontrast CT the neck is otherwise unremarkable. No other acute or focal  lesion to explain the patient's difficulty swallowing. These results will be called to the ordering clinician or representative by the Radiologist Assistant, and communication documented in the PACS or zVision Dashboard. Electronically Signed   By: San Morelle M.D.   On: 03/10/2017 11:09    EKG:  Orders placed or performed during the hospital encounter of 02/28/17  . ED EKG  . ED EKG  . EKG    ASSESSMENT AND PLAN:  Active Problems:   Sepsis ([place])  1. Sepsis due to C. difficile Colitis  Held  by mouth vancomycin as patient is currently nothing by mouth, currently she is on IV Flagyl, resume by mouth vancomycin once patient tolerates food by mouth  blood cultures are negative so far  urine culture revealed multiple species, recollection was suggested,Patient is asymptomatic Start patient on soft diet and probiotics Physical therapy Monitor number of bowel movements    # dysphagia  Consult gastroenterology for dysphagia patient is nothing by mouth. She might be benefited with EGD CT head and neck are negative neurology consulted, discussed with Dr. Irish Elders on call neurologist , doesn't think it is stroke, no other stroke workup recommended Speech therapy consult Recommending nothing by mouth for now Seen by ENT, recommending PPI and GI consult as there is no clinical improvement   discontinue GI cocktail  #Right upper lobe nodule Patient needs outpatient CT chest and PET scan for further evaluation after discharge  #Thrush-oral Nystatin swish and swallow We'll get throat culture and sensitivity  #. Hypokalemia, supplemented intravenously, magnesium level was also found to be low, supplemented intravenously, normalized  #. Hyperglycemia, hemoglobin A1c 6.1, prediabetes  # hypothyroidism patient is nothing by mouth. Give levothyroxine IV 62.5 g. Resume by mouth Synthroid  125 g once patient starts tolerating diet    physical therapy -snf , follow up with case  manager and social worker for placement   Management plans discussed with the patient, patient's sister [phone] and they are in agreement.   DRUG ALLERGIES:  Allergies  Allergen Reactions  . Lisinopril Shortness Of Breath and Palpitations  . Tetanus Toxoids Anaphylaxis  . Ciprofloxacin     Pt had muscle stiffness and pain in her knee while taking. Pt was having trouble walking.  . Fluticasone   . Imodium [Loperamide] Itching  . Lovastatin     intolerance  . Metronidazole     Pt had muscle stiffness and pain in her knee while taking. Pt was having trouble walking.   . Paroxetine Nausea Only  . Rosuvastatin Calcium Itching  . Motrin [Ibuprofen] Rash    Pt states, "It counteracts my Synthroid."    CODE STATUS:     Code Status Orders        Start     Ordered   03/06/17 1208  Full code  Continuous     03/06/17 1207    Code Status History    Date Active Date Inactive Code Status Order ID Comments User Context   This patient has a current code status but no historical code status.    Advance Directive Documentation     Most Recent Value  Type of Advance Directive  Living will  Pre-existing out of facility DNR order (yellow form or pink MOST form)  -  "MOST" Form in Place?  -      TOTAL TIME TAKING CARE OF THIS PATIENT: 35 minutes.    Nicholes Mango M.D on 03/11/2017 at 12:41 PM  Between 7am to 6pm - Pager - [phone]  After 6pm go to [email or web address] - password [email or web address] [facility]  Office  [phone]  CC: Primary care physician; System, Pcp Not In

## 2017-03-11 NOTE — Consult Note (Addendum)
Reason for Consult: Dysphagia Referring Physician: Dr. Lucina Mellow Jadaya Sommerfield is an 66 y.o. female.  HPI: Seen in consultation at the request of Dr. Margaretmary Eddy for further evaluation of dysphagia. The history is obtained from the patient, my conversation with her bedside nurse, review of EPIC, and her pastor at the bedside.  Known hypertension and hypothyroidism who was admitted with C Diff colitis. Initially diagnosed 2 weeks ago. Treated with 7 days of Cipro and Flagyl. Symptoms recurred and she was readmitted 03/06/17. Abdominal pain is diffuse, constant, with some nausea. CT of the abd/pelvis on this admission was unremarkable. Labs confirmed C Diff and she was started on oral vancomycin. Her GI symptoms were improving.  Yesterday she developed acute hoarseness and dysphagia. There was concern for an allergic reaction and she was given benadryl with some immediate relief.  A bedside swallow showed adequate oral phase swallowing but the pathologist was unable to asses the pharyngeal phase. Thrush noted and she was started on fluconazole. A Neck CT was nondiagnostic. She was seen by ENT who performed a bedside diagnostic fiberoptic nasolaryngoscopy. He diagnosed her with laryngopharngeal reflux aggravated by recent vomiting. He felt the swallowing impairment is out of proportion to the physical findings.  She was made NPO given the concerns for aspiration. Repeat speech swallow study is planned for tomorrow. CT showed a right upper lobe lung nodule that needs to be further investigated.    Past Medical History:  Diagnosis Date  . Disease of thyroid gland   . Hypertension     Past Surgical History:  Procedure Laterality Date  . ABDOMINAL HYSTERECTOMY    . APPENDECTOMY    . BREAST CYST EXCISION    . CHOLECYSTECTOMY    . THYROID SURGERY      Family History  Problem Relation Age of Onset  . Cancer Father   . Hypertension Father   . Stroke Father   . Heart attack Father   . Stroke Mother    . Breast cancer Sister   . Heart attack Brother   . Hypertension Brother     Social History:  reports that she has never smoked. She has never used smokeless tobacco. She reports that she does not drink alcohol or use drugs.  Allergies:  Allergies  Allergen Reactions  . Lisinopril Shortness Of Breath and Palpitations  . Tetanus Toxoids Anaphylaxis  . Ciprofloxacin     Pt had muscle stiffness and pain in her knee while taking. Pt was having trouble walking.  . Fluticasone   . Imodium [Loperamide] Itching  . Lovastatin     intolerance  . Metronidazole     Pt had muscle stiffness and pain in her knee while taking. Pt was having trouble walking.   . Paroxetine Nausea Only  . Rosuvastatin Calcium Itching  . Motrin [Ibuprofen] Rash    Pt states, "It counteracts my Synthroid."    Medications:  I have reviewed the patient's current medications. Prior to Admission:  Prescriptions Prior to Admission  Medication Sig Dispense Refill Last Dose  . aspirin EC 81 MG tablet Take 81 mg by mouth daily.   03/05/2017 at 0600  . cyanocobalamin 1000 MCG tablet Take 1,000 mcg by mouth daily.   03/05/2017 at 0600  . levothyroxine (SYNTHROID, LEVOTHROID) 125 MCG tablet Take 1 tablet by mouth daily.   03/05/2017 at 0600  . pravastatin (PRAVACHOL) 40 MG tablet Take 40 mg by mouth daily.   03/05/2017 at 0600  . diazepam (VALIUM) 5 MG tablet  Take 1 tablet (5 mg total) by mouth every 8 (eight) hours as needed for muscle spasms. (Patient not taking: Reported on 03/06/2017) 20 tablet 0 Not Taking at Unknown time  . oxyCODONE-acetaminophen (ROXICET) 5-325 MG tablet Take 1 tablet by mouth every 4 (four) hours as needed for severe pain. (Patient not taking: Reported on 03/06/2017) 20 tablet 0 Not Taking at Unknown time   Scheduled: . acidophilus  2 capsule Oral TID  . aspirin EC  81 mg Oral Daily  . enoxaparin (LOVENOX) injection  40 mg Subcutaneous Q24H  . feeding supplement  1 Container Oral TID BM  . levothyroxine   62.5 mcg Intravenous Daily  . nystatin  5 mL Mouth/Throat QID  . pantoprazole (PROTONIX) IV  40 mg Intravenous Q24H  . sucralfate  1 g Oral TID WC & HS   Continuous: . 0.9 % NaCl with KCl 20 mEq / L 1,000 mL (03/11/17 1851)  . metronidazole Stopped (03/11/17 1639)   FXT:[id number] **OR** acetaminophen, albuterol, diphenhydrAMINE, EPINEPHrine, gi cocktail, HYDROcodone-acetaminophen, iopamidol, ondansetron **OR** ondansetron (ZOFRAN) IV, prochlorperazine, traZODone  Results for orders placed or performed during the hospital encounter of 03/06/17 (from the past 48 hour(s))  Culture, group A strep     Status: None (Preliminary result)   Collection Time: 03/10/17  2:17 PM  Result Value Ref Range   Specimen Description THROAT    Special Requests NONE    Culture      CULTURE REINCUBATED FOR BETTER GROWTH Performed at [facility], [place] [address], [place], [place] [address]    Report Status PENDING   Glucose, capillary     Status: None   Collection Time: 03/11/17 12:48 AM  Result Value Ref Range   Glucose-Capillary 89 65 - 99 mg/dL    Ct Head Wo Contrast  Result Date: 03/10/2017 CLINICAL DATA:  Mental status change. Difficulty swallowing and talking beginning 30 minutes ago. Throat swelling. EXAM: CT HEAD WITHOUT CONTRAST CT NECK WITHOUT CONTRAST TECHNIQUE: Contiguous axial images were obtained from the base of the skull through the vertex without contrast. Multidetector CT imaging of the neck was performed using the standard protocol without intravenous contrast. COMPARISON:  CT head without contrast 12/29/2013. FINDINGS: CT HEAD FINDINGS Brain: Mild white matter changes are noted bilaterally. No acute infarct, hemorrhage, or mass lesion is present. The basal ganglia are within normal limits bilaterally. The insular ribbon is normal. Brainstem and cerebellum are unremarkable. Vascular: No hyperdense vessel or unexpected calcification. Skull: The calvarium is intact. No focal lytic or  blastic lesions are present. Sinuses/Orbits: The paranasal sinuses and mastoid air cells are clear. The globes and orbits are within normal limits. CT NECK FINDINGS Pharynx and larynx: No focal mucosal or submucosal lesions are present. The nasopharynx, oropharynx, and hypopharynx are clear. The airway is preserved. Salivary glands: Submandibular and parotid glands are normal bilaterally. Thyroid: The thyroid is atrophic.  No focal lesions are present. Lymph nodes: No significant cervical adenopathy is present. Vascular: No significant vascular calcifications are evident. Mastoids and visualized paranasal sinuses: The visualized paranasal sinuses and mastoid air cells are clear. Skeleton: Endplate degenerative changes are present in the cervical spine from C3-4 through C6-7 there vertebral body heights are maintained. No focal lytic or blastic lesions are present. Upper chest: A ground-glass nodule in the right upper lobe measures 2.3 x 1.6 x 1.4 cm. The lung apices are otherwise clear. The upper mediastinum is unremarkable. IMPRESSION: 1. Stable white matter disease.  No acute intracranial abnormality. 2. 2.3  cm ground-glass nodule in the right upper lobe is worrisome for a primary bronchogenic neoplasm. Recommend CT chest with contrast and PET scan further evaluation. 3. Noncontrast CT the neck is otherwise unremarkable. No other acute or focal lesion to explain the patient's difficulty swallowing. These results will be called to the ordering clinician or representative by the Radiologist Assistant, and communication documented in the PACS or zVision Dashboard. Electronically Signed   By: San Morelle M.D.   On: 03/10/2017 11:09   Ct Soft Tissue Neck Wo Contrast  Result Date: 03/10/2017 CLINICAL DATA:  Mental status change. Difficulty swallowing and talking beginning 30 minutes ago. Throat swelling. EXAM: CT HEAD WITHOUT CONTRAST CT NECK WITHOUT CONTRAST TECHNIQUE: Contiguous axial images were obtained  from the base of the skull through the vertex without contrast. Multidetector CT imaging of the neck was performed using the standard protocol without intravenous contrast. COMPARISON:  CT head without contrast 12/29/2013. FINDINGS: CT HEAD FINDINGS Brain: Mild white matter changes are noted bilaterally. No acute infarct, hemorrhage, or mass lesion is present. The basal ganglia are within normal limits bilaterally. The insular ribbon is normal. Brainstem and cerebellum are unremarkable. Vascular: No hyperdense vessel or unexpected calcification. Skull: The calvarium is intact. No focal lytic or blastic lesions are present. Sinuses/Orbits: The paranasal sinuses and mastoid air cells are clear. The globes and orbits are within normal limits. CT NECK FINDINGS Pharynx and larynx: No focal mucosal or submucosal lesions are present. The nasopharynx, oropharynx, and hypopharynx are clear. The airway is preserved. Salivary glands: Submandibular and parotid glands are normal bilaterally. Thyroid: The thyroid is atrophic.  No focal lesions are present. Lymph nodes: No significant cervical adenopathy is present. Vascular: No significant vascular calcifications are evident. Mastoids and visualized paranasal sinuses: The visualized paranasal sinuses and mastoid air cells are clear. Skeleton: Endplate degenerative changes are present in the cervical spine from C3-4 through C6-7 there vertebral body heights are maintained. No focal lytic or blastic lesions are present. Upper chest: A ground-glass nodule in the right upper lobe measures 2.3 x 1.6 x 1.4 cm. The lung apices are otherwise clear. The upper mediastinum is unremarkable. IMPRESSION: 1. Stable white matter disease.  No acute intracranial abnormality. 2. 2.3 cm ground-glass nodule in the right upper lobe is worrisome for a primary bronchogenic neoplasm. Recommend CT chest with contrast and PET scan further evaluation. 3. Noncontrast CT the neck is otherwise unremarkable. No  other acute or focal lesion to explain the patient's difficulty swallowing. These results will be called to the ordering clinician or representative by the Radiologist Assistant, and communication documented in the PACS or zVision Dashboard. Electronically Signed   By: San Morelle M.D.   On: 03/10/2017 11:09    Review of Systems  Constitutional: Negative for chills and fever.  HENT: Negative for hearing loss and tinnitus.   Eyes: Negative for blurred vision and double vision.  Respiratory: Negative for cough and hemoptysis.   Cardiovascular: Negative for chest pain and palpitations.  Gastrointestinal: Positive for diarrhea and nausea.  Genitourinary: Negative for dysuria and urgency.  Musculoskeletal: Negative for myalgias and neck pain.  Skin: Negative for itching and rash.  Neurological: Negative for dizziness and headaches.  Endo/Heme/Allergies: Negative for polydipsia. Does not bruise/bleed easily.  Psychiatric/Behavioral: Negative for depression and suicidal ideas.   Blood pressure 135/63, pulse 69, temperature 98.1 F (36.7 C), temperature source Oral, resp. rate 20, height 5\' 5"  (1.651 m), weight 180 lb (81.6 kg), SpO2 92 %. Physical Exam  Constitutional:  She is oriented to person, place, and time. She appears well-developed and well-nourished. No distress.  HENT:  Head: Normocephalic and atraumatic.  Tongue is white - ? thrush  Eyes: Conjunctivae are normal. No scleral icterus.  Neck: Neck supple. No thyromegaly present.  Cardiovascular: Normal rate and regular rhythm.   Respiratory: Effort normal and breath sounds normal.  GI: Soft. Bowel sounds are normal. She exhibits no distension and no mass. There is no tenderness. There is no rebound and no guarding.  Central obesity  Musculoskeletal: Normal range of motion. She exhibits no edema.  Neurological: She is alert and oriented to person, place, and time.  Skin: Skin is dry. No rash noted.  Psychiatric: She has a  normal mood and affect. Thought content normal.    Assessment/Plan: Acute onset hoarseness and dysphagia    - ENT did not feel findings explain her degree of impairment Laryngopharyngeal reflux Possible oral thrush C Diff colitis    - treated with 7 days of Cipro/Flagyl    - currently on oral vancomycin    - clinically improving during this hospitalization Right upper lobe lung nodule  Acute onset esophageal dysphagia is rare. And, it sounds like this patient has an acute onset pharyngeal dysphagia. There is possible concurrent thrush and reflux. She has already been seen by ENT. Upper endoscopy would be low yield at this time.Agree with ENT recommendations for PPI therapy and antifungal therapy. She may benefit from fluconazole over nystatin if there is any additional concern for esophageal dysphagia. There are reports of small cell lung cancer associated with paraneoplastic syndrome. It is rare, but, may explain some acute symptoms out of proportion to her physical findings.   Thank you for allowing me to participate in [facility] care. Please call with any questions or concerns  [place] 03/11/2017, 7:22 PM

## 2017-03-11 NOTE — Progress Notes (Signed)
Patient discussed wanting to change code status to DNR. Patient told informed Probation officer of this and wanted Probation officer to change her status but was told that I will make a note of this change and the doctor will change the status after speaking to her on the next doctors rounds. Patient is alert and oriented ambulates well without assistance. No acute distress noted.

## 2017-03-12 LAB — CBC WITH DIFFERENTIAL/PLATELET
BASOS ABS: 0 10*3/uL (ref 0–0.1)
BASOS PCT: 0 %
EOS PCT: 3 %
Eosinophils Absolute: 0.2 10*3/uL (ref 0–0.7)
HCT: 36.4 % (ref 35.0–47.0)
Hemoglobin: 12.1 g/dL (ref 12.0–16.0)
Lymphocytes Relative: 27 %
Lymphs Abs: 2.1 10*3/uL (ref 1.0–3.6)
MCH: 27.2 pg (ref 26.0–34.0)
MCHC: 33.3 g/dL (ref 32.0–36.0)
MCV: 81.6 fL (ref 80.0–100.0)
MONO ABS: 0.9 10*3/uL (ref 0.2–0.9)
MONOS PCT: 12 %
Neutro Abs: 4.7 10*3/uL (ref 1.4–6.5)
Neutrophils Relative %: 58 %
PLATELETS: 353 10*3/uL (ref 150–440)
RBC: 4.46 MIL/uL (ref 3.80–5.20)
RDW: 14.4 % (ref 11.5–14.5)
WBC: 7.9 10*3/uL (ref 3.6–11.0)

## 2017-03-12 LAB — BASIC METABOLIC PANEL
Anion gap: 9 (ref 5–15)
BUN: 8 mg/dL (ref 6–20)
CO2: 24 mmol/L (ref 22–32)
CREATININE: 0.75 mg/dL (ref 0.44–1.00)
Calcium: 8 mg/dL — ABNORMAL LOW (ref 8.9–10.3)
Chloride: 105 mmol/L (ref 101–111)
GFR calc Af Amer: >60 mL/min (ref >60)
GLUCOSE: 106 mg/dL — AB (ref 65–99)
Potassium: 3.3 mmol/L — ABNORMAL LOW (ref 3.5–5.1)
SODIUM: 138 mmol/L (ref 135–145)

## 2017-03-12 LAB — GLUCOSE, CAPILLARY: GLUCOSE-CAPILLARY: 102 mg/dL — AB (ref 65–99)

## 2017-03-12 MED ORDER — LEVOTHYROXINE SODIUM 100 MCG PO TABS
125.0000 ug | ORAL_TABLET | Freq: Every day | ORAL | Status: DC
  Administered 2017-03-12: 125 ug via ORAL

## 2017-03-12 MED ORDER — VANCOMYCIN HCL 125 MG PO CAPS: 125.0000 mg | ORAL_CAPSULE | Freq: Four times a day (QID) | ORAL | 0 refills | Status: DC

## 2017-03-12 MED ORDER — OMEPRAZOLE 40 MG PO CPDR: 40.0000 mg | DELAYED_RELEASE_CAPSULE | Freq: Every day | ORAL | 0 refills | Status: DC

## 2017-03-12 MED ORDER — SUCRALFATE 1 GM/10ML PO SUSP: 1.0000 g | Freq: Three times a day (TID) | ORAL | 0 refills | Status: DC

## 2017-03-12 MED ORDER — BOOST / RESOURCE BREEZE PO LIQD: 1.0000 | Freq: Three times a day (TID) | ORAL | 0 refills | Status: DC

## 2017-03-12 MED ORDER — RISAQUAD PO CAPS: 2.0000 | ORAL_CAPSULE | Freq: Three times a day (TID) | ORAL | 0 refills | Status: DC

## 2017-03-12 MED ORDER — NYSTATIN 100000 UNIT/ML MT SUSP: 5.0000 mL | Freq: Four times a day (QID) | OROMUCOSAL | 0 refills | Status: DC

## 2017-03-12 MED ORDER — POTASSIUM CHLORIDE 20 MEQ PO PACK
40.0000 meq | PACK | Freq: Once | ORAL | Status: AC
  Administered 2017-03-12: 40 meq via ORAL
  Filled 2017-03-12: qty 2

## 2017-03-12 NOTE — Progress Notes (Signed)
Patient discharged to home, follow up appointment, given as ordered. Prescriptions given as ordered. Patient is alert and oriented ambulates well without assistance. Patient IV and telemetry discontinued. Patient take home by a family friend.

## 2017-03-12 NOTE — Progress Notes (Signed)
OT Cancellation Note  Patient Details Name: Rebecca Conner MRN: [id number] DOB: [birth year]   Cancelled Treatment:    Reason Eval/Treat Not Completed: Other (comment). Order received, chart reviewed. Spoke with RN and CSW, pt refusing STR, discharging home with home health. No OT evaluation needed at this time. Will sign off. Please re-consult if additional OT needs arise.   Jeni Salles, MPH, MS, OTR/L ascom [phone] 03/12/17, 3:02 PM

## 2017-03-12 NOTE — Discharge Summary (Signed)
[facility] NAME: Rebecca Conner    MR#:  [id number]  DATE OF BIRTH:  28-Jun-1951  DATE OF ADMISSION:  03/06/2017 ADMITTING PHYSICIAN: Demetrios Loll, MD  DATE OF DISCHARGE: 03/12/17  PRIMARY CARE PHYSICIAN: System, Pcp Not In    ADMISSION DIAGNOSIS:  Gastroenteritis [K52.9]  DISCHARGE DIAGNOSIS:  Active Problems:   Sepsis ([place])   SECONDARY DIAGNOSIS:   Past Medical History:  Diagnosis Date  . Disease of thyroid gland   . Hypertension     HOSPITAL COURSE:   HPI  Rebecca Conner  is a 66 y.o. female with a known history of Hypertension and hypothyroidism. The patient had similar symptoms and he came to the ED 2 weeks ago. She was given Cipro and Flagyl, which she took 7 days. But she developed leg cramp pain pain which is the side effect of antibiotics. Her symptoms resoled. But she started to have lower abdominal cramping pain since yesterday, which is constant 8 out of 10 without radiation, associated with nausea, vomiting and diarrhea. She also complains of fever and chills. She denies any eating outside or leftover food. No use of contact no travel history. She was found septic with leukocytosis and tachycardia in the ED. CAT scan of abdomen is unremarkable.  1. Sepsis due to C. difficile Colitis Clinically doing better, agreeable to go home today refusing skilled care facility  resume and continue po vancomycin for total of 10 days. Diarrhea resolved  blood cultures are negative   urine culture revealed multiple species, recollection was suggested,Patient is asymptomatic  patient started on dysphagia 3 diet with thin liquids as recommended by speech therapy and probiotics Physical therapy recommended skilled nursing facility but patient refusing to go to skilled care and wants to go home with home health     # dysphagia  Clinically improving  nystatin swish and swallow and PPI GI is not recommending EGD CT head and neck  are negative neurology consulted, discussed with Dr. Irish Elders on call neurologist , doesn't think it is stroke, no other stroke workup recommended Speech therapy consult Recommending dysphagia 3 diet with thin liquids Seen by ENT, recommending PPI and GI consult   discontinue GI cocktail  #Right upper lobe nodule Patient needs outpatient CT chest and PET scan for further evaluation after discharge  #Thrush-oral Nystatin swish and swallow Throat culture being awaited for better growth PCP to follow up on the throat culture results  #. Hypokalemia, supplemented intravenously, magnesium level was also found to be low, supplemented intravenously, normalized  #. Hyperglycemia, hemoglobin A1c 6.1, prediabetes- diabetic diet  # hypothyroidism - Resume by mouth Synthroid 125 g   Discharge patient to home with home health patient refused to go to snf  DISCHARGE CONDITIONS:   FAIR  CONSULTS OBTAINED:  Treatment Team:  Leotis Pain, MD Clyde Canterbury, MD [place], MD   PROCEDURES  None   DRUG ALLERGIES:   Allergies  Allergen Reactions  . Lisinopril Shortness Of Breath and Palpitations  . Tetanus Toxoids Anaphylaxis  . Ciprofloxacin     Pt had muscle stiffness and pain in her knee while taking. Pt was having trouble walking.  . Fluticasone   . Imodium [Loperamide] Itching  . Lovastatin     intolerance  . Metronidazole     Pt had muscle stiffness and pain in her knee while taking. Pt was having trouble walking.   . Paroxetine Nausea Only  . Rosuvastatin Calcium Itching  .  Motrin [Ibuprofen] Rash    Pt states, "It counteracts my Synthroid."    DISCHARGE MEDICATIONS:   Current Discharge Medication List    START taking these medications   Details  acidophilus (RISAQUAD) CAPS capsule Take 2 capsules by mouth 3 (three) times daily. Qty: 50 capsule, Refills: 0    feeding supplement (BOOST / RESOURCE BREEZE) LIQD Take 1 Container by mouth 3 (three)  times daily between meals. Qty: 90 Container, Refills: 0    nystatin (MYCOSTATIN) 100000 UNIT/ML suspension Use as directed 5 mLs (500,000 Units total) in the mouth or throat 4 (four) times daily. Qty: 100 mL, Refills: 0    omeprazole (PRILOSEC) 40 MG capsule Take 1 capsule (40 mg total) by mouth daily before breakfast. Qty: 30 capsule, Refills: 0    sucralfate (CARAFATE) 1 GM/10ML suspension Take 10 mLs (1 g total) by mouth 4 (four) times daily -  with meals and at bedtime. Qty: 420 mL, Refills: 0    vancomycin (VANCOCIN HCL) 125 MG capsule Take 1 capsule (125 mg total) by mouth 4 (four) times daily. Qty: 20 capsule, Refills: 0      CONTINUE these medications which have NOT CHANGED   Details  aspirin EC 81 MG tablet Take 81 mg by mouth daily.    cyanocobalamin 1000 MCG tablet Take 1,000 mcg by mouth daily.    levothyroxine (SYNTHROID, LEVOTHROID) 125 MCG tablet Take 1 tablet by mouth daily.    pravastatin (PRAVACHOL) 40 MG tablet Take 40 mg by mouth daily.    oxyCODONE-acetaminophen (ROXICET) 5-325 MG tablet Take 1 tablet by mouth every 4 (four) hours as needed for severe pain. Qty: 20 tablet, Refills: 0      STOP taking these medications     diazepam (VALIUM) 5 MG tablet          DISCHARGE INSTRUCTIONS:   Follow-up with primary care physician in a week Follow-up with gastroenterology in 2 weeks  DIET:  Dysphagia 3 diet with thin liquids  DISCHARGE CONDITION:  Stable  ACTIVITY:  Activity as tolerated  OXYGEN:  Home Oxygen: No.   Oxygen Delivery: room air  DISCHARGE LOCATION:  home   If you experience worsening of your admission symptoms, develop shortness of breath, life threatening emergency, suicidal or homicidal thoughts you must seek medical attention immediately by calling 911 or calling your MD immediately  if symptoms less severe.  You Must read complete instructions/literature along with all the possible adverse reactions/side effects for all the  Medicines you take and that have been prescribed to you. Take any new Medicines after you have completely understood and accpet all the possible adverse reactions/side effects.   Please note  You were cared for by a hospitalist during your hospital stay. If you have any questions about your discharge medications or the care you received while you were in the hospital after you are discharged, you can call the unit and asked to speak with the hospitalist on call if the hospitalist that took care of you is not available. Once you are discharged, your primary care physician will handle any further medical issues. Please note that NO REFILLS for any discharge medications will be authorized once you are discharged, as it is imperative that you return to your primary care physician (or establish a relationship with a primary care physician if you do not have one) for your aftercare needs so that they can reassess your need for medications and monitor your lab values.     Today  Chief Complaint  Patient presents with  . Abdominal Pain   Patient's diarrhea resolved. Able to swallow dysphagia 3 diet. Hoarseness of the voice is resolved. Denies any abdominal pain no nausea and wants to go home refusing skilled nursing facility  ROS:  CONSTITUTIONAL: Denies fevers, chills. Denies any fatigue, weakness.  EYES: Denies blurry vision, double vision, eye pain. EARS, NOSE, THROAT: Denies tinnitus, ear pain, hearing loss. RESPIRATORY: Denies cough, wheeze, shortness of breath.  CARDIOVASCULAR: Denies chest pain, palpitations, edema.  GASTROINTESTINAL: Denies nausea, vomiting, diarrhea, abdominal pain. Denies bright red blood per rectum. GENITOURINARY: Denies dysuria, hematuria. ENDOCRINE: Denies nocturia or thyroid problems. HEMATOLOGIC AND LYMPHATIC: Denies easy bruising or bleeding. SKIN: Denies rash or lesion. MUSCULOSKELETAL: Denies pain in neck, back, shoulder, knees, hips or arthritic symptoms.   NEUROLOGIC: Denies paralysis, paresthesias.  PSYCHIATRIC: Denies anxiety or depressive symptoms.   VITAL SIGNS:  Blood pressure (!) 151/69, pulse 69, temperature 98 F (36.7 C), temperature source Oral, resp. rate 16, height 5\' 5"  (1.651 m), weight 81.6 kg (180 lb), SpO2 96 %.  I/O:    Intake/Output Summary (Last 24 hours) at 03/12/17 1340 Last data filed at 03/12/17 0901  Gross per 24 hour  Intake             1432 ml  Output              450 ml  Net              982 ml    PHYSICAL EXAMINATION:  GENERAL:  66 y.o.-year-old patient lying in the bed with no acute distress.  EYES: Pupils equal, round, reactive to light and accommodation. No scleral icterus. Extraocular muscles intact.  HEENT: Head atraumatic, normocephalic. Oropharynx and nasopharynx clear.  NECK:  Supple, no jugular venous distention. No thyroid enlargement, no tenderness.  LUNGS: Normal breath sounds bilaterally, no wheezing, rales,rhonchi or crepitation. No use of accessory muscles of respiration.  CARDIOVASCULAR: S1, S2 normal. No murmurs, rubs, or gallops.  ABDOMEN: Soft, non-tender, non-distended. Bowel sounds present. No organomegaly or mass.  EXTREMITIES: No pedal edema, cyanosis, or clubbing.  NEUROLOGIC: Cranial nerves II through XII are intact. Muscle strength 5/5 in all extremities. Sensation intact. Gait not checked.  PSYCHIATRIC: The patient is alert and oriented x 3.  SKIN: No obvious rash, lesion, or ulcer.   DATA REVIEW:   CBC  Recent Labs Lab 03/12/17 0456  WBC 7.9  HGB 12.1  HCT 36.4  PLT 353    Chemistries   Recent Labs Lab 03/06/17 0748  03/08/17 0410 03/12/17 0456  NA 138  < > 140 138  K 3.3*  < > 3.7 3.3*  CL 104  < > 112* 105  CO2 23  < > 23 24  GLUCOSE 159*  < > 90 106*  BUN 10  < > 13 8  CREATININE 0.66  < > 0.90 0.75  CALCIUM 8.1*  < > 7.0* 8.0*  MG  --   < > 2.7*  --   AST 32  --   --   --   ALT 37  --   --   --   ALKPHOS 92  --   --   --   BILITOT 1.4*  --    --   --   < > = values in this interval not displayed.  Cardiac Enzymes No results for input(s): TROPONINI in the last 168 hours.  Microbiology Results  Results for orders placed or performed during the  hospital encounter of 03/06/17  Blood Culture (routine x 2)     Status: None   Collection Time: 03/06/17  7:48 AM  Result Value Ref Range Status   Specimen Description BLOOD LAC  Final   Special Requests   Final    BOTTLES DRAWN AEROBIC AND ANAEROBIC Blood Culture adequate volume   Culture NO GROWTH 5 DAYS  Final   Report Status 03/11/2017 FINAL  Final  Blood Culture (routine x 2)     Status: None   Collection Time: 03/06/17  7:49 AM  Result Value Ref Range Status   Specimen Description BLOOD LFOA  Final   Special Requests   Final    BOTTLES DRAWN AEROBIC AND ANAEROBIC Blood Culture adequate volume   Culture NO GROWTH 5 DAYS  Final   Report Status 03/11/2017 FINAL  Final  Gastrointestinal Panel by PCR , Stool     Status: None   Collection Time: 03/06/17  7:49 AM  Result Value Ref Range Status   Campylobacter species NOT DETECTED NOT DETECTED Final   Plesimonas shigelloides NOT DETECTED NOT DETECTED Final   Salmonella species NOT DETECTED NOT DETECTED Final   Yersinia enterocolitica NOT DETECTED NOT DETECTED Final   Vibrio species NOT DETECTED NOT DETECTED Final   Vibrio cholerae NOT DETECTED NOT DETECTED Final   Enteroaggregative E coli (EAEC) NOT DETECTED NOT DETECTED Final   Enteropathogenic E coli (EPEC) NOT DETECTED NOT DETECTED Final   Enterotoxigenic E coli (ETEC) NOT DETECTED NOT DETECTED Final   Shiga like toxin producing E coli (STEC) NOT DETECTED NOT DETECTED Final   Shigella/Enteroinvasive E coli (EIEC) NOT DETECTED NOT DETECTED Final   Cryptosporidium NOT DETECTED NOT DETECTED Final   Cyclospora cayetanensis NOT DETECTED NOT DETECTED Final   Entamoeba histolytica NOT DETECTED NOT DETECTED Final   Giardia lamblia NOT DETECTED NOT DETECTED Final   Adenovirus F40/41  NOT DETECTED NOT DETECTED Final   Astrovirus NOT DETECTED NOT DETECTED Final   Norovirus GI/GII NOT DETECTED NOT DETECTED Final   Rotavirus A NOT DETECTED NOT DETECTED Final   Sapovirus (I, II, IV, and V) NOT DETECTED NOT DETECTED Final  C difficile quick scan w PCR reflex     Status: Abnormal   Collection Time: 03/06/17  7:49 AM  Result Value Ref Range Status   C Diff antigen POSITIVE (A) NEGATIVE Final   C Diff toxin POSITIVE (A) NEGATIVE Final   C Diff interpretation Toxin producing C. difficile detected.  Final    Comment: CRITICAL RESULT CALLED TO, READ BACK BY AND VERIFIED WITH:  SHANTELLE GREENE AT 1210 03/06/17 SDR   Urine culture     Status: Abnormal   Collection Time: 03/06/17  9:20 AM  Result Value Ref Range Status   Specimen Description URINE, RANDOM  Final   Special Requests NONE  Final   Culture MULTIPLE SPECIES PRESENT, SUGGEST RECOLLECTION (A)  Final   Report Status 03/07/2017 FINAL  Final  Culture, group A strep     Status: None (Preliminary result)   Collection Time: 03/10/17  2:17 PM  Result Value Ref Range Status   Specimen Description THROAT  Final   Special Requests NONE  Final   Culture   Final    CULTURE REINCUBATED FOR BETTER GROWTH Performed at [facility], [place] [address], [place], [place] [address]    Report Status PENDING  Incomplete    RADIOLOGY:  Ct Head Wo Contrast  Result Date: 03/10/2017 CLINICAL DATA:  Mental status change. Difficulty  swallowing and talking beginning 30 minutes ago. Throat swelling. EXAM: CT HEAD WITHOUT CONTRAST CT NECK WITHOUT CONTRAST TECHNIQUE: Contiguous axial images were obtained from the base of the skull through the vertex without contrast. Multidetector CT imaging of the neck was performed using the standard protocol without intravenous contrast. COMPARISON:  CT head without contrast 12/29/2013. FINDINGS: CT HEAD FINDINGS Brain: Mild white matter changes are noted bilaterally. No acute infarct, hemorrhage, or mass  lesion is present. The basal ganglia are within normal limits bilaterally. The insular ribbon is normal. Brainstem and cerebellum are unremarkable. Vascular: No hyperdense vessel or unexpected calcification. Skull: The calvarium is intact. No focal lytic or blastic lesions are present. Sinuses/Orbits: The paranasal sinuses and mastoid air cells are clear. The globes and orbits are within normal limits. CT NECK FINDINGS Pharynx and larynx: No focal mucosal or submucosal lesions are present. The nasopharynx, oropharynx, and hypopharynx are clear. The airway is preserved. Salivary glands: Submandibular and parotid glands are normal bilaterally. Thyroid: The thyroid is atrophic.  No focal lesions are present. Lymph nodes: No significant cervical adenopathy is present. Vascular: No significant vascular calcifications are evident. Mastoids and visualized paranasal sinuses: The visualized paranasal sinuses and mastoid air cells are clear. Skeleton: Endplate degenerative changes are present in the cervical spine from C3-4 through C6-7 there vertebral body heights are maintained. No focal lytic or blastic lesions are present. Upper chest: A ground-glass nodule in the right upper lobe measures 2.3 x 1.6 x 1.4 cm. The lung apices are otherwise clear. The upper mediastinum is unremarkable. IMPRESSION: 1. Stable white matter disease.  No acute intracranial abnormality. 2. 2.3 cm ground-glass nodule in the right upper lobe is worrisome for a primary bronchogenic neoplasm. Recommend CT chest with contrast and PET scan further evaluation. 3. Noncontrast CT the neck is otherwise unremarkable. No other acute or focal lesion to explain the patient's difficulty swallowing. These results will be called to the ordering clinician or representative by the Radiologist Assistant, and communication documented in the PACS or zVision Dashboard. Electronically Signed   By: San Morelle M.D.   On: 03/10/2017 11:09   Ct Soft Tissue Neck  Wo Contrast  Result Date: 03/10/2017 CLINICAL DATA:  Mental status change. Difficulty swallowing and talking beginning 30 minutes ago. Throat swelling. EXAM: CT HEAD WITHOUT CONTRAST CT NECK WITHOUT CONTRAST TECHNIQUE: Contiguous axial images were obtained from the base of the skull through the vertex without contrast. Multidetector CT imaging of the neck was performed using the standard protocol without intravenous contrast. COMPARISON:  CT head without contrast 12/29/2013. FINDINGS: CT HEAD FINDINGS Brain: Mild white matter changes are noted bilaterally. No acute infarct, hemorrhage, or mass lesion is present. The basal ganglia are within normal limits bilaterally. The insular ribbon is normal. Brainstem and cerebellum are unremarkable. Vascular: No hyperdense vessel or unexpected calcification. Skull: The calvarium is intact. No focal lytic or blastic lesions are present. Sinuses/Orbits: The paranasal sinuses and mastoid air cells are clear. The globes and orbits are within normal limits. CT NECK FINDINGS Pharynx and larynx: No focal mucosal or submucosal lesions are present. The nasopharynx, oropharynx, and hypopharynx are clear. The airway is preserved. Salivary glands: Submandibular and parotid glands are normal bilaterally. Thyroid: The thyroid is atrophic.  No focal lesions are present. Lymph nodes: No significant cervical adenopathy is present. Vascular: No significant vascular calcifications are evident. Mastoids and visualized paranasal sinuses: The visualized paranasal sinuses and mastoid air cells are clear. Skeleton: Endplate degenerative changes are present in the cervical  spine from C3-4 through C6-7 there vertebral body heights are maintained. No focal lytic or blastic lesions are present. Upper chest: A ground-glass nodule in the right upper lobe measures 2.3 x 1.6 x 1.4 cm. The lung apices are otherwise clear. The upper mediastinum is unremarkable. IMPRESSION: 1. Stable white matter disease.  No  acute intracranial abnormality. 2. 2.3 cm ground-glass nodule in the right upper lobe is worrisome for a primary bronchogenic neoplasm. Recommend CT chest with contrast and PET scan further evaluation. 3. Noncontrast CT the neck is otherwise unremarkable. No other acute or focal lesion to explain the patient's difficulty swallowing. These results will be called to the ordering clinician or representative by the Radiologist Assistant, and communication documented in the PACS or zVision Dashboard. Electronically Signed   By: San Morelle M.D.   On: 03/10/2017 11:09    EKG:   Orders placed or performed during the hospital encounter of 02/28/17  . ED EKG  . ED EKG  . EKG      Management plans discussed with the patient, family and they are in agreement.  CODE STATUS:     Code Status Orders        Start     Ordered   03/06/17 1208  Full code  Continuous     03/06/17 1207    Code Status History    Date Active Date Inactive Code Status Order ID Comments User Context   This patient has a current code status but no historical code status.    Advance Directive Documentation     Most Recent Value  Type of Advance Directive  Living will  Pre-existing out of facility DNR order (yellow form or pink MOST form)  -  "MOST" Form in Place?  -      TOTAL TIME TAKING CARE OF THIS PATIENT: 45  minutes.   Note: This dictation was prepared with Dragon dictation along with smaller phrase technology. Any transcriptional errors that result from this process are unintentional.   @[facility] @  on 03/12/2017 at 1:40 PM  Between 7am to 6pm - Pager - [phone]  After 6pm go to [email or web address] - password [email or web address] [facility]  Office  [phone]  CC: Primary care physician; System, Pcp Not In

## 2017-03-12 NOTE — Progress Notes (Signed)
The patient is tolerating a by mouth diet. Nothing further to add from a GI point of view.. I will sign off.  Please call if any further GI concerns or questions.  We would like to thank you for the opportunity to participate in the care of Rebecca Conner.

## 2017-03-12 NOTE — Care Management (Signed)
Patient admitted with sepsis related to cdiff.  Patient lives at home alone.  PCP.  Lyn Lam.  No home Medical equipment.  PT has assessed patient and recommended SNF.  Patient has declined and wishes to return with home health services.  Patient was provided home health agency preference.  Patient states that she does not have a preference.  Referral made to [facility] with [facility] for home health PT.  Patient to discharge home today.  RNCM signing off.

## 2017-03-12 NOTE — Discharge Instructions (Signed)
E. Coli Infection E. coli (Escherichia coli) are bacteria that can cause an infection in various parts of your body, including your intestines. E. coli bacteria normally live in the intestines of people and animals. Most types of E. coli do not cause infections, but some produce a poison (toxin) that can cause diarrhea. Depending on the toxin, this can cause mild or severe diarrhea. This condition can spread from person to person (is contagious). Toxin-producing E. coli can also spread from animals to humans. Most cases of E. coli infection come from cows (cattle). In some cases, this infection can cause a dangerous complication called hemolytic uremic syndrome (HUS). What are the causes? Causes of an E. coli intestinal infection include:  Eating raw or undercooked beef from infected cattle.  Touching an infected animal and then touching your mouth.  Eating fruits or vegetables that have come in contact with the feces of infected animals.  Drinking fluids that have been contaminated with E. coli from infected animals.  Coming into contact with a surface that has been contaminated by an infected person.  What increases the risk? This condition is more likely to develop in people who:  Eat raw or undercooked beef.  Drink raw (unpasteurized) milk, cider, or juice.  Are in close contact with cattle, goats, or sheep.  What are the signs or symptoms? Symptoms of this condition usually start about 3-4 days after the bacteria are swallowed (ingested). Symptoms include:  Severe cramps and tenderness in the abdomen.  Diarrhea. This may be watery or bloody.  Nausea and vomiting.  Dehydration. Dehydration can cause fatigue, thirst, a dry mouth, and less frequent urination.  Low fever. This is not common.  How is this diagnosed? This condition is diagnosed with a medical history and physical exam. A stool sample may be taken and tested for E. coli or toxins of E. coli. How is this  treated? Treatment for this condition includes rest and fluids (supportive care). If you have severe diarrhea, you may need to receive fluids through an IV tube. Symptoms of E. coli intestinal infection usually go away in 5-10 days. Some strains of E. coli may be treated with antibiotic or antidiarrheal medicines. However, this treatment is rarely used because these medicines may increase your risk for HUS. Follow these instructions at home: Eating and drinking  Drink enough fluids to keep your urine clear or pale yellow. You may need to drink small amounts of clear liquids frequently.  Ask your health care provider for specific rehydration instructions.  Do not drink milk, caffeine, or alcohol.  Eat small, frequent meals rather than large meals. General instructions  Take over-the-counter and prescription medicines only as told by your health care provider.  Wash your hands thoroughly before and after you prepare food and after you go to the bathroom (use the toilet). Make sure people who live with you also wash their hands often. If soap and water are not available, use hand sanitizer.  Clean surfaces that you touch with a product that contains chlorine bleach.  Keep all follow-up visits as told by your health care provider. This is important. How is this prevented?  Wash your hands often with soap and water. If soap and water are not available, use hand sanitizer. Always wash your hands: ? After you go to the bathroom. ? Before you touch food. ? After you prepare or cook beef. ? After you touch animals at farms, zoos, or fairs.  Do not eat raw or undercooked  beef.  Do not drink unpasteurized milk or eat cheeses that were made with raw milk. Do not drink unpasteurized apple cider.  Wash cutting boards, counters, and utensils after you prepare raw meat.  Wash all fruits and vegetables before you eat or cook them. Contact a health care provider if:  Your symptoms do not get  better with treatment.  You have new symptoms.  Your vomiting or diarrhea gets worse. Get help right away if:  You have increasing pain or tenderness in your abdomen.  You have ongoing (persistent) vomiting or diarrhea.  You have abdominal pain that stays in one area (localizes).  Your diarrhea has more blood in it.  You have a fever.  You cannot eat or drink without vomiting.  You have signs of dehydration or HUS, such as: ? Pale skin. ? Dark urine, very little urine, or no urine. ? Cracked lips. ? Not making tears while crying. ? Dry mouth. ? Sunken eyes. ? Sleepiness. ? Weakness. ? Dizziness. This information is not intended to replace advice given to you by your health care provider. Make sure you discuss any questions you have with your health care provider. Document Released: 04/12/2005 Document Revised: 12/23/2015 Document Reviewed: 01/18/2015 Elsevier Interactive Patient Education  2018 [place] with primary care physician in a week Follow-up with gastroenterology in 2 weeks

## 2017-03-12 NOTE — Progress Notes (Signed)
Speech Language Pathology Treatment: Dysphagia  Patient Details Name: Rebecca Conner MRN: [id number] DOB: 01/29/1951 Today's Date: 03/12/2017 Time: 8676-1950 SLP Time Calculation (min) (ACUTE ONLY): 45 min  Assessment / Plan / Recommendation Clinical Impression  Pt appears to present w/ adequate oropharyngeal phase swallow function w/ no noted, overt s/s of aspiration noted w/ po trials given during this session; no oral phase deficits noted. Upon entering room, noted pt's vocal quality was stronger w/ increased volume - min to no dysphonia. Pt has been following tx for possible Thrush as recommended per MD ordering of medications(swish and swallow, oral swabs). Pt stated she felt "things were better now" and hoped she could swallow today.  Pt fed herself trials of ice chips initially then trials of thin liquids via cup/straw, purees, and softened cut solids; min setup was given and education on following general aspiration precautions which she did during po trials taken. No decline in respiratory status or change in vocal quality apparent w/ po trials given. Oral phase was [facility] for bolus management and clearing w/ liquids and softned foods - pt lacking many dentition. Of note, pt c/o Esophageal phase discomfort at times and described s/s of dysmotility, Reflux. Educated pt on the importance of following general Reflux precautions to allow for easier Esophageal motility and clearing during intake. Pt is at min increased risk for aspiration of REFLUX/regurgitated material thus impacting Pulmonary status as any person could be; needs to continue to f/u w/ GI/MD re: REFLUX discussion and management w/ PPIs as she is only taking her PPI x1 day and use to be on Nexium. Gave brief education on Reflux precautions, handouts. Recommend aspiration precautions w/ any oral intake; dysphagia level 3 diet w/ thin liquids. NSG updated on recommended diet consistency; precautions w/ Pills(puree if easier for  swallowing at this time d/t Esophageal phase discomfort).     HPI HPI: Pt is an 66 y.o. female  with past medical history significant for hypertension as well as hypothyroidism who presented to the hospital with complaints of nausea, vomiting and diarrhea, C. difficile infection. Patient was admitted, now on vancomycin oral solution. This AM pt started to complain of diffuse pressure like headache and difficulty swallowing even her saliva along with hoarse voice. patient had similar symptoms and he came to the ED 2 weeks ago. She was given Cipro and Flagyl, which she took 7 days. But she developed leg cramp pain pain which is the side effect of antibiotics. Her symptoms resoled. But she started to have lower abdominal cramping pain since yesterday, which is constant 8 out of 10 without radiation, associated with nausea, vomiting and diarrhea. Pt has been on multiple Antibiotics for ~1 month per her report. She currently describes sore throat w/ pain and inability to swallow her saliva or anything to eat/drink. Pt has had a CT of the head and Noncontrast CT the neck is otherwise unremarkable. No other acute or focal lesion to explain the patient's difficulty swallowing per report. Pt is verbally conversive w/ little to no dysphonia; A/O x4. She appears stronger in appearance; stated she felt "better now". Noted ENT's note/assessment over the weekend.       SLP Plan  Continue with current plan of care       Recommendations  Diet recommendations: Dysphagia 3 (mechanical soft);Thin liquid Liquids provided via: Cup;Straw Medication Administration: Whole meds with puree (for easier swallowing) Supervision: Patient able to self feed;Intermittent supervision to cue for compensatory strategies Compensations: Minimize environmental distractions;Slow rate;Small sips/bites;Lingual sweep  for clearance of pocketing;Multiple dry swallows after each bite/sip;Follow solids with liquid Postural Changes and/or  Swallow Maneuvers: Seated upright 90 degrees;Upright 30-60 min after meal                General recommendations:  (GI consult for Reflux management) Oral Care Recommendations: Oral care BID;Patient independent with oral care;Staff/trained caregiver to provide oral care Follow up Recommendations: None SLP Visit Diagnosis: Dysphagia, pharyngoesophageal phase (R13.14) Plan: Continue with current plan of care       Monson, [place]na, CCC-SLP Alithia Zavaleta 03/12/2017, 12:07 PM

## 2017-03-12 NOTE — Clinical Social Work Note (Signed)
CSW received referral for SNF.  Case discussed with case manager and plan is to discharge home with home health.  CSW to sign off please re-consult if social work needs arise.  Jameisha Stofko R. Khaliel Morey, MSW, LCSWA [phone]  

## 2017-03-13 LAB — CULTURE, GROUP A STREP (THRC)

## 2017-03-14 DIAGNOSIS — Z7982 Long term (current) use of aspirin: Secondary | ICD-10-CM | POA: Diagnosis not present

## 2017-03-14 DIAGNOSIS — A0472 Enterocolitis due to Clostridium difficile, not specified as recurrent: Secondary | ICD-10-CM | POA: Diagnosis not present

## 2017-03-14 DIAGNOSIS — E039 Hypothyroidism, unspecified: Secondary | ICD-10-CM | POA: Diagnosis not present

## 2017-03-14 DIAGNOSIS — I10 Essential (primary) hypertension: Secondary | ICD-10-CM | POA: Diagnosis not present

## 2017-03-15 ENCOUNTER — Encounter: Payer: Self-pay | Admitting: Gastroenterology

## 2017-03-15 ENCOUNTER — Ambulatory Visit (INDEPENDENT_AMBULATORY_CARE_PROVIDER_SITE_OTHER): Payer: Medicare HMO | Admitting: Gastroenterology

## 2017-03-15 VITALS — HR 53 | Temp 98.1°F | Ht 65.0 in | Wt 177.8 lb

## 2017-03-15 DIAGNOSIS — R197 Diarrhea, unspecified: Secondary | ICD-10-CM | POA: Diagnosis not present

## 2017-03-15 DIAGNOSIS — B9689 Other specified bacterial agents as the cause of diseases classified elsewhere: Secondary | ICD-10-CM | POA: Diagnosis not present

## 2017-03-15 DIAGNOSIS — A498 Other bacterial infections of unspecified site: Secondary | ICD-10-CM | POA: Insufficient documentation

## 2017-03-15 NOTE — Progress Notes (Signed)
Cephas Darby, MD [address]  [place]  [place], [place] [address]  Main: [phone]  Fax: [phone]    Gastroenterology Consultation  Referring Provider:     No ref. provider found Primary Care Physician:  System, Pcp Not In Primary Gastroenterologist:  Dr. Cephas Darby Reason for Consultation:     Clostridium difficile infection        HPI:   Rebecca Conner is a 66 y.o. y/o female is here as a hospital follow-up with recent admission for Clostridium difficile infection. Her initial presentation of diarrhea was started in July, at that time she went to the ER and she was discharged on Cipro and Flagyl. She developed leg cramps, and presented to ER on 02/28/2017, she was told to stop the antibiotics. Her diarrhea persisted, she became progressively weak, associated with worsening abdominal pain and presented to the ER again on 03/06/2017. She was tested for C. difficile that time and it was positive. She was started on oral vancomycin since then. She meets the criteria for severe C. difficile infection based on elevated WBC count to more than 15 K. CT A/P did not reveal inflammation in colon. She was feeling better at the time of discharge on 8/13. She is here for follow-up accompanied by her sister. She continues to improve gradually in her symptoms such as abdominal pain and diarrhea. However, she had 4 episodes of loose watery stools this morning. She has been consuming boost 3 times a day,  nectar thick liquid diet only like cream of wheat etc. She has been taking Prilosec for intermittent heartburn. She works in a Estate manager/land agent. She denies fever, chills, nausea, vomiting, loss of appetite, blood in stools, abdominal pain. Her leukocytosis has improved at the time of discharge.  GI Procedures: Colonoscopy at [facility] 08/23/2012 reportedly normal  He denies smoking, alcohol, NSAIDs use  Past Medical History:  Diagnosis Date  . Disease of thyroid gland   . Hypertension      Past Surgical History:  Procedure Laterality Date  . ABDOMINAL HYSTERECTOMY    . APPENDECTOMY    . BREAST CYST EXCISION    . CHOLECYSTECTOMY    . THYROID SURGERY      Prior to Admission medications   Medication Sig Start Date End Date Taking? Authorizing Provider  acidophilus (RISAQUAD) CAPS capsule Take 2 capsules by mouth 3 (three) times daily. 03/12/17  Yes Gouru, Illene Silver, MD  aspirin EC 81 MG tablet Take 81 mg by mouth daily.   Yes [provider]  cyanocobalamin 1000 MCG tablet Take 1,000 mcg by mouth daily.   Yes [provider]  feeding supplement (BOOST / RESOURCE BREEZE) LIQD Take 1 Container by mouth 3 (three) times daily between meals. 03/12/17  Yes Gouru, Illene Silver, MD  levothyroxine (SYNTHROID, LEVOTHROID) 125 MCG tablet Take 1 tablet by mouth daily. 12/07/11  Yes [provider]  nystatin (MYCOSTATIN) 100000 UNIT/ML suspension Use as directed 5 mLs (500,000 Units total) in the mouth or throat 4 (four) times daily. 03/12/17  Yes Gouru, Illene Silver, MD  omeprazole (PRILOSEC) 40 MG capsule Take 1 capsule (40 mg total) by mouth daily before breakfast. 03/12/17  Yes Gouru, Aruna, MD  sucralfate (CARAFATE) 1 GM/10ML suspension Take 10 mLs (1 g total) by mouth 4 (four) times daily -  with meals and at bedtime. 03/12/17  Yes Gouru, Illene Silver, MD  vancomycin (VANCOCIN HCL) 125 MG capsule Take 1 capsule (125 mg total) by mouth 4 (four) times daily. 03/12/17  Yes Gouru, Aruna, MD  esomeprazole (NEXIUM) 20 MG capsule Take by mouth. 12/07/11   [provider]  oxyCODONE-acetaminophen (ROXICET) 5-325 MG tablet Take 1 tablet by mouth every 4 (four) hours as needed for severe pain. Patient not taking: Reported on 03/06/2017 02/22/17   Gregor Hams, MD  pravastatin (PRAVACHOL) 40 MG tablet Take 40 mg by mouth daily. 08/23/12   [provider]    Family History  Problem Relation Age of Onset  . Cancer Father   . Hypertension Father   . Stroke Father   . Heart  attack Father   . Stroke Mother   . Breast cancer Sister   . Heart attack Brother   . Hypertension Brother      Social History  Substance Use Topics  . Smoking status: Never Smoker  . Smokeless tobacco: Never Used  . Alcohol use No    Allergies as of 03/15/2017 - Review Complete 03/15/2017  Allergen Reaction Noted  . Guaifenesin Shortness Of Breath 11/04/2012  . Lisinopril Shortness Of Breath and Palpitations 03/06/2017  . Tetanus toxoids Anaphylaxis 11/04/2012  . Ciprofloxacin  03/06/2017  . Fluticasone  03/06/2017  . Imodium [loperamide] Itching 03/06/2017  . Lovastatin  03/06/2017  . Metronidazole  03/06/2017  . Paroxetine Nausea Only 03/06/2017  . Rosuvastatin calcium Itching 03/06/2017  . Motrin [ibuprofen] Rash 02/21/2017    Review of Systems:    All systems reviewed and negative except where noted in HPI.   Physical Exam:  Pulse (!) 53   Temp 98.1 F (36.7 C) (Oral)   Ht 5\' 5"  (1.651 m)   Wt 80.6 kg (177 lb 12.8 oz)   BMI 29.59 kg/m  No LMP recorded. Patient has had a hysterectomy.  General:   Alert,  Well-developed, well-nourished, pleasant and cooperative in NAD Head:  Normocephalic and atraumatic. Eyes:  Sclera clear, no icterus.   Conjunctiva pink. Ears:  Normal auditory acuity. Nose:  No deformity, discharge, or lesions. Mouth:  No deformity or lesions,oropharynx pink & moist. Neck:  Supple; no masses or thyromegaly. Lungs:  Respirations even and unlabored.  Clear throughout to auscultation.   No wheezes, crackles, or rhonchi. No acute distress. Heart:  Regular rate and rhythm; no murmurs, clicks, rubs, or gallops. Abdomen:  Normal bowel sounds.  No bruits.  Soft, non-tender and non-distended without masses, hepatosplenomegaly or hernias noted.  No guarding or rebound tenderness.    Msk:  Symmetrical without gross deformities. Good, equal movement & strength bilaterally. Pulses:  Normal pulses noted. Extremities:  No clubbing or edema.  No  cyanosis. Neurologic:  Alert and oriented x3;  grossly normal neurologically. Skin:  Intact without significant lesions or rashes. No jaundice. Lymph Nodes:  No significant cervical adenopathy. Psych:  Alert and cooperative. Normal mood and affect.  Imaging Studies: Ct Head Wo Contrast  Result Date: 03/10/2017 CLINICAL DATA:  Mental status change. Difficulty swallowing and talking beginning 30 minutes ago. Throat swelling. EXAM: CT HEAD WITHOUT CONTRAST CT NECK WITHOUT CONTRAST TECHNIQUE: Contiguous axial images were obtained from the base of the skull through the vertex without contrast. Multidetector CT imaging of the neck was performed using the standard protocol without intravenous contrast. COMPARISON:  CT head without contrast 12/29/2013. FINDINGS: CT HEAD FINDINGS Brain: Mild white matter changes are noted bilaterally. No acute infarct, hemorrhage, or mass lesion is present. The basal ganglia are within normal limits bilaterally. The insular ribbon is normal. Brainstem and cerebellum are unremarkable. Vascular: No hyperdense vessel or unexpected calcification. Skull:  The calvarium is intact. No focal lytic or blastic lesions are present. Sinuses/Orbits: The paranasal sinuses and mastoid air cells are clear. The globes and orbits are within normal limits. CT NECK FINDINGS Pharynx and larynx: No focal mucosal or submucosal lesions are present. The nasopharynx, oropharynx, and hypopharynx are clear. The airway is preserved. Salivary glands: Submandibular and parotid glands are normal bilaterally. Thyroid: The thyroid is atrophic.  No focal lesions are present. Lymph nodes: No significant cervical adenopathy is present. Vascular: No significant vascular calcifications are evident. Mastoids and visualized paranasal sinuses: The visualized paranasal sinuses and mastoid air cells are clear. Skeleton: Endplate degenerative changes are present in the cervical spine from C3-4 through C6-7 there vertebral body  heights are maintained. No focal lytic or blastic lesions are present. Upper chest: A ground-glass nodule in the right upper lobe measures 2.3 x 1.6 x 1.4 cm. The lung apices are otherwise clear. The upper mediastinum is unremarkable. IMPRESSION: 1. Stable white matter disease.  No acute intracranial abnormality. 2. 2.3 cm ground-glass nodule in the right upper lobe is worrisome for a primary bronchogenic neoplasm. Recommend CT chest with contrast and PET scan further evaluation. 3. Noncontrast CT the neck is otherwise unremarkable. No other acute or focal lesion to explain the patient's difficulty swallowing. These results will be called to the ordering clinician or representative by the Radiologist Assistant, and communication documented in the PACS or zVision Dashboard. Electronically Signed   By: San Morelle M.D.   On: 03/10/2017 11:09   Ct Soft Tissue Neck Wo Contrast  Result Date: 03/10/2017 CLINICAL DATA:  Mental status change. Difficulty swallowing and talking beginning 30 minutes ago. Throat swelling. EXAM: CT HEAD WITHOUT CONTRAST CT NECK WITHOUT CONTRAST TECHNIQUE: Contiguous axial images were obtained from the base of the skull through the vertex without contrast. Multidetector CT imaging of the neck was performed using the standard protocol without intravenous contrast. COMPARISON:  CT head without contrast 12/29/2013. FINDINGS: CT HEAD FINDINGS Brain: Mild white matter changes are noted bilaterally. No acute infarct, hemorrhage, or mass lesion is present. The basal ganglia are within normal limits bilaterally. The insular ribbon is normal. Brainstem and cerebellum are unremarkable. Vascular: No hyperdense vessel or unexpected calcification. Skull: The calvarium is intact. No focal lytic or blastic lesions are present. Sinuses/Orbits: The paranasal sinuses and mastoid air cells are clear. The globes and orbits are within normal limits. CT NECK FINDINGS Pharynx and larynx: No focal mucosal  or submucosal lesions are present. The nasopharynx, oropharynx, and hypopharynx are clear. The airway is preserved. Salivary glands: Submandibular and parotid glands are normal bilaterally. Thyroid: The thyroid is atrophic.  No focal lesions are present. Lymph nodes: No significant cervical adenopathy is present. Vascular: No significant vascular calcifications are evident. Mastoids and visualized paranasal sinuses: The visualized paranasal sinuses and mastoid air cells are clear. Skeleton: Endplate degenerative changes are present in the cervical spine from C3-4 through C6-7 there vertebral body heights are maintained. No focal lytic or blastic lesions are present. Upper chest: A ground-glass nodule in the right upper lobe measures 2.3 x 1.6 x 1.4 cm. The lung apices are otherwise clear. The upper mediastinum is unremarkable. IMPRESSION: 1. Stable white matter disease.  No acute intracranial abnormality. 2. 2.3 cm ground-glass nodule in the right upper lobe is worrisome for a primary bronchogenic neoplasm. Recommend CT chest with contrast and PET scan further evaluation. 3. Noncontrast CT the neck is otherwise unremarkable. No other acute or focal lesion to explain the patient's difficulty  swallowing. These results will be called to the ordering clinician or representative by the Radiologist Assistant, and communication documented in the PACS or zVision Dashboard. Electronically Signed   By: San Morelle M.D.   On: 03/10/2017 11:09   Ct Abdomen Pelvis W Contrast  Result Date: 03/06/2017 CLINICAL DATA:  Abdominal pain with nausea and vomiting. EXAM: CT ABDOMEN AND PELVIS WITH CONTRAST TECHNIQUE: Multidetector CT imaging of the abdomen and pelvis was performed using the standard protocol following bolus administration of intravenous contrast. CONTRAST:  133mL ISOVUE-300 IOPAMIDOL (ISOVUE-300) INJECTION 61% COMPARISON:  02/22/2017 FINDINGS: Lower chest: No acute abnormality. Hepatobiliary: No focal liver  abnormality is seen. Status post cholecystectomy. No biliary dilatation. Pancreas: Unremarkable. No pancreatic ductal dilatation or surrounding inflammatory changes. Spleen: Normal in size without focal abnormality. Adrenals/Urinary Tract: Normal except for a 6 mm cyst on the lateral aspect of the upper pole of the left kidney. Stomach/Bowel: Stomach is within normal limits. Appendix has been removed. No evidence of bowel wall thickening, distention, or inflammatory changes. Vascular/Lymphatic: No significant vascular findings are present. No enlarged abdominal or pelvic lymph nodes. Reproductive: Status post hysterectomy. No adnexal masses. Left ovary is visible and appears normal. Right ovary is not discretely identified. Other: No abdominal wall hernia or abnormality. No abdominopelvic ascites. Musculoskeletal: No acute or significant osseous findings. IMPRESSION: Benign-appearing abdomen and pelvis. Electronically Signed   By: Lorriane Shire M.D.   On: 03/06/2017 10:22   Ct Abdomen Pelvis W Contrast  Result Date: 02/22/2017 CLINICAL DATA:  Pain in the right upper quadrant, epigastric, and left upper quadrant abdominal regions. Diarrhea. Symptoms for 2 weeks. EXAM: CT ABDOMEN AND PELVIS WITH CONTRAST TECHNIQUE: Multidetector CT imaging of the abdomen and pelvis was performed using the standard protocol following bolus administration of intravenous contrast. CONTRAST:  167mL ISOVUE-300 IOPAMIDOL (ISOVUE-300) INJECTION 61% COMPARISON:  None. FINDINGS: Lower chest: Atelectasis and motion artifact in the lung bases. Hepatobiliary: No focal liver abnormality is seen. Status post cholecystectomy. No biliary dilatation. Pancreas: Unremarkable. No pancreatic ductal dilatation or surrounding inflammatory changes. Spleen: Normal in size without focal abnormality. Adrenals/Urinary Tract: Adrenal glands are unremarkable. Kidneys are normal, without renal calculi, focal lesion, or hydronephrosis. Bladder is unremarkable.  Stomach/Bowel: Stomach is within normal limits. Appendix appears normal. No evidence of bowel wall thickening, distention, or inflammatory changes. Vascular/Lymphatic: No significant vascular findings are present. No enlarged abdominal or pelvic lymph nodes. Reproductive: Status post hysterectomy. No adnexal masses. Other: No abdominal wall hernia or abnormality. No abdominopelvic ascites. Musculoskeletal: No acute or significant osseous findings. IMPRESSION: No acute process demonstrated in the abdomen or pelvis. No evidence of bowel obstruction or inflammation. Electronically Signed   By: Lucienne Capers M.D.   On: 02/22/2017 01:53   Dg Chest Port 1 View  Result Date: 03/06/2017 CLINICAL DATA:  Acute generalized abdominal pain. EXAM: PORTABLE CHEST 1 VIEW COMPARISON:  Radiographs of July 07, 2014. FINDINGS: The heart size and mediastinal contours are within normal limits. Both lungs are clear. No pneumothorax or pleural effusion is noted. The visualized skeletal structures are unremarkable. IMPRESSION: No acute cardiopulmonary abnormality seen. Electronically Signed   By: Marijo Conception, M.D.   On: 03/06/2017 09:10    Assessment and Plan:   Rebecca Conner is a 66 y.o. y/o female with Clostridium difficile infection here as a hospital follow-up. She has severe C. difficile infection. She has risk factors for C. difficile infection such as age, PPI use, working in a senior center. -Complete 10 days course of  oral vancomycin -Stop Prilosec and start Zantac or Pepcid daily for heartburn -She can stop boost supplements and probiotic as they might make diarrhea worse -I encouraged her to resume regular diet with lean protein and well cooked vegetables, avoid red meat -If symptoms are getting worse or remain the same by next week, I will repeat C. difficile stool test at that time, if positive will start fidaxomicin.   Follow up in 4 weeks   Cephas Darby, MD

## 2017-03-15 NOTE — Patient Instructions (Signed)
1.Complete the course of oral vancomycin 2.Stop prilosec, start xantac or pepcid daily 3.Stop boost supplements and probiotic 4.Resume regular diet with lean protein and well cooked vegetables, avoid redmeat 5.If symptoms are getting worse or the same by next week, call our office to check stool for C Diff  Please call our office to speak with my nurse Driscilla Grammes at [phone] during business hours from 8am to 4pm if you have any questions/concerns. During after hours, you will be redirected to on call GI physician. For any emergency please call 911 or go the nearest emergency room.    Cephas Darby, MD [address]  [place]  [place], [place] [address]  Main: [phone]  Fax: [phone]

## 2017-03-17 DIAGNOSIS — Z7982 Long term (current) use of aspirin: Secondary | ICD-10-CM | POA: Diagnosis not present

## 2017-03-17 DIAGNOSIS — I10 Essential (primary) hypertension: Secondary | ICD-10-CM | POA: Diagnosis not present

## 2017-03-17 DIAGNOSIS — E039 Hypothyroidism, unspecified: Secondary | ICD-10-CM | POA: Diagnosis not present

## 2017-03-17 DIAGNOSIS — A0472 Enterocolitis due to Clostridium difficile, not specified as recurrent: Secondary | ICD-10-CM | POA: Diagnosis not present

## 2017-03-21 DIAGNOSIS — A0472 Enterocolitis due to Clostridium difficile, not specified as recurrent: Secondary | ICD-10-CM | POA: Diagnosis not present

## 2017-03-21 DIAGNOSIS — I10 Essential (primary) hypertension: Secondary | ICD-10-CM | POA: Diagnosis not present

## 2017-03-21 DIAGNOSIS — Z7982 Long term (current) use of aspirin: Secondary | ICD-10-CM | POA: Diagnosis not present

## 2017-03-21 DIAGNOSIS — E039 Hypothyroidism, unspecified: Secondary | ICD-10-CM | POA: Diagnosis not present

## 2017-03-22 DIAGNOSIS — R911 Solitary pulmonary nodule: Secondary | ICD-10-CM | POA: Diagnosis not present

## 2017-03-22 DIAGNOSIS — Z6829 Body mass index (BMI) 29.0-29.9, adult: Secondary | ICD-10-CM | POA: Diagnosis not present

## 2017-03-22 DIAGNOSIS — Z79899 Other long term (current) drug therapy: Secondary | ICD-10-CM | POA: Diagnosis not present

## 2017-03-22 DIAGNOSIS — A0472 Enterocolitis due to Clostridium difficile, not specified as recurrent: Secondary | ICD-10-CM | POA: Diagnosis not present

## 2017-03-22 DIAGNOSIS — R7303 Prediabetes: Secondary | ICD-10-CM | POA: Diagnosis not present

## 2017-03-22 DIAGNOSIS — Z09 Encounter for follow-up examination after completed treatment for conditions other than malignant neoplasm: Secondary | ICD-10-CM | POA: Diagnosis not present

## 2017-03-22 DIAGNOSIS — E890 Postprocedural hypothyroidism: Secondary | ICD-10-CM | POA: Diagnosis not present

## 2017-03-22 DIAGNOSIS — K219 Gastro-esophageal reflux disease without esophagitis: Secondary | ICD-10-CM | POA: Diagnosis not present

## 2017-03-23 DIAGNOSIS — A0472 Enterocolitis due to Clostridium difficile, not specified as recurrent: Secondary | ICD-10-CM | POA: Diagnosis not present

## 2017-03-23 DIAGNOSIS — I10 Essential (primary) hypertension: Secondary | ICD-10-CM | POA: Diagnosis not present

## 2017-03-23 DIAGNOSIS — E039 Hypothyroidism, unspecified: Secondary | ICD-10-CM | POA: Diagnosis not present

## 2017-03-23 DIAGNOSIS — Z7982 Long term (current) use of aspirin: Secondary | ICD-10-CM | POA: Diagnosis not present

## 2017-03-28 DIAGNOSIS — E039 Hypothyroidism, unspecified: Secondary | ICD-10-CM | POA: Diagnosis not present

## 2017-03-28 DIAGNOSIS — A0472 Enterocolitis due to Clostridium difficile, not specified as recurrent: Secondary | ICD-10-CM | POA: Diagnosis not present

## 2017-03-28 DIAGNOSIS — I10 Essential (primary) hypertension: Secondary | ICD-10-CM | POA: Diagnosis not present

## 2017-03-28 DIAGNOSIS — Z7982 Long term (current) use of aspirin: Secondary | ICD-10-CM | POA: Diagnosis not present

## 2017-03-29 DIAGNOSIS — A0472 Enterocolitis due to Clostridium difficile, not specified as recurrent: Secondary | ICD-10-CM | POA: Diagnosis not present

## 2017-03-29 DIAGNOSIS — Z7982 Long term (current) use of aspirin: Secondary | ICD-10-CM | POA: Diagnosis not present

## 2017-03-29 DIAGNOSIS — E039 Hypothyroidism, unspecified: Secondary | ICD-10-CM | POA: Diagnosis not present

## 2017-03-29 DIAGNOSIS — I10 Essential (primary) hypertension: Secondary | ICD-10-CM | POA: Diagnosis not present

## 2017-03-30 DIAGNOSIS — A0472 Enterocolitis due to Clostridium difficile, not specified as recurrent: Secondary | ICD-10-CM | POA: Diagnosis not present

## 2017-03-30 DIAGNOSIS — E039 Hypothyroidism, unspecified: Secondary | ICD-10-CM | POA: Diagnosis not present

## 2017-03-30 DIAGNOSIS — I10 Essential (primary) hypertension: Secondary | ICD-10-CM | POA: Diagnosis not present

## 2017-03-30 DIAGNOSIS — Z7982 Long term (current) use of aspirin: Secondary | ICD-10-CM | POA: Diagnosis not present

## 2017-04-03 DIAGNOSIS — I10 Essential (primary) hypertension: Secondary | ICD-10-CM | POA: Diagnosis not present

## 2017-04-03 DIAGNOSIS — E039 Hypothyroidism, unspecified: Secondary | ICD-10-CM | POA: Diagnosis not present

## 2017-04-03 DIAGNOSIS — A0472 Enterocolitis due to Clostridium difficile, not specified as recurrent: Secondary | ICD-10-CM | POA: Diagnosis not present

## 2017-04-03 DIAGNOSIS — Z7982 Long term (current) use of aspirin: Secondary | ICD-10-CM | POA: Diagnosis not present

## 2017-04-05 DIAGNOSIS — Z7982 Long term (current) use of aspirin: Secondary | ICD-10-CM | POA: Diagnosis not present

## 2017-04-05 DIAGNOSIS — A0472 Enterocolitis due to Clostridium difficile, not specified as recurrent: Secondary | ICD-10-CM | POA: Diagnosis not present

## 2017-04-05 DIAGNOSIS — R911 Solitary pulmonary nodule: Secondary | ICD-10-CM | POA: Diagnosis not present

## 2017-04-05 DIAGNOSIS — E039 Hypothyroidism, unspecified: Secondary | ICD-10-CM | POA: Diagnosis not present

## 2017-04-05 DIAGNOSIS — Z6828 Body mass index (BMI) 28.0-28.9, adult: Secondary | ICD-10-CM | POA: Diagnosis not present

## 2017-04-05 DIAGNOSIS — K219 Gastro-esophageal reflux disease without esophagitis: Secondary | ICD-10-CM | POA: Diagnosis not present

## 2017-04-05 DIAGNOSIS — E890 Postprocedural hypothyroidism: Secondary | ICD-10-CM | POA: Diagnosis not present

## 2017-04-05 DIAGNOSIS — E7800 Pure hypercholesterolemia, unspecified: Secondary | ICD-10-CM | POA: Diagnosis not present

## 2017-04-05 DIAGNOSIS — J309 Allergic rhinitis, unspecified: Secondary | ICD-10-CM | POA: Diagnosis not present

## 2017-04-05 DIAGNOSIS — I10 Essential (primary) hypertension: Secondary | ICD-10-CM | POA: Diagnosis not present

## 2017-04-12 ENCOUNTER — Ambulatory Visit (INDEPENDENT_AMBULATORY_CARE_PROVIDER_SITE_OTHER): Payer: Medicare HMO | Admitting: Gastroenterology

## 2017-04-12 ENCOUNTER — Encounter: Payer: Self-pay | Admitting: Gastroenterology

## 2017-04-12 ENCOUNTER — Encounter (INDEPENDENT_AMBULATORY_CARE_PROVIDER_SITE_OTHER): Payer: Self-pay

## 2017-04-12 ENCOUNTER — Other Ambulatory Visit: Payer: Self-pay

## 2017-04-12 VITALS — BP 128/82 | HR 87 | Temp 97.7°F | Ht 65.0 in | Wt 179.6 lb

## 2017-04-12 DIAGNOSIS — K5904 Chronic idiopathic constipation: Secondary | ICD-10-CM

## 2017-04-12 NOTE — Progress Notes (Signed)
Rebecca Darby, MD [address]  [place]  [place], [place] [address]  Main: [phone]  Fax: [phone] Pager: [phone]   Primary Care Physician: None  Primary Gastroenterologist:  Dr. Cephas Conner  Chief Complaint  Patient presents with  . follow up c-diff   Initial visit 03/15/2017:  HPI: Rebecca Conner is a 66 y.o. female was seen as a hospital f/u for severe C Diff infection. When I saw her in clinic, she was still having abdominal pain and diarrhea but overall she was improving. She denied fever, chills, nausea, vomiting, loss of appetite, blood in stools, abdominal pain. I recommended her to stop prilosec, finish C Diff treatment with vancomycin, stop protein supplements.  Follow up visit 04/12/2017:  She reports that her C Diff has resolved. She is having 1-2 formed BMs /day. Denies fever, chills, nausea, vomiting, loss of appetite, blood in stools, abdominal pain. Her energy levels are not optimal yet. She is going back to work. She is asking what to do if she develops constipation. She used to have 1 BM every 3weeks prior to C Diff infection.   GI Procedures: Colonoscopy at [facility] 08/23/2012 reportedly normal  She denies smoking, alcohol, NSAIDs use  Current Outpatient Prescriptions  Medication Sig Dispense Refill  . aspirin EC 81 MG tablet Take 81 mg by mouth daily.    . cyanocobalamin 1000 MCG tablet Take 1,000 mcg by mouth daily.    Marland Kitchen ezetimibe (ZETIA) 10 MG tablet     . oxyCODONE-acetaminophen (ROXICET) 5-325 MG tablet Take 1 tablet by mouth every 4 (four) hours as needed for severe pain. 20 tablet 0  . pantoprazole (PROTONIX) 40 MG tablet     . pravastatin (PRAVACHOL) 40 MG tablet Take 40 mg by mouth daily.    . sucralfate (CARAFATE) 1 GM/10ML suspension Take 10 mLs (1 g total) by mouth 4 (four) times daily -  with meals and at bedtime. 420 mL 0  . SYNTHROID 112 MCG tablet     . fluticasone (FLONASE) 50 MCG/ACT nasal spray      No current  facility-administered medications for this visit.     Allergies as of 04/12/2017 - Review Complete 04/12/2017  Allergen Reaction Noted  . Guaifenesin Shortness Of Breath 11/04/2012  . Lisinopril Shortness Of Breath and Palpitations 03/06/2017  . Tetanus toxoids Anaphylaxis 11/04/2012  . Ciprofloxacin  03/06/2017  . Fluticasone  03/06/2017  . Imodium [loperamide] Itching 03/06/2017  . Lovastatin  03/06/2017  . Metronidazole  03/06/2017  . Paroxetine Nausea Only 03/06/2017  . Rosuvastatin calcium Itching 03/06/2017  . Motrin [ibuprofen] Rash 02/21/2017    ROS:  General: Negative for anorexia, weight loss, fever, chills, fatigue, weakness. ENT: Negative for hoarseness, difficulty swallowing , nasal congestion. CV: Negative for chest pain, angina, palpitations, dyspnea on exertion, peripheral edema.  Respiratory: Negative for dyspnea at rest, dyspnea on exertion, cough, sputum, wheezing.  GI: See history of present illness. GU:  Negative for dysuria, hematuria, urinary incontinence, urinary frequency, nocturnal urination.  Endo: Negative for unusual weight change.    Physical Examination:   BP 128/82   Pulse 87   Temp 97.7 F (36.5 C) (Oral)   Ht 5\' 5"  (1.651 m)   Wt 179 lb 9.6 oz (81.5 kg)   BMI 29.89 kg/m   General: Well-nourished, well-developed in no acute distress.  Eyes: No icterus. Conjunctivae pink. Mouth: Oropharyngeal mucosa moist and pink , no lesions erythema or exudate. Lungs: Clear to auscultation bilaterally. Non-labored. Heart:  Regular rate and rhythm, no murmurs rubs or gallops.  Abdomen: Bowel sounds are normal, nontender, nondistended, no hepatosplenomegaly or masses, no abdominal bruits or hernia , no rebound or guarding.   Extremities: No lower extremity edema. No clubbing or deformities. Neuro: Alert and oriented x 3.  Grossly intact. Skin: Warm and dry, no jaundice.   Psych: Alert and cooperative, normal mood and affect.   Imaging  Studies: Reviewed  Assessment and Plan:   Rebecca Conner is a 66 y.o. y/o female with severe C Diff infection s/p treatment with oral vancomycin for 2 weeks. Her symptoms have resolved. She does report h/o constipation. Recommend, miralax 17gm 2-3 times daily if it recurs. She reports she is due for colonoscopy next year even though they didn't find any polyps  Follow up as needed   Dr Sherri Sear, MD

## 2017-04-12 NOTE — Addendum Note (Signed)
Addended by: Cephas Darby on: 04/12/2017 03:37 PM   Modules accepted: Level of Service

## 2017-04-17 DIAGNOSIS — R918 Other nonspecific abnormal finding of lung field: Secondary | ICD-10-CM | POA: Diagnosis not present

## 2017-04-17 DIAGNOSIS — R911 Solitary pulmonary nodule: Secondary | ICD-10-CM | POA: Diagnosis not present

## 2017-04-18 ENCOUNTER — Ambulatory Visit: Payer: Medicare HMO | Admitting: Gastroenterology

## 2017-05-22 DIAGNOSIS — E876 Hypokalemia: Secondary | ICD-10-CM | POA: Diagnosis not present

## 2017-05-22 DIAGNOSIS — E039 Hypothyroidism, unspecified: Secondary | ICD-10-CM | POA: Insufficient documentation

## 2017-05-22 DIAGNOSIS — E785 Hyperlipidemia, unspecified: Secondary | ICD-10-CM | POA: Insufficient documentation

## 2017-05-22 DIAGNOSIS — R030 Elevated blood-pressure reading, without diagnosis of hypertension: Secondary | ICD-10-CM | POA: Diagnosis not present

## 2017-05-22 DIAGNOSIS — E782 Mixed hyperlipidemia: Secondary | ICD-10-CM | POA: Insufficient documentation

## 2017-05-22 DIAGNOSIS — Z23 Encounter for immunization: Secondary | ICD-10-CM | POA: Diagnosis not present

## 2017-05-22 DIAGNOSIS — K219 Gastro-esophageal reflux disease without esophagitis: Secondary | ICD-10-CM | POA: Insufficient documentation

## 2017-05-22 DIAGNOSIS — E7800 Pure hypercholesterolemia, unspecified: Secondary | ICD-10-CM | POA: Diagnosis not present

## 2017-05-22 DIAGNOSIS — R7303 Prediabetes: Secondary | ICD-10-CM | POA: Diagnosis not present

## 2017-05-28 DIAGNOSIS — E039 Hypothyroidism, unspecified: Secondary | ICD-10-CM | POA: Diagnosis not present

## 2017-05-28 DIAGNOSIS — R7303 Prediabetes: Secondary | ICD-10-CM | POA: Diagnosis not present

## 2017-05-28 DIAGNOSIS — Z0000 Encounter for general adult medical examination without abnormal findings: Secondary | ICD-10-CM | POA: Diagnosis not present

## 2017-05-28 DIAGNOSIS — E876 Hypokalemia: Secondary | ICD-10-CM | POA: Diagnosis not present

## 2017-05-28 DIAGNOSIS — E7800 Pure hypercholesterolemia, unspecified: Secondary | ICD-10-CM | POA: Diagnosis not present

## 2017-09-17 DIAGNOSIS — J159 Unspecified bacterial pneumonia: Secondary | ICD-10-CM | POA: Diagnosis not present

## 2017-09-17 DIAGNOSIS — R05 Cough: Secondary | ICD-10-CM | POA: Diagnosis not present

## 2018-01-28 ENCOUNTER — Other Ambulatory Visit: Payer: Self-pay | Admitting: Family Medicine

## 2018-01-28 DIAGNOSIS — Z1231 Encounter for screening mammogram for malignant neoplasm of breast: Secondary | ICD-10-CM

## 2018-02-15 ENCOUNTER — Ambulatory Visit
Admission: RE | Admit: 2018-02-15 | Discharge: 2018-02-15 | Disposition: A | Discharge status: Home / Self Care | Payer: Medicare HMO | Source: Ambulatory Visit | Attending: Family Medicine | Admitting: Family Medicine

## 2018-02-15 DIAGNOSIS — Z1231 Encounter for screening mammogram for malignant neoplasm of breast: Secondary | ICD-10-CM | POA: Diagnosis not present

## 2018-02-22 ENCOUNTER — Inpatient Hospital Stay
Admission: RE | Admit: 2018-02-22 | Discharge: 2018-02-22 | Disposition: A | Discharge status: Home / Self Care | Payer: Self-pay | Source: Ambulatory Visit | Attending: *Deleted | Admitting: *Deleted

## 2018-02-22 ENCOUNTER — Other Ambulatory Visit: Payer: Self-pay | Admitting: *Deleted

## 2018-02-22 DIAGNOSIS — Z9289 Personal history of other medical treatment: Secondary | ICD-10-CM

## 2018-03-08 ENCOUNTER — Encounter: Payer: Self-pay | Admitting: Emergency Medicine

## 2018-03-11 ENCOUNTER — Ambulatory Visit
Admission: RE | Admit: 2018-03-11 | Discharge: 2018-03-11 | Disposition: A | Discharge status: Home / Self Care | Payer: Medicare HMO | Source: Ambulatory Visit | Attending: Gastroenterology | Admitting: Gastroenterology

## 2018-03-11 ENCOUNTER — Encounter
Admission: RE | Disposition: A | Discharge status: Home / Self Care | Payer: Self-pay | Source: Ambulatory Visit | Attending: Gastroenterology

## 2018-03-11 ENCOUNTER — Ambulatory Visit: Payer: Medicare HMO | Admitting: Anesthesiology

## 2018-03-11 ENCOUNTER — Encounter: Payer: Self-pay | Admitting: *Deleted

## 2018-03-11 DIAGNOSIS — K5730 Diverticulosis of large intestine without perforation or abscess without bleeding: Secondary | ICD-10-CM | POA: Diagnosis not present

## 2018-03-11 DIAGNOSIS — Z886 Allergy status to analgesic agent status: Secondary | ICD-10-CM | POA: Diagnosis not present

## 2018-03-11 DIAGNOSIS — Z79899 Other long term (current) drug therapy: Secondary | ICD-10-CM | POA: Insufficient documentation

## 2018-03-11 DIAGNOSIS — Z888 Allergy status to other drugs, medicaments and biological substances status: Secondary | ICD-10-CM | POA: Diagnosis not present

## 2018-03-11 DIAGNOSIS — Z1211 Encounter for screening for malignant neoplasm of colon: Secondary | ICD-10-CM | POA: Diagnosis not present

## 2018-03-11 DIAGNOSIS — K219 Gastro-esophageal reflux disease without esophagitis: Secondary | ICD-10-CM | POA: Diagnosis not present

## 2018-03-11 DIAGNOSIS — E039 Hypothyroidism, unspecified: Secondary | ICD-10-CM | POA: Insufficient documentation

## 2018-03-11 DIAGNOSIS — K6289 Other specified diseases of anus and rectum: Secondary | ICD-10-CM | POA: Insufficient documentation

## 2018-03-11 DIAGNOSIS — Z7982 Long term (current) use of aspirin: Secondary | ICD-10-CM | POA: Diagnosis not present

## 2018-03-11 DIAGNOSIS — E785 Hyperlipidemia, unspecified: Secondary | ICD-10-CM | POA: Diagnosis not present

## 2018-03-11 DIAGNOSIS — Z881 Allergy status to other antibiotic agents status: Secondary | ICD-10-CM | POA: Insufficient documentation

## 2018-03-11 DIAGNOSIS — I10 Essential (primary) hypertension: Secondary | ICD-10-CM | POA: Diagnosis not present

## 2018-03-11 DIAGNOSIS — Z887 Allergy status to serum and vaccine status: Secondary | ICD-10-CM | POA: Insufficient documentation

## 2018-03-11 HISTORY — PX: COLONOSCOPY WITH PROPOFOL: SHX5780

## 2018-03-11 HISTORY — DX: Personal history of other infectious and parasitic diseases: Z86.19

## 2018-03-11 HISTORY — DX: Gastro-esophageal reflux disease without esophagitis: K21.9

## 2018-03-11 HISTORY — DX: Hyperlipidemia, unspecified: E78.5

## 2018-03-11 HISTORY — DX: Hypothyroidism, unspecified: E03.9

## 2018-03-11 SURGERY — COLONOSCOPY WITH PROPOFOL
Anesthesia: General

## 2018-03-11 MED ORDER — SODIUM CHLORIDE 0.9 % IV SOLN
INTRAVENOUS | Status: DC
  Administered 2018-03-11: 1000 mL via INTRAVENOUS

## 2018-03-11 MED ORDER — LIDOCAINE HCL (CARDIAC) PF 100 MG/5ML IV SOSY
PREFILLED_SYRINGE | INTRAVENOUS | Status: DC | PRN
  Administered 2018-03-11: 35 mg via INTRAVENOUS

## 2018-03-11 MED ORDER — PROPOFOL 10 MG/ML IV BOLUS
INTRAVENOUS | Status: AC
  Filled 2018-03-11: qty 40

## 2018-03-11 MED ORDER — PROPOFOL 10 MG/ML IV BOLUS
INTRAVENOUS | Status: DC | PRN
  Administered 2018-03-11: 20 mg via INTRAVENOUS
  Administered 2018-03-11 (×2): 50 mg via INTRAVENOUS

## 2018-03-11 MED ORDER — LIDOCAINE HCL (PF) 2 % IJ SOLN
INTRAMUSCULAR | Status: AC
  Filled 2018-03-11: qty 10

## 2018-03-11 MED ORDER — PROPOFOL 500 MG/50ML IV EMUL
INTRAVENOUS | Status: DC | PRN
  Administered 2018-03-11: 125 ug/kg/min via INTRAVENOUS

## 2018-03-11 MED ORDER — PROPOFOL 500 MG/50ML IV EMUL
INTRAVENOUS | Status: AC
  Filled 2018-03-11: qty 50

## 2018-03-11 NOTE — H&P (Signed)
Outpatient short stay form Pre-procedure 03/11/2018 2:34 PM Rebecca Sails MD  Primary Physician: Dr Dion Body  Reason for visit: Colon cancer screening  History of present illness: Patient is a 67 year old female presenting today as above.  She tolerated her prep well.  She takes no aspirin or blood thinning agent.  Patient did have a previous colonoscopy for this was apparently incomplete.    Current Facility-Administered Medications:  .  0.9 %  sodium chloride infusion, , Intravenous, Continuous, Rebecca Sails, MD, Last Rate: 20 mL/hr at 03/11/18 1314  Medications Prior to Admission  Medication Sig Dispense Refill Last Dose  . aspirin EC 81 MG tablet Take 81 mg by mouth daily.   Past Week at Unknown time  . cetirizine (ZYRTEC) 10 MG tablet Take 10 mg by mouth daily.   Past Month at Unknown time  . cyanocobalamin 1000 MCG tablet Take 1,000 mcg by mouth daily.   Past Week at Unknown time  . pantoprazole (PROTONIX) 40 MG tablet    03/10/2018 at Unknown time  . pravastatin (PRAVACHOL) 40 MG tablet Take 40 mg by mouth daily.   03/10/2018 at Unknown time  . sucralfate (CARAFATE) 1 GM/10ML suspension Take 10 mLs (1 g total) by mouth 4 (four) times daily -  with meals and at bedtime. 420 mL 0 03/10/2018 at Unknown time  . SYNTHROID 112 MCG tablet    03/10/2018 at Unknown time  . ezetimibe (ZETIA) 10 MG tablet    Not Taking at Unknown time  . fluticasone (FLONASE) 50 MCG/ACT nasal spray    Completed Course at Unknown time  . oxyCODONE-acetaminophen (ROXICET) 5-325 MG tablet Take 1 tablet by mouth every 4 (four) hours as needed for severe pain. (Patient not taking: Reported on 03/11/2018) 20 tablet 0 Completed Course at Unknown time     Allergies  Allergen Reactions  . Guaifenesin Shortness Of Breath  . Lisinopril Shortness Of Breath and Palpitations  . Tetanus Toxoids Anaphylaxis  . Ciprofloxacin     Pt had muscle stiffness and pain in her knee while taking. Pt was having  trouble walking.  . Fluticasone   . Imodium [Loperamide] Itching  . Lovastatin     intolerance  . Metronidazole     Pt had muscle stiffness and pain in her knee while taking. Pt was having trouble walking.   . Paroxetine Nausea Only  . Rosuvastatin Calcium Itching  . Motrin [Ibuprofen] Rash    Pt states, "It counteracts my Synthroid."     Past Medical History:  Diagnosis Date  . Disease of thyroid gland   . GERD (gastroesophageal reflux disease)   . History of Clostridium difficile colitis   . Hyperlipidemia   . Hypertension   . Hypothyroidism     Review of systems:      Physical Exam    Heart and lungs: Regular rate and rhythm without rub or gallop, lungs are bilaterally clear.    HEENT: Normocephalic atraumatic eyes are anicteric    Other:    Pertinant exam for procedure: Soft nontender nondistended bowel sounds positive normoactive    Planned proceedures: Colonoscopy and indicated procedures. I have discussed the risks benefits and complications of procedures to include not limited to bleeding, infection, perforation and the risk of sedation and the patient wishes to proceed.    Rebecca Sails, MD Gastroenterology 03/11/2018  2:34 PM

## 2018-03-11 NOTE — Anesthesia Post-op Follow-up Note (Signed)
Anesthesia QCDR form completed.        

## 2018-03-11 NOTE — Anesthesia Preprocedure Evaluation (Addendum)
Anesthesia Evaluation  Patient identified by MRN, date of birth, ID band Patient awake    Reviewed: Allergy & Precautions, H&P , NPO status , reviewed documented beta blocker date and time   Airway Mallampati: II  TM Distance: >3 FB Neck ROM: full    Dental  (+) Chipped, Missing, Upper Dentures   Pulmonary    Pulmonary exam normal        Cardiovascular hypertension, Normal cardiovascular exam     Neuro/Psych  Headaches,    GI/Hepatic GERD  ,  Endo/Other  Hypothyroidism   Renal/GU      Musculoskeletal   Abdominal   Peds  Hematology   Anesthesia Other Findings Past Medical History: No date: Disease of thyroid gland No date: GERD (gastroesophageal reflux disease) No date: History of Clostridium difficile colitis No date: Hyperlipidemia No date: Hypertension No date: Hypothyroidism  Past Surgical History: No date: ABDOMINAL HYSTERECTOMY No date: APPENDECTOMY No date: BREAST CYST EXCISION No date: CHOLECYSTECTOMY No date: COLONOSCOPY No date: Left Foot Surgery No date: Left Knee Surgery No date: THYROID SURGERY  BMI    Body Mass Index:  30.02 kg/m      Reproductive/Obstetrics                            Anesthesia Physical Anesthesia Plan  ASA: II  Anesthesia Plan: General   Post-op Pain Management:    Induction: Intravenous  PONV Risk Score and Plan: 3 and Treatment may vary due to age or medical condition and TIVA  Airway Management Planned: Nasal Cannula and Natural Airway  Additional Equipment:   Intra-op Plan:   Post-operative Plan:   Informed Consent: I have reviewed the patients History and Physical, chart, labs and discussed the procedure including the risks, benefits and alternatives for the proposed anesthesia with the patient or authorized representative who has indicated his/her understanding and acceptance.   Dental Advisory Given  Plan Discussed with:  CRNA  Anesthesia Plan Comments:         Anesthesia Quick Evaluation

## 2018-03-11 NOTE — Op Note (Signed)
[facility] Patient Name: Rebecca Conner Procedure Date: 03/11/2018 2:20 PM MRN: [id number] Account #: [id number] Date of Birth: 04/10/1951 Admit Type: Outpatient Age: 67 Room: [facility] ENDO ROOM 3 Gender: Female Note Status: Finalized Procedure:            Colonoscopy Indications:          Screening for colorectal malignant neoplasm Providers:            Lollie Sails, MD Referring MD:         Dion Body (Referring MD) Medicines:            Monitored Anesthesia Care Complications:        No immediate complications. Procedure:            Pre-Anesthesia Assessment:                       - ASA Grade Assessment: II - A patient with mild                        systemic disease.                       After obtaining informed consent, the colonoscope was                        passed under direct vision. Throughout the procedure,                        the patient's blood pressure, pulse, and oxygen                        saturations were monitored continuously. The                        Colonoscope was introduced through the anus and                        advanced to the the cecum, identified by appendiceal                        orifice and ileocecal valve. The quality of the bowel                        preparation was good. Findings:      The sigmoid colon, descending colon and transverse colon were       significantly redundant.      A patchy area of mildly erythematous mucosa was found in the distal       rectum. Biopsies for histology were taken with a cold forceps from the       left colon and rectum for evaluation of microscopic colitis.      Multiple small-mouthed diverticula were found in the sigmoid colon and       descending colon.      The digital rectal exam was normal. Impression:           - Redundant colon.                       - Erythematous mucosa in the distal rectum. Biopsied.                       -  Diverticulosis in  the sigmoid colon and in the                        descending colon. Recommendation:       - Soft diet today, then advance as tolerated to advance                        diet as tolerated. Procedure Code(s):    --- Professional ---                       [phone], Colonoscopy, flexible; with biopsy, single or                        multiple Diagnosis Code(s):    --- Professional ---                       Z12.11, Encounter for screening for malignant neoplasm                        of colon                       K62.89, Other specified diseases of anus and rectum                       K57.30, Diverticulosis of large intestine without                        perforation or abscess without bleeding                       Q43.8, Other specified congenital malformations of                        intestine CPT copyright 2017 American [employer]. All rights reserved. The codes documented in this report are preliminary and upon coder review may  be revised to meet current compliance requirements. Lollie Sails, MD 03/11/2018 3:23:33 PM This report has been signed electronically. Number of Addenda: 0 Note Initiated On: 03/11/2018 2:20 PM Scope Withdrawal Time: 0 hours 10 minutes 26 seconds  Total Procedure Duration: 0 hours 33 minutes 41 seconds       [facility]

## 2018-03-11 NOTE — Anesthesia Postprocedure Evaluation (Signed)
Anesthesia Post Note  Patient: Rebecca Conner  Procedure(s) Performed: COLONOSCOPY WITH PROPOFOL (N/A )  Patient location during evaluation: PACU Anesthesia Type: General Level of consciousness: awake and alert and oriented Pain management: pain level controlled Vital Signs Assessment: post-procedure vital signs reviewed and stable Respiratory status: spontaneous breathing Cardiovascular status: blood pressure returned to baseline Anesthetic complications: no     Last Vitals:  Vitals:   03/11/18 1524 03/11/18 1526  BP:  127/69  Pulse:  80  Resp:  17  Temp: (!) 36.4 C   SpO2:  100%    Last Pain:  Vitals:   03/11/18 1524  TempSrc: Tympanic                 [name]

## 2018-03-11 NOTE — Transfer of Care (Signed)
Immediate Anesthesia Transfer of Care Note  Patient: Rebecca Conner  Procedure(s) Performed: COLONOSCOPY WITH PROPOFOL (N/A )  Patient Location: PACU  Anesthesia Type:General  Level of Consciousness: awake and alert   Airway & Oxygen Therapy: Patient Spontanous Breathing and Patient connected to nasal cannula oxygen  Post-op Assessment: Report given to RN and Post -op Vital signs reviewed and stable  Post vital signs: Reviewed and stable  Last Vitals:  Vitals Value Taken Time  BP 127/69 03/11/2018  3:26 PM  Temp 36.4 C 03/11/2018  3:24 PM  Pulse 80 03/11/2018  3:26 PM  Resp 18 03/11/2018  3:27 PM  SpO2 100 % 03/11/2018  3:26 PM  Vitals shown include unvalidated device data.  Last Pain:  Vitals:   03/11/18 1524  TempSrc: Tympanic      Patients Stated Pain Goal: 0 (22/44/97 5300)  Complications: No apparent anesthesia complications

## 2018-03-12 ENCOUNTER — Encounter: Payer: Self-pay | Admitting: Gastroenterology

## 2018-03-13 LAB — SURGICAL PATHOLOGY

## 2018-05-01 ENCOUNTER — Encounter: Payer: Self-pay | Admitting: *Deleted

## 2018-05-02 ENCOUNTER — Encounter
Admission: RE | Disposition: A | Discharge status: Home / Self Care | Payer: Self-pay | Source: Ambulatory Visit | Attending: Ophthalmology

## 2018-05-02 ENCOUNTER — Encounter: Payer: Self-pay | Admitting: *Deleted

## 2018-05-02 ENCOUNTER — Ambulatory Visit: Payer: Medicare HMO | Admitting: Anesthesiology

## 2018-05-02 ENCOUNTER — Ambulatory Visit
Admission: RE | Admit: 2018-05-02 | Discharge: 2018-05-02 | Disposition: A | Discharge status: Home / Self Care | Payer: Medicare HMO | Source: Ambulatory Visit | Attending: Ophthalmology | Admitting: Ophthalmology

## 2018-05-02 ENCOUNTER — Other Ambulatory Visit: Payer: Self-pay

## 2018-05-02 DIAGNOSIS — Z887 Allergy status to serum and vaccine status: Secondary | ICD-10-CM | POA: Diagnosis not present

## 2018-05-02 DIAGNOSIS — K219 Gastro-esophageal reflux disease without esophagitis: Secondary | ICD-10-CM | POA: Insufficient documentation

## 2018-05-02 DIAGNOSIS — I10 Essential (primary) hypertension: Secondary | ICD-10-CM | POA: Diagnosis not present

## 2018-05-02 DIAGNOSIS — Z888 Allergy status to other drugs, medicaments and biological substances status: Secondary | ICD-10-CM | POA: Insufficient documentation

## 2018-05-02 DIAGNOSIS — M1990 Unspecified osteoarthritis, unspecified site: Secondary | ICD-10-CM | POA: Insufficient documentation

## 2018-05-02 DIAGNOSIS — Z79899 Other long term (current) drug therapy: Secondary | ICD-10-CM | POA: Insufficient documentation

## 2018-05-02 DIAGNOSIS — E7800 Pure hypercholesterolemia, unspecified: Secondary | ICD-10-CM | POA: Diagnosis not present

## 2018-05-02 DIAGNOSIS — E039 Hypothyroidism, unspecified: Secondary | ICD-10-CM | POA: Diagnosis not present

## 2018-05-02 DIAGNOSIS — H2511 Age-related nuclear cataract, right eye: Secondary | ICD-10-CM | POA: Diagnosis present

## 2018-05-02 DIAGNOSIS — Z87891 Personal history of nicotine dependence: Secondary | ICD-10-CM | POA: Insufficient documentation

## 2018-05-02 DIAGNOSIS — Z881 Allergy status to other antibiotic agents status: Secondary | ICD-10-CM | POA: Insufficient documentation

## 2018-05-02 HISTORY — DX: Other complications of anesthesia, initial encounter: T88.59XA

## 2018-05-02 HISTORY — PX: CATARACT EXTRACTION W/PHACO: SHX586

## 2018-05-02 HISTORY — DX: Other specified postprocedural states: R11.2

## 2018-05-02 HISTORY — DX: Bell's palsy: G51.0

## 2018-05-02 HISTORY — DX: Other specified postprocedural states: Z98.890

## 2018-05-02 HISTORY — DX: Adverse effect of unspecified anesthetic, initial encounter: T41.45XA

## 2018-05-02 SURGERY — PHACOEMULSIFICATION, CATARACT, WITH IOL INSERTION
Anesthesia: Monitor Anesthesia Care | Site: Eye | Laterality: Right

## 2018-05-02 MED ORDER — SODIUM CHLORIDE 0.9 % IV SOLN
INTRAVENOUS | Status: DC
  Administered 2018-05-02: 06:00:00 via INTRAVENOUS

## 2018-05-02 MED ORDER — NA CHONDROIT SULF-NA HYALURON 40-30 MG/ML IO SOLN
INTRAOCULAR | Status: DC | PRN
  Administered 2018-05-02: .5 mL via INTRAOCULAR

## 2018-05-02 MED ORDER — POLYMYXIN B-TRIMETHOPRIM 10000-0.1 UNIT/ML-% OP SOLN
OPHTHALMIC | Status: DC | PRN
  Administered 2018-05-02: 1 [drp] via OPHTHALMIC

## 2018-05-02 MED ORDER — ARMC OPHTHALMIC DILATING DROPS
1.0000 "application " | OPHTHALMIC | Status: AC
  Administered 2018-05-02 (×3): 1 via OPHTHALMIC

## 2018-05-02 MED ORDER — EPINEPHRINE PF 1 MG/ML IJ SOLN
INTRAOCULAR | Status: DC | PRN
  Administered 2018-05-02: 1 mL via OPHTHALMIC

## 2018-05-02 MED ORDER — POVIDONE-IODINE 5 % OP SOLN
OPHTHALMIC | Status: DC | PRN
  Administered 2018-05-02: 1 via OPHTHALMIC

## 2018-05-02 MED ORDER — MANNITOL 25 % IV SOLN
12.5000 g | Freq: Once | INTRAVENOUS | Status: AC
  Administered 2018-05-02: 12.5 g via INTRAVENOUS
  Filled 2018-05-02: qty 50

## 2018-05-02 MED ORDER — NA HYALUR & NA CHOND-NA HYALUR 0.55-0.5 ML IO KIT
PACK | INTRAOCULAR | Status: DC | PRN
  Administered 2018-05-02: 1 via OPHTHALMIC

## 2018-05-02 MED ORDER — POLYMYXIN B-TRIMETHOPRIM 10000-0.1 UNIT/ML-% OP SOLN: 1.0000 [drp] | OPHTHALMIC | Status: DC

## 2018-05-02 MED ORDER — CEFUROXIME OPHTHALMIC INJECTION 1 MG/0.1 ML
INJECTION | OPHTHALMIC | Status: DC | PRN
  Administered 2018-05-02: 1 mg via INTRACAMERAL

## 2018-05-02 MED ORDER — LIDOCAINE HCL (PF) 4 % IJ SOLN
INTRAMUSCULAR | Status: AC
  Filled 2018-05-02: qty 5

## 2018-05-02 MED ORDER — ARMC OPHTHALMIC DILATING DROPS
OPHTHALMIC | Status: AC
  Administered 2018-05-02: 1 via OPHTHALMIC
  Filled 2018-05-02: qty 0.5

## 2018-05-02 MED ORDER — MIDAZOLAM HCL 2 MG/2ML IJ SOLN
INTRAMUSCULAR | Status: DC | PRN
  Administered 2018-05-02 (×4): 0.5 mg via INTRAVENOUS

## 2018-05-02 MED ORDER — NA CHONDROIT SULF-NA HYALURON 40-30 MG/ML IO SOLN
INTRAOCULAR | Status: AC
  Filled 2018-05-02: qty 0.5

## 2018-05-02 MED ORDER — POLYMYXIN B-TRIMETHOPRIM 10000-0.1 UNIT/ML-% OP SOLN
OPHTHALMIC | Status: AC
  Filled 2018-05-02: qty 10

## 2018-05-02 MED ORDER — NA HYALUR & NA CHOND-NA HYALUR 0.55-0.5 ML IO KIT
PACK | INTRAOCULAR | Status: AC
  Filled 2018-05-02: qty 2.1

## 2018-05-02 MED ORDER — TETRACAINE HCL 0.5 % OP SOLN
OPHTHALMIC | Status: AC
  Administered 2018-05-02: 1 [drp] via OPHTHALMIC
  Filled 2018-05-02: qty 4

## 2018-05-02 MED ORDER — EPINEPHRINE PF 1 MG/ML IJ SOLN
INTRAMUSCULAR | Status: AC
  Filled 2018-05-02: qty 1

## 2018-05-02 MED ORDER — TETRACAINE HCL 0.5 % OP SOLN
1.0000 [drp] | OPHTHALMIC | Status: AC | PRN
  Administered 2018-05-02 (×3): 1 [drp] via OPHTHALMIC

## 2018-05-02 MED ORDER — SODIUM HYALURONATE 23 MG/ML IO SOLN
INTRAOCULAR | Status: AC
  Filled 2018-05-02: qty 0.6

## 2018-05-02 MED ORDER — MOXIFLOXACIN HCL 0.5 % OP SOLN: 1.0000 [drp] | OPHTHALMIC | Status: DC | PRN

## 2018-05-02 MED ORDER — MIDAZOLAM HCL 2 MG/2ML IJ SOLN
INTRAMUSCULAR | Status: AC
  Filled 2018-05-02: qty 2

## 2018-05-02 MED ORDER — POVIDONE-IODINE 5 % OP SOLN
OPHTHALMIC | Status: AC
  Filled 2018-05-02: qty 30

## 2018-05-02 MED ORDER — TRYPAN BLUE 0.06 % OP SOLN
OPHTHALMIC | Status: AC
  Filled 2018-05-02: qty 0.5

## 2018-05-02 MED ORDER — CARBACHOL 0.01 % IO SOLN
INTRAOCULAR | Status: DC | PRN
  Administered 2018-05-02: 0.5 mL via INTRAOCULAR

## 2018-05-02 MED ORDER — LIDOCAINE HCL (PF) 4 % IJ SOLN
INTRAOCULAR | Status: DC | PRN
  Administered 2018-05-02: 2 mL via OPHTHALMIC

## 2018-05-02 MED ORDER — TRYPAN BLUE 0.06 % OP SOLN
OPHTHALMIC | Status: DC | PRN
  Administered 2018-05-02: .5 mL via INTRAOCULAR

## 2018-05-02 MED ORDER — MOXIFLOXACIN HCL 0.5 % OP SOLN
OPHTHALMIC | Status: AC
  Filled 2018-05-02: qty 3

## 2018-05-02 SURGICAL SUPPLY — 16 items
DISSECTOR HYDRO NUCLEUS 50X22 (MISCELLANEOUS) ×2 IMPLANT
GLOVE BIOGEL M 6.5 STRL (GLOVE) ×2 IMPLANT
GOWN STRL REUS W/ TWL LRG LVL3 (GOWN DISPOSABLE) ×2 IMPLANT
GOWN STRL REUS W/TWL LRG LVL3 (GOWN DISPOSABLE) ×2
KNIFE 45D UP 2.3 (MISCELLANEOUS) ×2 IMPLANT
LABEL CATARACT MEDS ST (LABEL) ×2 IMPLANT
LENS IOL ACRYSOF IQ 22.5 (Intraocular Lens) ×2 IMPLANT
MARKER SKIN DUAL TIP RULER LAB (MISCELLANEOUS) ×2 IMPLANT
PACK CATARACT (MISCELLANEOUS) ×2 IMPLANT
PACK CATARACT KING (MISCELLANEOUS) ×2 IMPLANT
PACK EYE AFTER SURG (MISCELLANEOUS) ×2 IMPLANT
SOL BSS BAG (MISCELLANEOUS) ×2
SOLUTION BSS BAG (MISCELLANEOUS) ×1 IMPLANT
SYR 5ML LL (SYRINGE) ×2 IMPLANT
WATER STERILE IRR 250ML POUR (IV SOLUTION) ×2 IMPLANT
WIPE NON LINTING 3.25X3.25 (MISCELLANEOUS) ×2 IMPLANT

## 2018-05-02 NOTE — Anesthesia Preprocedure Evaluation (Signed)
Anesthesia Evaluation  Patient identified by MRN, date of birth, ID band Patient awake    Reviewed: Allergy & Precautions, NPO status , Patient's Chart, lab work & pertinent test results  History of Anesthesia Complications (+) PONV and history of anesthetic complications  Airway Mallampati: II  TM Distance: >3 FB Neck ROM: Full    Dental  (+) Upper Dentures, Missing   Pulmonary neg pulmonary ROS, neg sleep apnea, neg COPD,    breath sounds clear to auscultation- rhonchi (-) wheezing      Cardiovascular Exercise Tolerance: Good hypertension, Pt. on medications (-) CAD, (-) Past MI, (-) Cardiac Stents and (-) CABG  Rhythm:Regular Rate:Normal - Systolic murmurs and - Diastolic murmurs    Neuro/Psych  Headaches, negative psych ROS   GI/Hepatic Neg liver ROS, GERD  ,  Endo/Other  neg diabetesHypothyroidism   Renal/GU negative Renal ROS     Musculoskeletal negative musculoskeletal ROS (+)   Abdominal (+) - obese,   Peds  Hematology negative hematology ROS (+)   Anesthesia Other Findings Past Medical History: No date: Bell's palsy     Comment:  H/O No date: Complication of anesthesia No date: Disease of thyroid gland No date: GERD (gastroesophageal reflux disease) No date: History of Clostridium difficile colitis No date: Hyperlipidemia No date: Hypertension     Comment:  NO MEDS No date: Hypothyroidism No date: PONV (postoperative nausea and vomiting)   Reproductive/Obstetrics                             Anesthesia Physical Anesthesia Plan  ASA: II  Anesthesia Plan: MAC   Post-op Pain Management:    Induction: Intravenous  PONV Risk Score and Plan: 3 and Midazolam  Airway Management Planned: Natural Airway  Additional Equipment:   Intra-op Plan:   Post-operative Plan:   Informed Consent: I have reviewed the patients History and Physical, chart, labs and discussed the  procedure including the risks, benefits and alternatives for the proposed anesthesia with the patient or authorized representative who has indicated his/her understanding and acceptance.     Plan Discussed with: CRNA and Anesthesiologist  Anesthesia Plan Comments:         Anesthesia Quick Evaluation

## 2018-05-02 NOTE — Discharge Instructions (Addendum)
Eye Surgery Discharge Instructions  Expect mild scratchy sensation or mild soreness. DO NOT RUB YOUR EYE!  The day of surgery:  Minimal physical activity, but bed rest is not required  No reading, computer work, or close hand work  No bending, lifting, or straining.  May watch TV  For 24 hours:  No driving, legal decisions, or alcoholic beverages  Safety precautions  Eat anything you prefer: It is better to start with liquids, then soup then solid foods.  Solar shield eyeglasses should be worn for comfort in the sunlight/patch while sleeping       Eye Patch to be removed in 2 hours and start drops. Dr Neville Route wants drops to be administered 6 times today. Approx. Every 2 hours. Resume all regular medications including aspirin or Coumadin if these were discontinued prior to surgery. You may shower, bathe, shave, or wash your hair. Tylenol may be taken for mild discomfort. FOLLOW EYE DROP INSTRUCTION SHEET AS REVIEWED. Omni drop instruction sheet given to pt per Dr Neville Route instruction.  Call your doctor if you experience significant pain, nausea, or vomiting, fever > 101 or other signs of infection. [phone] or [phone] Specific instructions:  Follow-up Information    Marchia Meiers, MD Follow up.   Specialty:  Ophthalmology Why:  05/03/18 @ 9:30 am Contact information: [place] [address] [phone]

## 2018-05-02 NOTE — Transfer of Care (Signed)
Immediate Anesthesia Transfer of Care Note  Patient: Rebecca Conner  Procedure(s) Performed: CATARACT EXTRACTION PHACO AND INTRAOCULAR LENS PLACEMENT (IOC) (Right Eye)  Patient Location: Short Stay  Anesthesia Type:MAC  Level of Consciousness: awake, alert , oriented and patient cooperative  Airway & Oxygen Therapy: Patient Spontanous Breathing  Post-op Assessment: Report given to RN and Post -op Vital signs reviewed and stable  Post vital signs: Reviewed and stable  Last Vitals:  Vitals Value Taken Time  BP    Temp    Pulse    Resp    SpO2      Last Pain:  Vitals:   05/02/18 0609  TempSrc: Tympanic  PainSc: 0-No pain         Complications: No apparent anesthesia complications

## 2018-05-02 NOTE — H&P (Signed)
  The patient has been examined, and there have been no changes to her H&P.  Rebecca Meiers MD

## 2018-05-02 NOTE — Anesthesia Post-op Follow-up Note (Signed)
Anesthesia QCDR form completed.        

## 2018-05-02 NOTE — Addendum Note (Signed)
Addendum  created 05/02/18 0910 by Eben Burow, CRNA   Intraprocedure Flowsheets edited

## 2018-05-02 NOTE — Anesthesia Postprocedure Evaluation (Signed)
Anesthesia Post Note  Patient: Naylea Wigington  Procedure(s) Performed: CATARACT EXTRACTION PHACO AND INTRAOCULAR LENS PLACEMENT ([place]) (Right Eye)  Patient location during evaluation: Short Stay Anesthesia Type: MAC Level of consciousness: oriented, patient cooperative and awake and alert Pain management: satisfactory to patient Vital Signs Assessment: post-procedure vital signs reviewed and stable Respiratory status: spontaneous breathing, respiratory function stable and nonlabored ventilation Cardiovascular status: blood pressure returned to baseline and stable Postop Assessment: no headache and no apparent nausea or vomiting Anesthetic complications: no     Last Vitals:  Vitals:   05/02/18 0609 05/02/18 0849  BP: (!) 141/81 (!) 142/86  Pulse: 67 70  Resp: 18 18  Temp: 36.6 C 36.4 C  SpO2: 96% 100%    Last Pain:  Vitals:   05/02/18 0849  TempSrc:   PainSc: 0-No pain                 Eben Burow

## 2018-05-02 NOTE — Op Note (Signed)
  PREOPERATIVE DIAGNOSIS:  Nuclear sclerotic cataract of the right eye.   POSTOPERATIVE DIAGNOSIS: nuclear sclerotic cataract right eye   OPERATIVE PROCEDURE:  Procedure(s): CATARACT EXTRACTION PHACO AND INTRAOCULAR LENS PLACEMENT (IOC)   SURGEON:  Marchia Meiers, MD.   ANESTHESIA:  Anesthesiologist: Emmie Niemann, MD CRNA: Eben Burow, CRNA  1.      Managed anesthesia care. 2.      Topical tetracaine drops   COMPLICATIONS:  None.   TECHNIQUE:   Divide and conquer   DESCRIPTION OF PROCEDURE:  The patient was examined and consented in the preoperative holding area where the aforementioned topical anesthesia was applied to the right eye and then brought back to the Operating Room where the right eye was prepped and draped in the usual sterile ophthalmic fashion and a lid speculum was placed. A paracentesis was created with the side port blade, trypan blue was injected into the anterior chamber, and the anterior chamber was filled with viscoelastic. A near clear corneal incision was performed with the steel keratome. A continuous curvilinear capsulorrhexis was performed with a cystotome followed by the capsulorrhexis forceps. Hydrodissection was carried out with BSS on a blunt cannula. The lens was removed in a stop and chop  technique and the remaining cortical material was removed with the irrigation-aspiration handpiece. The capsular bag was inflated with viscoelastic and the lens  was placed in the capsular bag without complication. The remaining viscoelastic was removed from the eye with the irrigation-aspiration handpiece. The wounds were hydrated. The anterior chamber was flushed with Miostat and the eye was inflated to physiologic pressure. 0.1 mL of cefuroxime concentration 10 mg/mL was placed in the anterior chamber. The wounds were found to be water tight. The eye was dressed with Polytrim. The patient was given protective glasses to wear throughout the day and a shield with which to  sleep tonight. The patient was also given drops with which to begin a drop regimen today and will follow-up with me in one day. Implant Name Type Inv. Item Serial No. Manufacturer Lot No. LRB No. Used  LENS IOL ACRYSOF IQ 22.5 - [id number] Intraocular Lens LENS IOL ACRYSOF IQ 22.5 [id number] ALCON  Right 1   Procedure(s) with comments: CATARACT EXTRACTION PHACO AND INTRAOCULAR LENS PLACEMENT (IOC) (Right) - [place]  00:40 AP% CDE 7.17 Fluid pack lot # [id number]  Electronically signed: Marchia Meiers 05/02/2018 8:49 AM

## 2018-06-04 ENCOUNTER — Encounter: Payer: Self-pay | Admitting: *Deleted

## 2018-06-04 NOTE — Pre-Procedure Instructions (Signed)
SPOKE WIT CINDY ,RN AT EYE CENTER TO VERIFY ORDERS AS STATED AND ALLERGIES NOTED.

## 2018-06-06 ENCOUNTER — Other Ambulatory Visit: Payer: Self-pay

## 2018-06-06 ENCOUNTER — Encounter: Payer: Self-pay | Admitting: *Deleted

## 2018-06-06 ENCOUNTER — Ambulatory Visit
Admission: RE | Admit: 2018-06-06 | Discharge: 2018-06-06 | Disposition: A | Discharge status: Home / Self Care | Payer: Medicare HMO | Source: Ambulatory Visit | Attending: Ophthalmology | Admitting: Ophthalmology

## 2018-06-06 ENCOUNTER — Ambulatory Visit: Payer: Medicare HMO | Admitting: Anesthesiology

## 2018-06-06 ENCOUNTER — Encounter
Admission: RE | Disposition: A | Discharge status: Home / Self Care | Payer: Self-pay | Source: Ambulatory Visit | Attending: Ophthalmology

## 2018-06-06 DIAGNOSIS — K219 Gastro-esophageal reflux disease without esophagitis: Secondary | ICD-10-CM | POA: Diagnosis not present

## 2018-06-06 DIAGNOSIS — M1990 Unspecified osteoarthritis, unspecified site: Secondary | ICD-10-CM | POA: Insufficient documentation

## 2018-06-06 DIAGNOSIS — H2512 Age-related nuclear cataract, left eye: Secondary | ICD-10-CM | POA: Insufficient documentation

## 2018-06-06 DIAGNOSIS — Z888 Allergy status to other drugs, medicaments and biological substances status: Secondary | ICD-10-CM | POA: Diagnosis not present

## 2018-06-06 DIAGNOSIS — Z887 Allergy status to serum and vaccine status: Secondary | ICD-10-CM | POA: Insufficient documentation

## 2018-06-06 DIAGNOSIS — E039 Hypothyroidism, unspecified: Secondary | ICD-10-CM | POA: Diagnosis not present

## 2018-06-06 DIAGNOSIS — Z79890 Hormone replacement therapy (postmenopausal): Secondary | ICD-10-CM | POA: Insufficient documentation

## 2018-06-06 DIAGNOSIS — Z87891 Personal history of nicotine dependence: Secondary | ICD-10-CM | POA: Diagnosis not present

## 2018-06-06 DIAGNOSIS — Z79899 Other long term (current) drug therapy: Secondary | ICD-10-CM | POA: Diagnosis not present

## 2018-06-06 DIAGNOSIS — Z9841 Cataract extraction status, right eye: Secondary | ICD-10-CM | POA: Insufficient documentation

## 2018-06-06 DIAGNOSIS — I10 Essential (primary) hypertension: Secondary | ICD-10-CM | POA: Diagnosis not present

## 2018-06-06 DIAGNOSIS — E785 Hyperlipidemia, unspecified: Secondary | ICD-10-CM | POA: Diagnosis not present

## 2018-06-06 HISTORY — DX: Unspecified osteoarthritis, unspecified site: M19.90

## 2018-06-06 HISTORY — PX: CATARACT EXTRACTION W/PHACO: SHX586

## 2018-06-06 SURGERY — PHACOEMULSIFICATION, CATARACT, WITH IOL INSERTION
Anesthesia: Monitor Anesthesia Care | Site: Eye | Laterality: Left

## 2018-06-06 MED ORDER — TRYPAN BLUE 0.06 % OP SOLN
OPHTHALMIC | Status: AC
  Filled 2018-06-06: qty 0.5

## 2018-06-06 MED ORDER — TETRACAINE HCL 0.5 % OP SOLN
1.0000 [drp] | OPHTHALMIC | Status: AC | PRN
  Administered 2018-06-06 (×3): 1 [drp] via OPHTHALMIC

## 2018-06-06 MED ORDER — ARMC OPHTHALMIC DILATING DROPS
OPHTHALMIC | Status: AC
  Administered 2018-06-06: 1 via OPHTHALMIC
  Filled 2018-06-06: qty 0.5

## 2018-06-06 MED ORDER — EPINEPHRINE PF 1 MG/ML IJ SOLN
INTRAOCULAR | Status: DC | PRN
  Administered 2018-06-06: 09:00:00 via OPHTHALMIC

## 2018-06-06 MED ORDER — POVIDONE-IODINE 5 % OP SOLN
OPHTHALMIC | Status: DC | PRN
  Administered 2018-06-06 (×2): 1 via OPHTHALMIC

## 2018-06-06 MED ORDER — MOXIFLOXACIN HCL 0.5 % OP SOLN
OPHTHALMIC | Status: AC
  Filled 2018-06-06: qty 3

## 2018-06-06 MED ORDER — NA HYALUR & NA CHOND-NA HYALUR 0.55-0.5 ML IO KIT
PACK | INTRAOCULAR | Status: DC | PRN
  Administered 2018-06-06: 1 via OPHTHALMIC

## 2018-06-06 MED ORDER — NA HYALUR & NA CHOND-NA HYALUR 0.55-0.5 ML IO KIT
PACK | INTRAOCULAR | Status: AC
  Filled 2018-06-06: qty 1.05

## 2018-06-06 MED ORDER — MOXIFLOXACIN HCL 0.5 % OP SOLN
OPHTHALMIC | Status: DC | PRN
  Administered 2018-06-06: 0.2 mL via OPHTHALMIC

## 2018-06-06 MED ORDER — ARMC OPHTHALMIC DILATING DROPS
1.0000 "application " | OPHTHALMIC | Status: AC
  Administered 2018-06-06 (×3): 1 via OPHTHALMIC

## 2018-06-06 MED ORDER — MIDAZOLAM HCL 2 MG/2ML IJ SOLN
INTRAMUSCULAR | Status: DC | PRN
  Administered 2018-06-06: 2 mg via INTRAVENOUS

## 2018-06-06 MED ORDER — FENTANYL CITRATE (PF) 100 MCG/2ML IJ SOLN
INTRAMUSCULAR | Status: AC
  Filled 2018-06-06: qty 2

## 2018-06-06 MED ORDER — LIDOCAINE HCL (PF) 4 % IJ SOLN
INTRAMUSCULAR | Status: AC
  Filled 2018-06-06: qty 5

## 2018-06-06 MED ORDER — FENTANYL CITRATE (PF) 100 MCG/2ML IJ SOLN
INTRAMUSCULAR | Status: DC | PRN
  Administered 2018-06-06: 25 ug via INTRAVENOUS

## 2018-06-06 MED ORDER — TRYPAN BLUE 0.06 % OP SOLN
OPHTHALMIC | Status: DC | PRN
  Administered 2018-06-06: 0.5 mL via INTRAOCULAR

## 2018-06-06 MED ORDER — MIDAZOLAM HCL 2 MG/2ML IJ SOLN
INTRAMUSCULAR | Status: AC
  Filled 2018-06-06: qty 2

## 2018-06-06 MED ORDER — SODIUM CHLORIDE 0.9 % IV SOLN
INTRAVENOUS | Status: DC
  Administered 2018-06-06: 07:00:00 via INTRAVENOUS

## 2018-06-06 MED ORDER — TETRACAINE HCL 0.5 % OP SOLN
OPHTHALMIC | Status: AC
  Administered 2018-06-06: 1 [drp] via OPHTHALMIC
  Filled 2018-06-06: qty 4

## 2018-06-06 MED ORDER — LIDOCAINE HCL (PF) 4 % IJ SOLN
INTRAOCULAR | Status: DC | PRN
  Administered 2018-06-06: 4 mL via OPHTHALMIC

## 2018-06-06 MED ORDER — POVIDONE-IODINE 5 % OP SOLN
OPHTHALMIC | Status: AC
  Filled 2018-06-06: qty 60

## 2018-06-06 MED ORDER — EPINEPHRINE PF 1 MG/ML IJ SOLN
INTRAMUSCULAR | Status: AC
  Filled 2018-06-06: qty 2

## 2018-06-06 MED ORDER — MOXIFLOXACIN HCL 0.5 % OP SOLN: 1.0000 [drp] | OPHTHALMIC | Status: DC | PRN

## 2018-06-06 SURGICAL SUPPLY — 18 items
DISSECTOR HYDRO NUCLEUS 50X22 (MISCELLANEOUS) ×8 IMPLANT
GLOVE BIOGEL M 6.5 STRL (GLOVE) ×2 IMPLANT
GOWN STRL REUS W/ TWL LRG LVL3 (GOWN DISPOSABLE) ×1 IMPLANT
GOWN STRL REUS W/ TWL XL LVL3 (GOWN DISPOSABLE) ×1 IMPLANT
GOWN STRL REUS W/TWL LRG LVL3 (GOWN DISPOSABLE) ×1
GOWN STRL REUS W/TWL XL LVL3 (GOWN DISPOSABLE) ×1
KNIFE 45D UP 2.3 (MISCELLANEOUS) ×2 IMPLANT
LABEL CATARACT MEDS ST (LABEL) ×2 IMPLANT
LENS IOL ACRSF IQ ULTRA 23.5 (Intraocular Lens) IMPLANT
LENS IOL ACRYSOF IQ 23.5 (Intraocular Lens) ×2 IMPLANT
PACK CATARACT (MISCELLANEOUS) ×2 IMPLANT
PACK CATARACT KING (MISCELLANEOUS) ×2 IMPLANT
PACK EYE AFTER SURG (MISCELLANEOUS) ×2 IMPLANT
SOL BSS BAG (MISCELLANEOUS) ×2
SOLUTION BSS BAG (MISCELLANEOUS) ×1 IMPLANT
SYR 5ML LL (SYRINGE) ×2 IMPLANT
WATER STERILE IRR 250ML POUR (IV SOLUTION) ×2 IMPLANT
WIPE NON LINTING 3.25X3.25 (MISCELLANEOUS) ×2 IMPLANT

## 2018-06-06 NOTE — Anesthesia Preprocedure Evaluation (Signed)
Anesthesia Evaluation  Patient identified by MRN, date of birth, ID band Patient awake    Reviewed: Allergy & Precautions, NPO status , Patient's Chart, lab work & pertinent test results  History of Anesthesia Complications (+) PONV and history of anesthetic complications  Airway Mallampati: II  TM Distance: >3 FB Neck ROM: Full    Dental  (+) Upper Dentures, Missing   Pulmonary neg pulmonary ROS, neg sleep apnea, neg COPD, former smoker,    breath sounds clear to auscultation- rhonchi (-) wheezing      Cardiovascular Exercise Tolerance: Good hypertension, Pt. on medications (-) CAD, (-) Past MI, (-) Cardiac Stents and (-) CABG  Rhythm:Regular Rate:Normal - Systolic murmurs and - Diastolic murmurs    Neuro/Psych  Headaches, negative psych ROS   GI/Hepatic Neg liver ROS, GERD  ,  Endo/Other  neg diabetesHypothyroidism   Renal/GU negative Renal ROS     Musculoskeletal negative musculoskeletal ROS (+)   Abdominal (+) - obese,   Peds  Hematology negative hematology ROS (+)   Anesthesia Other Findings Past Medical History: No date: Bell's palsy     Comment:  H/O No date: Complication of anesthesia No date: Disease of thyroid gland No date: GERD (gastroesophageal reflux disease) No date: History of Clostridium difficile colitis No date: Hyperlipidemia No date: Hypertension     Comment:  NO MEDS No date: Hypothyroidism No date: PONV (postoperative nausea and vomiting)   Reproductive/Obstetrics                             Anesthesia Physical  Anesthesia Plan  ASA: II  Anesthesia Plan: MAC   Post-op Pain Management:    Induction: Intravenous  PONV Risk Score and Plan: 3 and Midazolam  Airway Management Planned: Natural Airway  Additional Equipment:   Intra-op Plan:   Post-operative Plan:   Informed Consent: I have reviewed the patients History and Physical, chart, labs and  discussed the procedure including the risks, benefits and alternatives for the proposed anesthesia with the patient or authorized representative who has indicated his/her understanding and acceptance.     Plan Discussed with: CRNA and Anesthesiologist  Anesthesia Plan Comments:         Anesthesia Quick Evaluation

## 2018-06-06 NOTE — Anesthesia Postprocedure Evaluation (Signed)
Anesthesia Post Note  Patient: Emelina Hinch  Procedure(s) Performed: CATARACT EXTRACTION PHACO AND INTRAOCULAR LENS PLACEMENT ([place]) (Left Eye)  Patient location during evaluation: PACU Anesthesia Type: MAC Level of consciousness: awake and alert and oriented Pain management: pain level controlled Vital Signs Assessment: post-procedure vital signs reviewed and stable Respiratory status: spontaneous breathing, nonlabored ventilation and respiratory function stable Cardiovascular status: blood pressure returned to baseline and stable Postop Assessment: no signs of nausea or vomiting Anesthetic complications: no     Last Vitals:  Vitals:   06/06/18 0919 06/06/18 0930  BP: (!) 143/77 (!) 146/86  Pulse: 89 86  Resp: (!) 6   Temp: (!) 36.1 C   SpO2: 100% 99%    Last Pain:  Vitals:   06/06/18 0930  TempSrc:   PainSc: 0-No pain                 Vernona Peake

## 2018-06-06 NOTE — Discharge Instructions (Signed)
Eye Surgery Discharge Instructions    Expect mild scratchy sensation or mild soreness. DO NOT RUB YOUR EYE!  The day of surgery:  Minimal physical activity, but bed rest is not required  No reading, computer work, or close hand work  No bending, lifting, or straining.  May watch TV  For 24 hours:  No driving, legal decisions, or alcoholic beverages  Safety precautions  Eat anything you prefer: It is better to start with liquids, then soup then solid foods.  _____ Eye patch should be worn until postoperative exam tomorrow.  ____ Solar shield eyeglasses should be worn for comfort in the sunlight/patch while sleeping  Resume all regular medications including aspirin or Coumadin if these were discontinued prior to surgery. You may shower, bathe, shave, or wash your hair. Tylenol may be taken for mild discomfort.  Call your doctor if you experience significant pain, nausea, or vomiting, fever > 101 or other signs of infection. [phone] or [phone] Specific instructions:  Follow-up Information    Marchia Meiers, MD Follow up.   Specialty:  Ophthalmology Why:  06/07/18 at [address] information: [address] [place] [address] [phone]          Eye Surgery Discharge Instructions    Expect mild scratchy sensation or mild soreness. DO NOT RUB YOUR EYE!  The day of surgery:  Minimal physical activity, but bed rest is not required  No reading, computer work, or close hand work  No bending, lifting, or straining.  May watch TV  For 24 hours:  No driving, legal decisions, or alcoholic beverages  Safety precautions  Eat anything you prefer: It is better to start with liquids, then soup then solid foods.  _____ Eye patch should be worn until postoperative exam tomorrow.  ____ Solar shield eyeglasses should be worn for comfort in the sunlight/patch while sleeping  Resume all regular medications including aspirin or Coumadin if these  were discontinued prior to surgery. You may shower, bathe, shave, or wash your hair. Tylenol may be taken for mild discomfort.  Call your doctor if you experience significant pain, nausea, or vomiting, fever > 101 or other signs of infection. [phone] or [phone] Specific instructions:  Follow-up Information    Marchia Meiers, MD Follow up.   Specialty:  Ophthalmology Why:  06/07/18 at [address] information: [place]  [address] [phone]

## 2018-06-06 NOTE — Anesthesia Post-op Follow-up Note (Signed)
Anesthesia QCDR form completed.        

## 2018-06-06 NOTE — H&P (Signed)
  I have reviewed the H&P and agree with its findings.  Marchia Meiers MD

## 2018-06-06 NOTE — Transfer of Care (Signed)
Immediate Anesthesia Transfer of Care Note  Patient: Rebecca Conner  Procedure(s) Performed: CATARACT EXTRACTION PHACO AND INTRAOCULAR LENS PLACEMENT ([place]) (Left Eye)  Patient Location: Short Stay  Anesthesia Type:MAC  Level of Consciousness: awake, alert  and oriented  Airway & Oxygen Therapy: Patient Spontanous Breathing  Post-op Assessment: Post -op Vital signs reviewed and stable  Post vital signs: stable  Last Vitals:  Vitals Value Taken Time  BP 143/77 06/06/2018  9:19 AM  Temp 36.1 C 06/06/2018  9:19 AM  Pulse 89 06/06/2018  9:19 AM  Resp 6 06/06/2018  9:19 AM  SpO2 100 % 06/06/2018  9:19 AM    Last Pain:  Vitals:   06/06/18 0919  TempSrc: Temporal  PainSc:          Complications: No apparent anesthesia complications

## 2018-06-06 NOTE — Op Note (Signed)
  PREOPERATIVE DIAGNOSIS:  Nuclear sclerotic cataract of the left eye.   POSTOPERATIVE DIAGNOSIS:  Nuclear sclerotic cataract of the left eye.   OPERATIVE PROCEDURE: Cataract surgery OS   SURGEON:  Marchia Meiers, MD.   ANESTHESIA:  Anesthesiologist: Emmie Niemann, MD CRNA: Aline Brochure, CRNA  1.      Managed anesthesia care. 2.     0.61ml of Shugarcaine was instilled following the paracentesis   COMPLICATIONS:  None.   TECHNIQUE:   Divide and conquer   DESCRIPTION OF PROCEDURE:  The patient was examined and consented in the preoperative holding area where the aforementioned topical anesthesia was applied to the left eye and then brought back to the Operating Room where the left eye was prepped and draped in the usual sterile ophthalmic fashion and a lid speculum was placed. A paracentesis was created with the side port blade, the anterior chamber was washed out with trypan blue to stain the anterior capsule, and the anterior chamber was filled with viscoelastic. A near clear corneal incision was performed with the steel keratome. A continuous curvilinear capsulorrhexis was performed with a cystotome followed by the capsulorrhexis forceps. Hydrodissection and hydrodelineation were carried out with BSS on a blunt cannula. The lens was removed in a divide and conquer  technique and the remaining cortical material was removed with the irrigation-aspiration handpiece. The capsular bag was inflated with viscoelastic and the lens was placed in the capsular bag without complication. The remaining viscoelastic was removed from the eye with the irrigation-aspiration handpiece. The wounds were hydrated. The anterior chamber was flushed and the eye was inflated to physiologic pressure. 0.67ml Vigamox was placed in the anterior chamber. The wounds were found to be water tight. The eye was dressed with Vigamox. The patient was given protective glasses to wear throughout the day and a shield with which to  sleep tonight. The patient was also given drops with which to begin a drop regimen today and will follow-up with me in one day. Implant Name Type Inv. Item Serial No. Manufacturer Lot No. LRB No. Used  LENS IOL ACRYSOF IQ 23.5 - [id number] Intraocular Lens LENS IOL ACRYSOF IQ 23.5 [id number] [employer]  Left 1    Procedure(s) with comments: CATARACT EXTRACTION PHACO AND INTRAOCULAR LENS PLACEMENT ([facility]) (Left) - [place] 00:34.0 CDE 6.36 FLUID PACK LOT # [id number]  Electronically signed: Marchia Meiers 06/06/2018 9:19 AM

## 2018-10-09 IMAGING — CT CT ABD-PELV W/ CM
2 of 5 series · 16 of 46 positions shown, 18 images · IV contrast (APPLIED)
Comparison: 02/22/2017

CLINICAL DATA: Abdominal pain with nausea and vomiting.

EXAM:
CT ABDOMEN AND PELVIS WITH CONTRAST
TECHNIQUE: Multidetector CT imaging of the abdomen and pelvis was performed
using the standard protocol following bolus administration of
intravenous contrast.
CONTRAST:  100mL JJTNR1-WYY IOPAMIDOL (JJTNR1-WYY) INJECTION 61%

[Series 2: routine abd/pel with · axial · 0.84mm/px · z∈[-526,-76]mm · 13 of 102 slices shown, 15 images]
[im 6/102  soft-tissue]
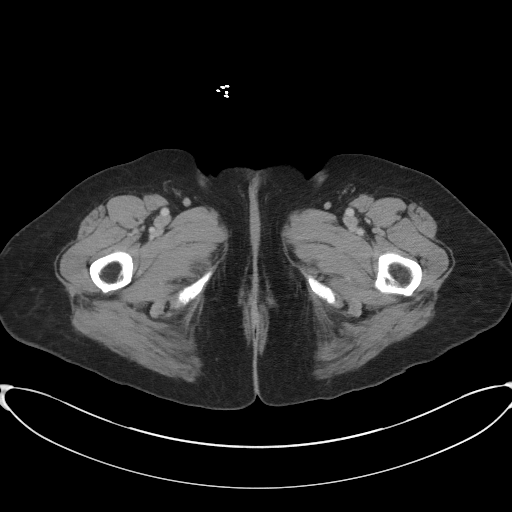
[im 6/102  bone]
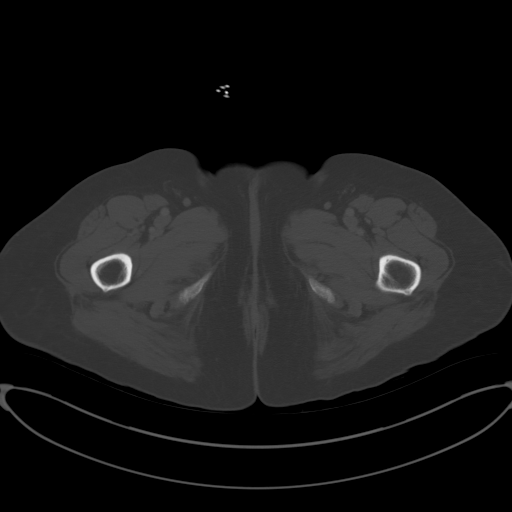
[im 16/102  soft-tissue]
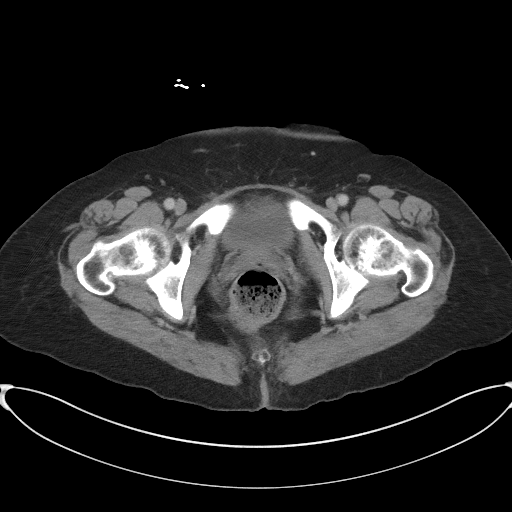
[im 21/102  soft-tissue]
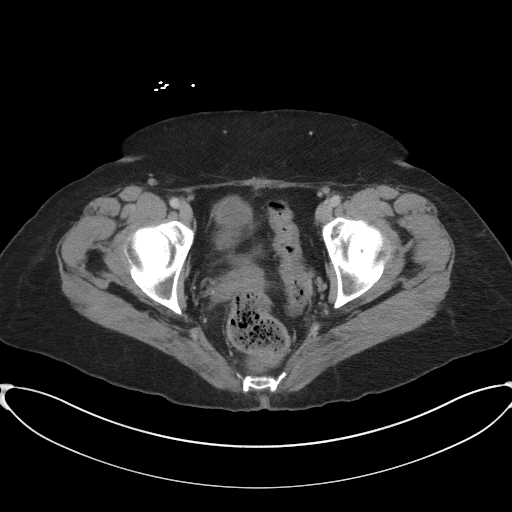
[im 31/102  soft-tissue]
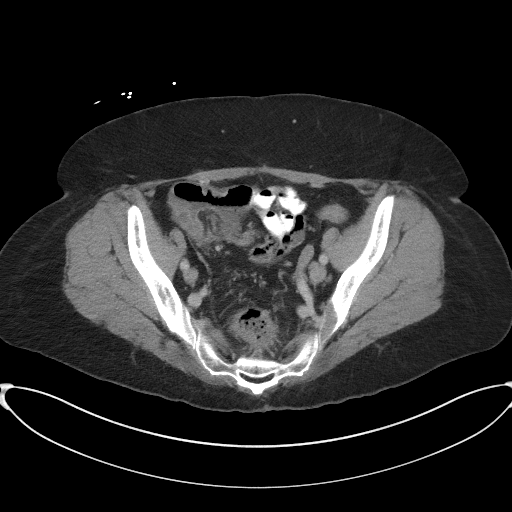
[im 36/102  soft-tissue]
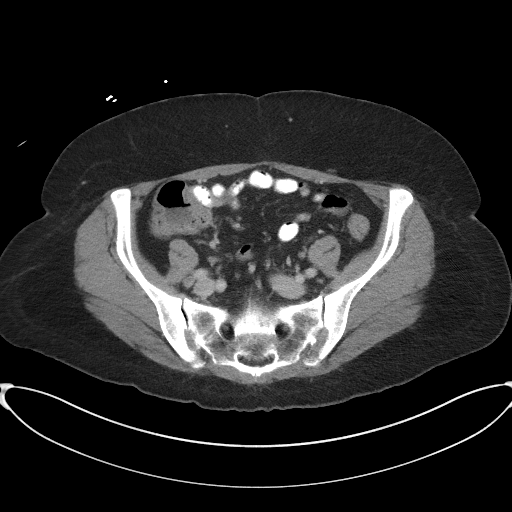
[im 46/102  soft-tissue]
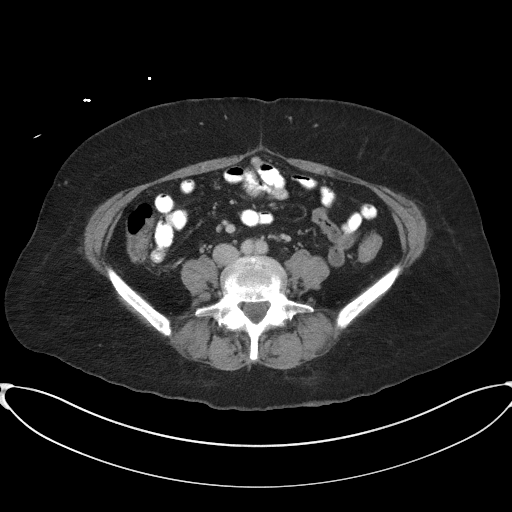
[im 51/102  soft-tissue]
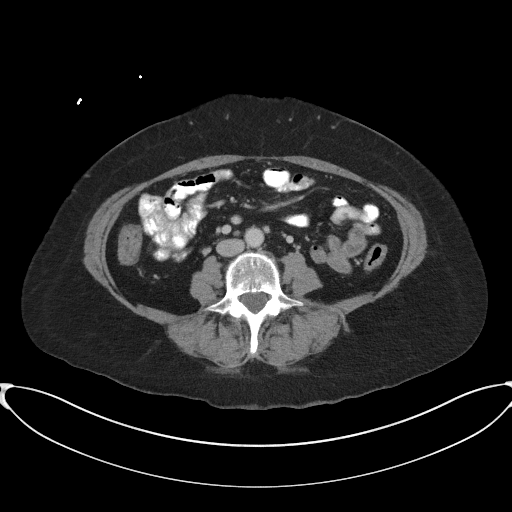
[im 56/102  soft-tissue]
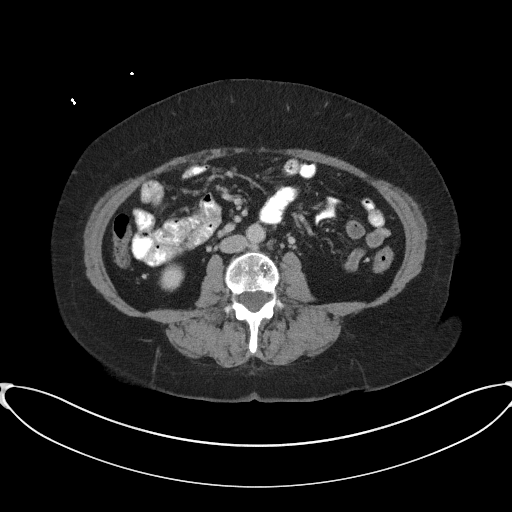
[im 66/102  soft-tissue]
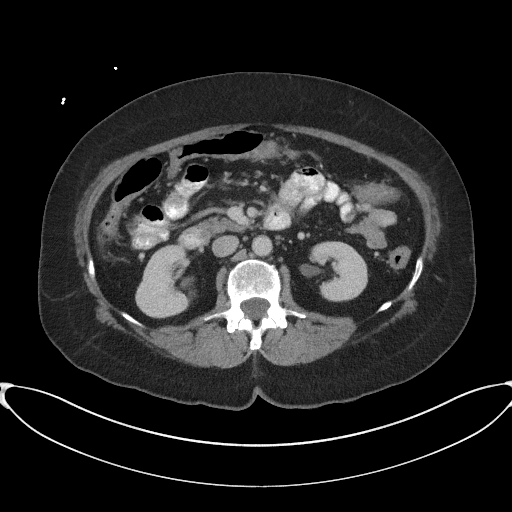
[im 66/102  bone]
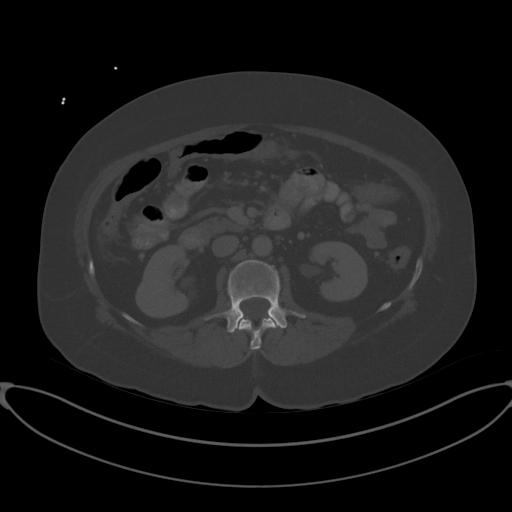
[im 71/102  soft-tissue]
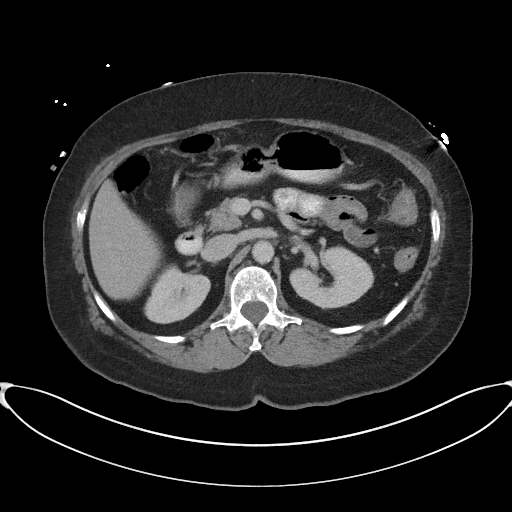
[im 81/102  soft-tissue]
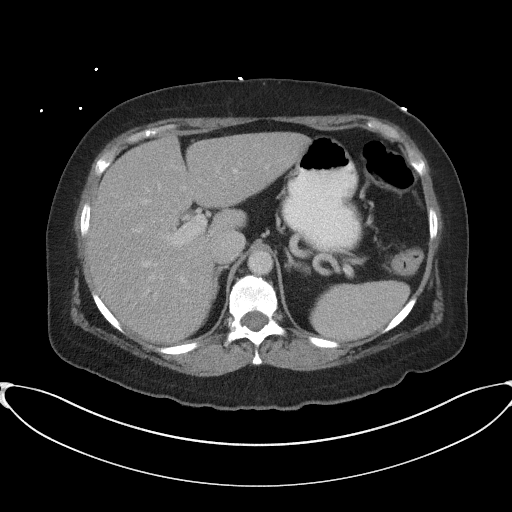
[im 86/102  soft-tissue]
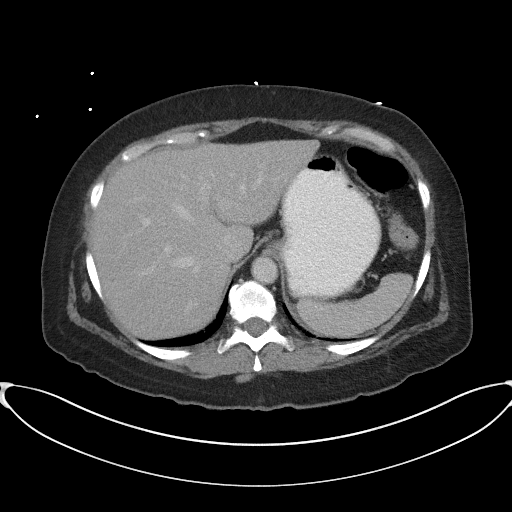
[im 96/102  soft-tissue]
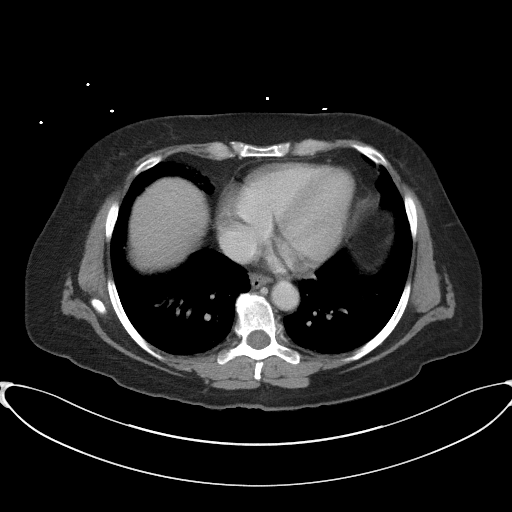

[Series 5: coronal st · coronal · 0.80mm/px · 3 of 89 slices shown]
[im 30/89  soft-tissue]
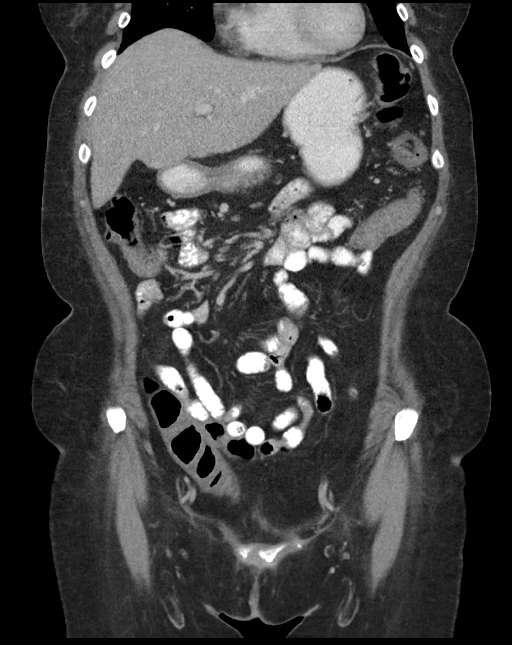
[im 40/89  soft-tissue]
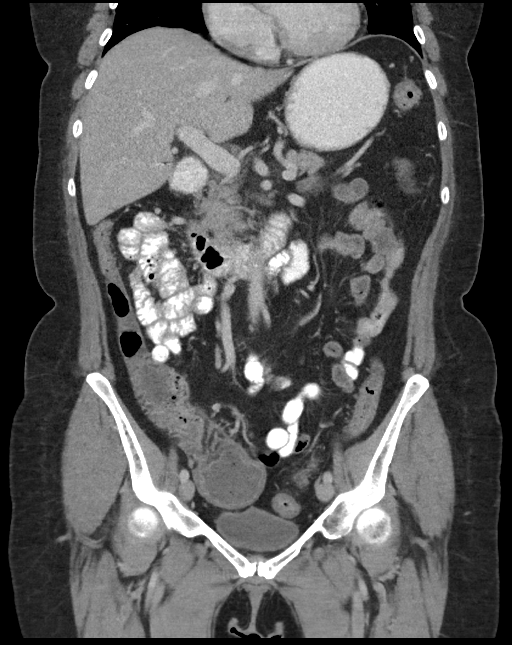
[im 49/89  soft-tissue]
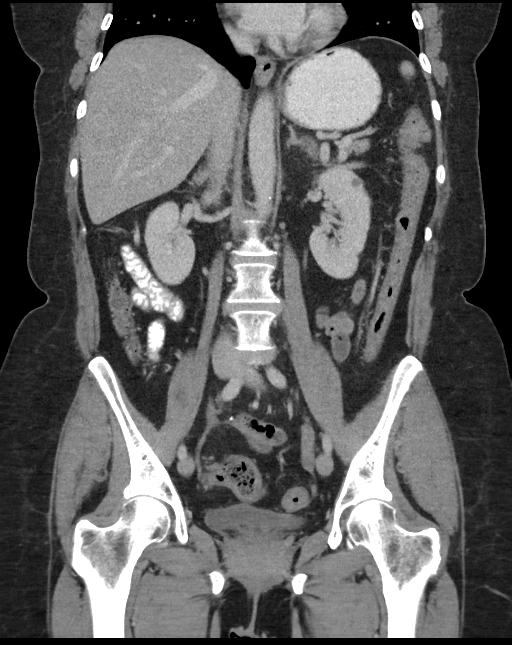

[16 of 46 positions shown; findings below may reference images not displayed]

FINDINGS: Lower chest: No acute abnormality.

Hepatobiliary: No focal liver abnormality is seen. Status post
cholecystectomy. No biliary dilatation.

Pancreas: Unremarkable. No pancreatic ductal dilatation or
surrounding inflammatory changes.

Spleen: Normal in size without focal abnormality.

Adrenals/Urinary Tract: Normal except for a 6 mm cyst on the lateral
aspect of the upper pole of the left kidney.

Stomach/Bowel: Stomach is within normal limits. Appendix has been
removed. No evidence of bowel wall thickening, distention, or
inflammatory changes.

Vascular/Lymphatic: No significant vascular findings are present. No
enlarged abdominal or pelvic lymph nodes.

Reproductive: Status post hysterectomy. No adnexal masses. Left
ovary is visible and appears normal. Right ovary is not discretely
identified.

Other: No abdominal wall hernia or abnormality. No abdominopelvic
ascites.

Musculoskeletal: No acute or significant osseous findings.
IMPRESSION: Benign-appearing abdomen and pelvis.

## 2018-10-09 IMAGING — DX DG CHEST 1V PORT
1 series · 1 of 1 positions shown · non-contrast
Comparison: Radiographs July 07, 2014.

CLINICAL DATA: Acute generalized abdominal pain.

EXAM:
PORTABLE CHEST 1 VIEW

[chest ap]
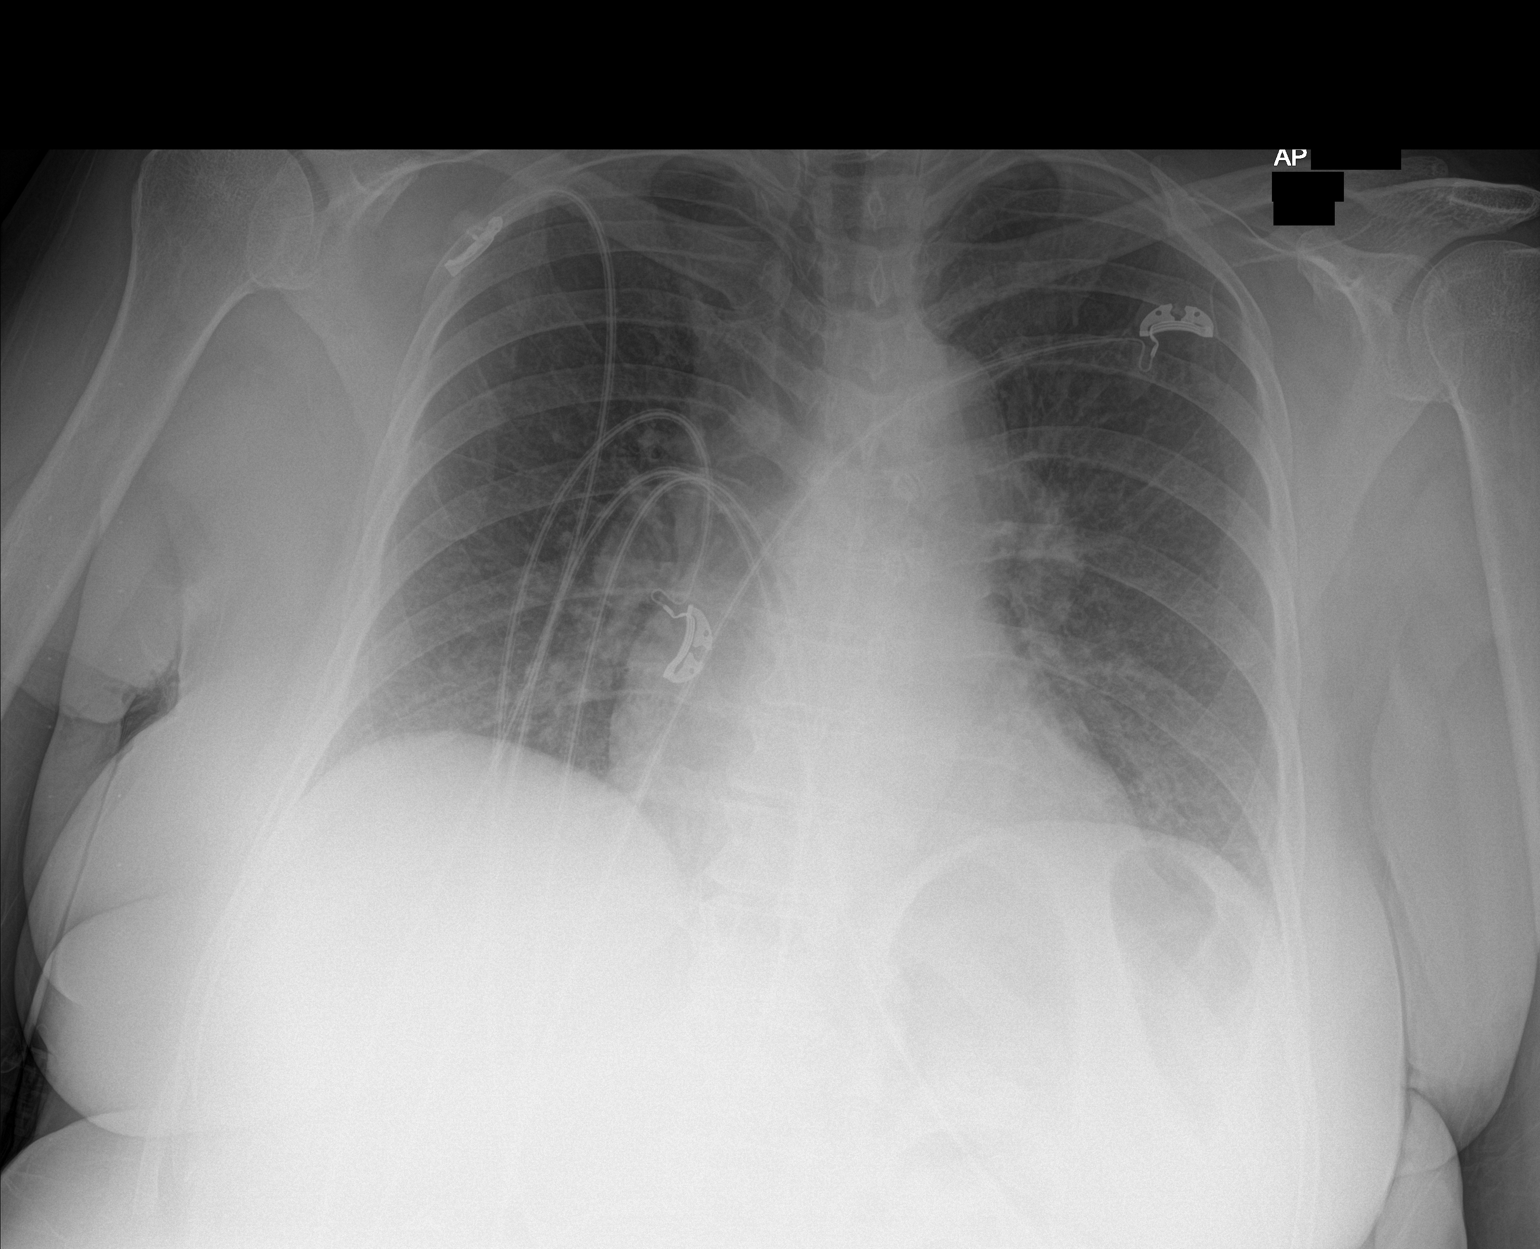

[1 of 1 positions shown; findings below may reference images not displayed]

FINDINGS: The heart size and mediastinal contours are within normal limits.
Both lungs are clear. No pneumothorax or pleural effusion is noted.
The visualized skeletal structures are unremarkable.
IMPRESSION: No acute cardiopulmonary abnormality seen.

## 2018-12-04 DIAGNOSIS — E6609 Other obesity due to excess calories: Secondary | ICD-10-CM | POA: Insufficient documentation

## 2018-12-20 DIAGNOSIS — C4491 Basal cell carcinoma of skin, unspecified: Secondary | ICD-10-CM

## 2018-12-20 HISTORY — DX: Basal cell carcinoma of skin, unspecified: C44.91

## 2019-09-19 ENCOUNTER — Emergency Department: Payer: Medicare HMO

## 2019-09-19 ENCOUNTER — Other Ambulatory Visit: Payer: Self-pay

## 2019-09-19 ENCOUNTER — Emergency Department (HOSPITAL_COMMUNITY): Payer: Medicare HMO

## 2019-09-19 ENCOUNTER — Encounter: Payer: Self-pay | Admitting: Emergency Medicine

## 2019-09-19 ENCOUNTER — Emergency Department
Admission: EM | Admit: 2019-09-19 | Discharge: 2019-09-19 | Disposition: A | Discharge status: Home / Self Care | Payer: Medicare HMO | Attending: Emergency Medicine | Admitting: Emergency Medicine

## 2019-09-19 DIAGNOSIS — Z79899 Other long term (current) drug therapy: Secondary | ICD-10-CM | POA: Diagnosis not present

## 2019-09-19 DIAGNOSIS — I10 Essential (primary) hypertension: Secondary | ICD-10-CM | POA: Diagnosis not present

## 2019-09-19 DIAGNOSIS — Z87891 Personal history of nicotine dependence: Secondary | ICD-10-CM | POA: Diagnosis not present

## 2019-09-19 DIAGNOSIS — G500 Trigeminal neuralgia: Secondary | ICD-10-CM | POA: Insufficient documentation

## 2019-09-19 DIAGNOSIS — R29810 Facial weakness: Secondary | ICD-10-CM | POA: Insufficient documentation

## 2019-09-19 DIAGNOSIS — R791 Abnormal coagulation profile: Secondary | ICD-10-CM | POA: Insufficient documentation

## 2019-09-19 DIAGNOSIS — E039 Hypothyroidism, unspecified: Secondary | ICD-10-CM | POA: Insufficient documentation

## 2019-09-19 LAB — COMPREHENSIVE METABOLIC PANEL
ALT: 34 U/L (ref 0–44)
AST: 27 U/L (ref 15–41)
Albumin: 3.9 g/dL (ref 3.5–5.0)
Alkaline Phosphatase: 91 U/L (ref 38–126)
Anion gap: 12 (ref 5–15)
BUN: 15 mg/dL (ref 8–23)
CO2: 23 mmol/L (ref 22–32)
Calcium: 8.3 mg/dL — ABNORMAL LOW (ref 8.9–10.3)
Chloride: 104 mmol/L (ref 98–111)
Creatinine, Ser: 0.74 mg/dL (ref 0.44–1.00)
GFR calc Af Amer: >60 mL/min (ref >60)
GFR calc non Af Amer: >60 mL/min (ref >60)
Glucose, Bld: 173 mg/dL — ABNORMAL HIGH (ref 70–99)
Potassium: 3.5 mmol/L (ref 3.5–5.1)
Sodium: 139 mmol/L (ref 135–145)
Total Bilirubin: 0.7 mg/dL (ref 0.3–1.2)
Total Protein: 7.2 g/dL (ref 6.5–8.1)

## 2019-09-19 LAB — DIFFERENTIAL
Abs Immature Granulocytes: 0.02 10*3/uL (ref 0.00–0.07)
Basophils Absolute: 0 10*3/uL (ref 0.0–0.1)
Basophils Relative: 1 %
Eosinophils Absolute: 0.1 10*3/uL (ref 0.0–0.5)
Eosinophils Relative: 2 %
Immature Granulocytes: 0 %
Lymphocytes Relative: 29 %
Lymphs Abs: 1.9 10*3/uL (ref 0.7–4.0)
Monocytes Absolute: 0.4 10*3/uL (ref 0.1–1.0)
Monocytes Relative: 6 %
Neutro Abs: 4.1 10*3/uL (ref 1.7–7.7)
Neutrophils Relative %: 62 %

## 2019-09-19 LAB — CBC
HCT: 43.9 % (ref 36.0–46.0)
Hemoglobin: 14 g/dL (ref 12.0–15.0)
MCH: 27.2 pg (ref 26.0–34.0)
MCHC: 31.9 g/dL (ref 30.0–36.0)
MCV: 85.2 fL (ref 80.0–100.0)
Platelets: 330 10*3/uL (ref 150–400)
RBC: 5.15 MIL/uL — ABNORMAL HIGH (ref 3.87–5.11)
RDW: 14 % (ref 11.5–15.5)
WBC: 6.6 10*3/uL (ref 4.0–10.5)
nRBC: 0 % (ref 0.0–0.2)

## 2019-09-19 LAB — PROTIME-INR
INR: 0.9 (ref 0.8–1.2)
Prothrombin Time: 12.2 seconds (ref 11.4–15.2)

## 2019-09-19 LAB — APTT: aPTT: 33 seconds (ref 24–36)

## 2019-09-19 MED ORDER — SODIUM CHLORIDE 0.9% FLUSH: 3.0000 mL | Freq: Once | INTRAVENOUS | Status: DC

## 2019-09-19 MED ORDER — PREDNISONE 10 MG PO TABS: 10.0000 mg | ORAL_TABLET | Freq: Every day | ORAL | 0 refills | Status: AC

## 2019-09-19 NOTE — ED Provider Notes (Signed)
[facility] Emergency Department Provider Note  ____________________________________________  Time seen: Approximately 8:44 PM  I have reviewed the triage vital signs and the nursing notes.   HISTORY  Chief Complaint Numbness    HPI Rebecca Conner is a 69 y.o. female who presents the emergency department complaining of headache, left-sided facial droop.  Patient states that she has been having severe intermittent headaches for approximately a month.  Patient developed a headache last night, now is experiencing left-sided facial droop that started today.  Patient states that she does have pain extending from her ear along her forehead and into her cheek region.  Patient has a history of Bell's to the right side.  Patient states that she had good resolution of symptoms.  No history of Bell's to the left side.  Patient denies any other complaint of fevers or chills, URI symptoms, chest pain, shortness of breath, domino pain, nausea or vomiting.        Past Medical History:  Diagnosis Date  . Arthritis   . Bell's palsy    H/O  . Bell's palsy   . Complication of anesthesia   . Disease of thyroid gland   . GERD (gastroesophageal reflux disease)   . History of Clostridium difficile colitis   . Hyperlipidemia   . Hypertension    NO MEDS  . Hypothyroidism   . PONV (postoperative nausea and vomiting)     Patient Active Problem List   Diagnosis Date Noted  . Clostridium difficile infection 03/15/2017  . Sepsis ([place]) 03/06/2017  . 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 Conductive hearing loss, unilateral 10/03/2012    Past Surgical History:  Procedure Laterality Date  . ABDOMINAL HYSTERECTOMY    . APPENDECTOMY    . BREAST CYST EXCISION    . CATARACT EXTRACTION W/PHACO  Right 05/02/2018   Procedure: CATARACT EXTRACTION PHACO AND INTRAOCULAR LENS PLACEMENT ([place]);  Surgeon: Marchia Meiers, MD;  Location: [facility] ORS;  Service: Ophthalmology;  Laterality: Right;  [place]  00:40 AP% CDE 7.17 Fluid pack lot # [id number]  . CATARACT EXTRACTION W/PHACO Left 06/06/2018   Procedure: CATARACT EXTRACTION PHACO AND INTRAOCULAR LENS PLACEMENT (I[facility]);  Surgeon: Marchia Meiers, MD;  Location: [facility] ORS;  Service: Ophthalmology;  Laterality: Left;  [place] 00:34.0 CDE 6.36 FLUID PACK LOT # [id number]  . CHOLECYSTECTOMY    . COLONOSCOPY    . COLONOSCOPY WITH PROPOFOL N/A 03/11/2018   Procedure: COLONOSCOPY WITH PROPOFOL;  Surgeon: Lollie Sails, MD;  Location: [facility] ENDOSCOPY;  Service: Endoscopy;  Laterality: N/A;  . Left Foot Surgery    . Left Knee Surgery    . RCR    . THYROID SURGERY      Prior to Admission medications   Medication Sig Start Date End Date Taking? Authorizing Provider  acetaminophen (TYLENOL) 500 MG tablet Take 500 mg by mouth daily as needed for moderate pain or headache.    [provider]  cetirizine (ZYRTEC) 10 MG tablet Take 10 mg by mouth daily.    [provider]  cyanocobalamin 1000 MCG tablet Take 1,000 mcg by mouth daily.    [provider]  ezetimibe (ZETIA) 10 MG tablet Take 10 mg by mouth every evening.  02/07/17   [provider]  Naphazoline-Glycerin (CLEAR EYES COOLING COMFORT) 0.03-0.5 % SOLN  Place 1 drop into both eyes 3 (three) times daily as needed (for dry/irritated eyes).    [provider]  pantoprazole (PROTONIX) 20 MG tablet Take 20 mg by mouth at bedtime.  02/20/18   [provider]  PREDNISOLON-GATIFLOX-BROMFENAC OP Place 1 drop into the left eye See admin instructions. Instill 1 drop 6 times daily for 1 week, then 4 times daily for 1 week, then 3 times daily for 1 week, then 2 times daily for 1 week, then 1 time daily until finished    [provider]  predniSONE (DELTASONE) 10 MG  tablet Take 1 tablet (10 mg total) by mouth daily. 09/19/19   Jaxen Samples, Charline Bills, PA-C  SYNTHROID 112 MCG tablet Take 112 mcg by mouth daily before breakfast.  04/05/17   [provider]    Allergies Guaifenesin, Lisinopril, Tetanus toxoids, Ciprofloxacin, Fluticasone, Imodium [loperamide], Lovastatin, Metronidazole, Paroxetine, Rosuvastatin calcium, and Motrin [ibuprofen]  Family History  Problem Relation Age of Onset  . Cancer Father   . Hypertension Father   . Stroke Father   . Heart attack Father   . Stroke Mother   . Breast cancer Sister   . Heart attack Brother   . Hypertension Brother     Social History Social History   Tobacco Use  . Smoking status: Former Research scientist (life sciences)  . Smokeless tobacco: Never Used  Substance Use Topics  . Alcohol use: No  . Drug use: No     Review of Systems  Constitutional: No fever/chills Eyes: No visual changes. No discharge ENT: No upper respiratory complaints. Cardiovascular: no chest pain. Respiratory: no cough. No SOB. Gastrointestinal: No abdominal pain.  No nausea, no vomiting.  No diarrhea.  No constipation. Musculoskeletal: Negative for musculoskeletal pain. Skin: Negative for rash, abrasions, lacerations, ecchymosis. Neurological: Positive for headache with left-sided facial droop.  Denies unilateral focal weakness or numbness. 10-point ROS otherwise negative.  ____________________________________________   PHYSICAL EXAM:  VITAL SIGNS: ED Triage Vitals  Enc Vitals Group     BP 09/19/19 1728 (!) 165/83     Pulse Rate 09/19/19 1728 82     Resp 09/19/19 1728 16     Temp 09/19/19 1728 97.7 F (36.5 C)     Temp Source 09/19/19 1728 Oral     SpO2 09/19/19 1728 98 %     Weight 09/19/19 1729 199 lb 6.4 oz (90.4 kg)     Height 09/19/19 1729 5\' 6"  (1.676 m)     Head Circumference --      Peak Flow --      Pain Score 09/19/19 1728 0     Pain Loc --      Pain Edu? --      Excl. in [place]? --      Constitutional: Alert  and oriented. Well appearing and in no acute distress. Eyes: Conjunctivae are normal. PERRL. EOMI. Head: Visualization of the face reveals left-sided facial droop.  This is most pronounced around the mouth, lateral eye.  On palpation, patient is diffusely tender to palpation of the trigeminal distribution.  No palpable abnormality or cords. ENT:      Ears:       Nose: No congestion/rhinnorhea.      Mouth/Throat: Mucous membranes are moist.  Neck: No stridor.  Neck is supple full range of motion Hematological/Lymphatic/Immunilogical: No cervical lymphadenopathy. Cardiovascular: Normal rate, regular rhythm. Normal S1 and S2.  Good peripheral circulation. Respiratory: Normal respiratory effort without tachypnea or retractions. Lungs CTAB. Good air entry to the  bases with no decreased or absent breath sounds. Musculoskeletal: Full range of motion to all extremities. No gross deformities appreciated. Neurologic:  Normal speech and language.  Cranial nerve testing is performed.  Patient is able to follow all commands, however patient does have deficits to the left facial nerve distribution.  Patient is having droop, specifically with the lower left eyelid, left corner of the mouth.  This spares the forehead.  Grip strength is equal bilaterally.  Good approximation all digits.  Negative Romberg's.  Negative pronator drift. Skin:  Skin is warm, dry and intact. No rash noted. Psychiatric: Mood and affect are normal. Speech and behavior are normal. Patient exhibits appropriate insight and judgement.   ____________________________________________   LABS (all labs ordered are listed, but only abnormal results are displayed)  Labs Reviewed  CBC - Abnormal; Notable for the following components:      Result Value   RBC 5.15 (*)    All other components within normal limits  COMPREHENSIVE METABOLIC PANEL - Abnormal; Notable for the following components:   Glucose, Bld 173 (*)    Calcium 8.3 (*)    All  other components within normal limits  PROTIME-INR  APTT  DIFFERENTIAL  CBG MONITORING, ED   ____________________________________________  EKG   ____________________________________________  RADIOLOGY I personally viewed and evaluated these images as part of my medical decision making, as well as reviewing the written report by the radiologist.  CT HEAD WO CONTRAST  Result Date: 09/19/2019 CLINICAL DATA:  Facial numbness EXAM: CT HEAD WITHOUT CONTRAST TECHNIQUE: Contiguous axial images were obtained from the base of the skull through the vertex without intravenous contrast. COMPARISON:  None. FINDINGS: Brain: There is no mass, hemorrhage or extra-axial collection. The size and configuration of the ventricles and extra-axial CSF spaces are normal. There is hypoattenuation of the white matter, most commonly indicating chronic small vessel disease. Vascular: No abnormal hyperdensity of the major intracranial arteries or dural venous sinuses. No intracranial atherosclerosis. Skull: The visualized skull base, calvarium and extracranial soft tissues are normal. Sinuses/Orbits: No fluid levels or advanced mucosal thickening of the visualized paranasal sinuses. No mastoid or middle ear effusion. The orbits are normal. IMPRESSION: Chronic small vessel disease without acute intracranial abnormality. Electronically Signed   By: Ulyses Jarred M.D.   On: 09/19/2019 18:19   MR BRAIN WO CONTRAST  Result Date: 09/19/2019 CLINICAL DATA:  Acute presentation with facial numbness on the left beginning today. EXAM: MRI HEAD WITHOUT CONTRAST TECHNIQUE: Multiplanar, multiecho pulse sequences of the brain and surrounding structures were obtained without intravenous contrast. COMPARISON:  Head CT same day FINDINGS: Brain: Diffusion imaging does not show any acute or subacute infarction. Brainstem and cerebellum are normal. CP angle regions appear normal. No visible seventh or eighth nerve pathology on this standard  exam. Cerebral hemispheres show moderate chronic small-vessel ischemic changes of the deep and subcortical white matter. No large vessel territory infarction. No mass lesion, hemorrhage, hydrocephalus or extra-axial collection. Vascular: Major vessels at the base of the brain show flow. Skull and upper cervical spine: Negative Sinuses/Orbits: Clear/normal Other: None IMPRESSION: No acute finding by MRI. Moderate chronic small-vessel ischemic changes of the cerebral hemispheric white matter. Electronically Signed   By: Nelson Chimes M.D.   On: 09/19/2019 22:37    ____________________________________________    PROCEDURES  Procedure(s) performed:    Procedures    Medications  sodium chloride flush (NS) 0.9 % injection 3 mL (3 mLs Intravenous Not Given 09/19/19 2035)  ____________________________________________   INITIAL IMPRESSION / ASSESSMENT AND PLAN / ED COURSE  Pertinent labs & imaging results that were available during my care of the patient were reviewed by me and considered in my medical decision making (see chart for details).  Review of the [place] CSRS was performed in accordance of the [place] prior to dispensing any controlled drugs.           Patient's diagnosis is consistent with trigeminal neuralgia.  Patient presented to emergency department complaining of left-sided facial numbness with intermittent pain.  Patient was also noted to have left-sided facial droop and was complaining of a headache.  Differential included CVA, Bell's palsy, trigeminal neuralgia, temporal arteritis.  Patient had no deficits other than facial droop, however this.  The forehead.  As such, even with a reassuring CT head, MRI was pursued to ensure no central lesion.  MRI is reassuring at this time.  Patient continues to have left-sided facial droop, differential includes facial nerve involvement, trigeminal nerve involvement.  At this time patient will be placed on a short course of steroids.   Follow-up with neurology..  Patient is given ED precautions to return to the ED for any worsening or new symptoms.     ____________________________________________  FINAL CLINICAL IMPRESSION(S) / ED DIAGNOSES  Final diagnoses:  Facial droop  Trigeminal neuralgia of left side of face      NEW MEDICATIONS STARTED DURING THIS VISIT:  ED Discharge Orders         Ordered    predniSONE (DELTASONE) 10 MG tablet  Daily    Note to Pharmacy: Take 6 pills x 2 days, 5 pills x 2 days, 4 pills x 2 days, 3 pills x 2 days, 2 pills x 2 days, and 1 pill x 2 days   09/19/19 2258              This chart was dictated using voice recognition software/Dragon. Despite best efforts to proofread, errors can occur which can change the meaning. Any change was purely unintentional.    Darletta Moll, PA-C 09/19/19 2258    Earleen Newport, MD 09/19/19 [phone]

## 2019-09-19 NOTE — ED Triage Notes (Signed)
Pt to ED via POV for facial numbness. Pt states that she woke up with her symptoms this morning. Pt has decreased sensation on the left side of her face. Equal sensation in rest of body. Pt does not have slurred speech or facial droop at this time. Pt is in NAD at this time.

## 2019-09-19 NOTE — ED Notes (Signed)
Mr. Olen Cordial left contact number for pt as this person is who will give pt a ride home [phone]

## 2019-09-25 DIAGNOSIS — I10 Essential (primary) hypertension: Secondary | ICD-10-CM | POA: Insufficient documentation

## 2019-09-25 DIAGNOSIS — Z8673 Personal history of transient ischemic attack (TIA), and cerebral infarction without residual deficits: Secondary | ICD-10-CM | POA: Insufficient documentation

## 2019-09-25 DIAGNOSIS — G500 Trigeminal neuralgia: Secondary | ICD-10-CM | POA: Insufficient documentation

## 2019-11-19 ENCOUNTER — Encounter: Payer: Self-pay | Admitting: Dermatology

## 2019-11-19 ENCOUNTER — Ambulatory Visit (INDEPENDENT_AMBULATORY_CARE_PROVIDER_SITE_OTHER): Payer: Medicare HMO | Admitting: Dermatology

## 2019-11-19 ENCOUNTER — Other Ambulatory Visit: Payer: Self-pay

## 2019-11-19 DIAGNOSIS — L239 Allergic contact dermatitis, unspecified cause: Secondary | ICD-10-CM | POA: Diagnosis not present

## 2019-11-19 DIAGNOSIS — L578 Other skin changes due to chronic exposure to nonionizing radiation: Secondary | ICD-10-CM

## 2019-11-19 DIAGNOSIS — L719 Rosacea, unspecified: Secondary | ICD-10-CM

## 2019-11-19 DIAGNOSIS — D485 Neoplasm of uncertain behavior of skin: Secondary | ICD-10-CM

## 2019-11-19 DIAGNOSIS — Z872 Personal history of diseases of the skin and subcutaneous tissue: Secondary | ICD-10-CM | POA: Diagnosis not present

## 2019-11-19 MED ORDER — DOXYCYCLINE HYCLATE 20 MG PO TABS: 20.0000 mg | ORAL_TABLET | Freq: Two times a day (BID) | ORAL | 3 refills | Status: AC

## 2019-11-19 NOTE — Progress Notes (Signed)
Follow-Up Visit   Subjective  Rebecca Conner is a 69 y.o. female who presents for the following: Follow-up (AK follow up) and Rosacea.  She was using the skin medicinal's Azelaic Acid, Metronidazole, and Ivermectin cream for rosacea which made her break out in an itchy rash on the face.  She stopped and it improved but then it came back when she restarted the medication.    Patient advises AK treated about 10 weeks ago at L forehead above brow has resolved.   She reports a spot on her lower back that has been irritated and bothersome for her.  The following portions of the chart were reviewed this encounter and updated as appropriate:  Tobacco  Allergies  Meds  Problems  Med Hx  Surg Hx  Fam Hx     Review of Systems:  No other skin or systemic complaints except as noted in HPI or Assessment and Plan.  Objective  Well appearing patient in no apparent distress; mood and affect are within normal limits.  A focused examination was performed including face and back. Relevant physical exam findings are noted in the Assessment and Plan.  Objective  Face: History of allergic reaction to [employer] metronidazole/ivermectin/azelaic acid triple cream for rosacea.   Objective  Left Lower Back: 0.6cm erythematous soft papule     Objective  Face: Mid face erythema, scattered inflammatory papules   Assessment & Plan  Allergic dermatitis Face  Start HC 2.5% cream BID to face PRN rash until clear.   Neoplasm of uncertain behavior of skin Left Lower Back  Epidermal / dermal shaving  Lesion length (cm):  0.6 Lesion width (cm):  0.6 Margin per side (cm):  0.1 Total excision diameter (cm):  0.8 Informed consent: discussed and consent obtained   Timeout: patient name, date of birth, surgical site, and procedure verified   Patient was prepped and draped in usual sterile fashion: area prepped with isopropyl alcohol. Anesthesia: the lesion was anesthetized in a  standard fashion   Anesthetic:  1% lidocaine w/ epinephrine 1-100,000 buffered w/ 8.4% NaHCO3 Instrument used: flexible razor blade   Hemostasis achieved with: aluminum chloride   Outcome: patient tolerated procedure well   Post-procedure details: wound care instructions given   Additional details:  Mupirocin and a bandage applied  Specimen 1 - Surgical pathology Differential Diagnosis: Irritated Nevus vs Neurofibroma vs Achrochordon Check Margins: No 0.6cm erythematous soft papule  Rosacea Face  Chronic, flared.  She did not tolerate the skin medicinal's metronidazole, ivermectin, azelaic acid cream.  It is unclear what ingredient in the cream was giving her difficulty.  Patient also with some ocular symptoms (irritation, grittiness) occasionally that may be ocular rosacea.  She will discuss with eye doctor at follow-up.  Start doxycycline 20mg  1 PO BID with food #60 2RF.   Doxycycline should be taken with food to prevent nausea. Do not lay down for 30 minutes after taking. Be cautious with sun exposure and use good sun protection while on this medication. Pregnant women should not take this medication.   Also gave patient a sample of Collie Siad mantra to try applying daily to an area inside the wrist for 1 week before trying it on her face to see if she develops a rash at that site due to likely allergic contact dermatitis to [employer] metronidazole/ivermectin/azelaic acid cream  Ordered Medications: doxycycline (PERIOSTAT) 20 MG tablet  Actinic Damage - diffuse scaly erythematous macules with underlying dyspigmentation - Recommend daily broad spectrum sunscreen SPF  30+ to sun-exposed areas, reapply every 2 hours as needed.  - Call for new or changing lesions.  Return in about 4 months (around 03/20/2020) for TBSE. rosacea follow up.   Graciella Belton, RMA, am acting as scribe for Forest Gleason, MD .   Documentation: I have reviewed the above documentation for accuracy and  completeness, and I agree with the above.  Forest Gleason, MD

## 2019-11-19 NOTE — Patient Instructions (Addendum)
Doxycycline should be taken with food to prevent nausea. Do not lay down for 30 minutes after taking. Be cautious with sun exposure and use good sun protection while on this medication. Pregnant women should not take this medication.   Wound Care Instructions  1. Cleanse wound gently with soap and water once a day then pat dry with clean gauze. Apply a thing coat of Petrolatum (petroleum jelly, "Vaseline") over the wound (unless you have an allergy to this). We recommend that you use a new, sterile tube of Vaseline. Do not pick or remove scabs. Do not remove the yellow or white "healing tissue" from the base of the wound.  2. Cover the wound with fresh, clean, nonstick gauze and secure with paper tape. You may use Band-Aids in place of gauze and tape if the would is small enough, but would recommend trimming much of the tape off as there is often too much. Sometimes Band-Aids can irritate the skin.  3. You should call the office for your biopsy report after 1 week if you have not already been contacted.  4. If you experience any problems, such as abnormal amounts of bleeding, swelling, significant bruising, significant pain, or evidence of infection, please call the office immediately.  5. FOR ADULT SURGERY PATIENTS: If you need something for pain relief you may take 1 extra strength Tylenol (acetaminophen) AND 2 Ibuprofen (200mg each) together every 4 hours as needed for pain. (do not take these if you are allergic to them or if you have a reason you should not take them.) Typically, you may only need pain medication for 1 to 3 days.     

## 2019-11-21 ENCOUNTER — Telehealth: Payer: Self-pay

## 2019-11-21 NOTE — Progress Notes (Signed)
Skin , left lower back NEVUS LIPOMATOSUS SUPERFICIALIS  This is a benign growth. No additional treatment is needed.

## 2019-11-21 NOTE — Telephone Encounter (Signed)
Patient was called and informed of bx results, Patient verbalized understanding

## 2020-03-25 ENCOUNTER — Ambulatory Visit (INDEPENDENT_AMBULATORY_CARE_PROVIDER_SITE_OTHER): Payer: Medicare HMO | Admitting: Dermatology

## 2020-03-25 ENCOUNTER — Other Ambulatory Visit: Payer: Self-pay | Admitting: Family Medicine

## 2020-03-25 ENCOUNTER — Other Ambulatory Visit: Payer: Self-pay

## 2020-03-25 DIAGNOSIS — L570 Actinic keratosis: Secondary | ICD-10-CM | POA: Diagnosis not present

## 2020-03-25 DIAGNOSIS — L304 Erythema intertrigo: Secondary | ICD-10-CM

## 2020-03-25 DIAGNOSIS — Z1283 Encounter for screening for malignant neoplasm of skin: Secondary | ICD-10-CM | POA: Diagnosis not present

## 2020-03-25 DIAGNOSIS — Z85828 Personal history of other malignant neoplasm of skin: Secondary | ICD-10-CM | POA: Diagnosis not present

## 2020-03-25 DIAGNOSIS — D485 Neoplasm of uncertain behavior of skin: Secondary | ICD-10-CM | POA: Diagnosis not present

## 2020-03-25 DIAGNOSIS — L719 Rosacea, unspecified: Secondary | ICD-10-CM | POA: Diagnosis not present

## 2020-03-25 DIAGNOSIS — L814 Other melanin hyperpigmentation: Secondary | ICD-10-CM

## 2020-03-25 DIAGNOSIS — D229 Melanocytic nevi, unspecified: Secondary | ICD-10-CM

## 2020-03-25 DIAGNOSIS — L821 Other seborrheic keratosis: Secondary | ICD-10-CM

## 2020-03-25 DIAGNOSIS — L578 Other skin changes due to chronic exposure to nonionizing radiation: Secondary | ICD-10-CM

## 2020-03-25 DIAGNOSIS — Z1231 Encounter for screening mammogram for malignant neoplasm of breast: Secondary | ICD-10-CM

## 2020-03-25 DIAGNOSIS — D1800 Hemangioma unspecified site: Secondary | ICD-10-CM

## 2020-03-25 MED ORDER — DOXYCYCLINE HYCLATE 20 MG PO TABS: 20.0000 mg | ORAL_TABLET | Freq: Two times a day (BID) | ORAL | 6 refills | Status: AC

## 2020-03-25 NOTE — Progress Notes (Signed)
Follow-Up Visit   Subjective  Rebecca Conner is a 69 y.o. female who presents for the following: Annual Exam (Patient here today for TBSE. She has a history of BCC. ).  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 Patient not aware of anything new or changing. She does have rosacea and is using over the counter cortisone 10 which she says helps. Patient does get pimples occasionally that last a few days.   The following portions of the chart were reviewed this encounter and updated as appropriate:  Tobacco  Allergies  Meds  Problems  Med Hx  Surg Hx  Fam Hx      Review of Systems:  No other skin or systemic complaints except as noted in HPI or Assessment and Plan.  Objective  Well appearing patient in no apparent distress; mood and affect are within normal limits.  A full examination was performed including scalp, head, eyes, ears, nose, lips, neck, chest, axillae, abdomen, back, buttocks, bilateral upper extremities, bilateral lower extremities, hands, feet, fingers, toes, fingernails, and toenails. All findings within normal limits unless otherwise noted below.  Objective  Face: Mid face erythema with scattered inflammatory papules  Objective  Left cheek x 1: Scaly pink papule  Objective  Left Superior Shoulder: 0.5cm yellow papule     Objective  Left Mid Abdomen: 0.7cm yellow papule   Objective  Upper Mid Back: 0.3cm yellow papule  Objective  Right Mid Back: 0.4cm yellow papule  Objective  Inframammary: Erythematous patch   Assessment & Plan  Rosacea Face  Chronic, flared discontinue hydrocortisone as this can worsen rosacea Patient does get irritation and grittiness of the eyes.  Patient saw eye dr in May and they did not think she has ocular rosacea. Patient not able to tolerate topical metronidazole. Discussed option of soolantra vs doxycycline. Given cost of topicals, will do trial of low dose  doxycycline.  Start doxycycline 20mg  twice daily with food #60 [id number]  Doxycycline should be taken with food to prevent nausea. Do not lay down for 30 minutes after taking. Be cautious with sun exposure and use good sun protection while on this medication. Pregnant women should not take this medication.    doxycycline (PERIOSTAT) 20 MG tablet - Face  AK (actinic keratosis) Left cheek x 1  Consider biopsy if not improved with LN2 at left cheek   Prior to procedure, discussed risks of blister formation, small wound, skin dyspigmentation, or rare scar following cryotherapy.    Destruction of lesion - Left cheek x 1 Complexity: simple   Destruction method: cryotherapy   Informed consent: discussed and consent obtained   Lesion destroyed using liquid nitrogen: Yes   Cryotherapy cycles:  2 Outcome: patient tolerated procedure well with no complications   Post-procedure details: wound care instructions given    Neoplasm of uncertain behavior of skin (4) Left Superior Shoulder  Skin / nail biopsy Type of biopsy: tangential   Informed consent: discussed and consent obtained   Timeout: patient name, date of birth, surgical site, and procedure verified   Patient was prepped and draped in usual sterile fashion: Area prepped with isopropyl alcohol. Anesthesia: the lesion was anesthetized in a standard fashion   Anesthetic:  1% lidocaine w/ epinephrine 1-100,000 buffered w/ 8.4% NaHCO3 Instrument used: flexible razor blade   Hemostasis achieved with: aluminum chloride   Outcome: patient tolerated procedure well   Post-procedure details: wound care instructions given   Additional details:  Mupirocin and  a bandage applied  Specimen 1 - Surgical pathology Differential Diagnosis: r/o Sebaceous Neoplasm Check Margins: No 0.5cm yellow papule  Left Mid Abdomen  Upper Mid Back  Right Mid Back  Favor Benign Sebaceous Neoplasm at left mid abdomen, recheck on follow up. Consider biopsy  pending results from left superior shoulder.   Recheck right mid back and upper mid back on follow up.   Erythema intertrigo Inframammary  Not bothersome. Benign, observe.     Lentigines - Scattered tan macules - Discussed due to sun exposure - Benign, observe - Call for any changes  Seborrheic Keratoses - Stuck-on, waxy, tan-brown papules and plaques  - Discussed benign etiology and prognosis. - Observe - Call for any changes  Melanocytic Nevi - Tan-brown and/or pink-flesh-colored symmetric macules and papules - Benign appearing on exam today - Observation - Call clinic for new or changing moles - Recommend daily use of broad spectrum spf 30+ sunscreen to sun-exposed areas.   Hemangiomas - Red papules - Discussed benign nature - Observe - Call for any changes  Actinic Damage - diffuse scaly erythematous macules with underlying dyspigmentation - Recommend daily broad spectrum sunscreen SPF 30+ to sun-exposed areas, reapply every 2 hours as needed.  - Call for new or changing lesions.  Skin cancer screening performed today.  History of Basal Cell Carcinoma of the Skin - No evidence of recurrence today at right superior nasolabial fold - Recommend regular full body skin exams - Recommend daily broad spectrum sunscreen SPF 30+ to sun-exposed areas, reapply every 2 hours as needed.  - Call if any new or changing lesions are noted between office visits   Return in about 6 months (around 09/25/2020) for TBSE.  Graciella Belton, RMA, am acting as scribe for Forest Gleason, MD .  Documentation: I have reviewed the above documentation for accuracy and completeness, and I agree with the above.  Forest Gleason, MD

## 2020-03-25 NOTE — Patient Instructions (Addendum)
Melanoma ABCDEs  Melanoma is the most dangerous type of skin cancer, and is the leading cause of death from skin disease.  You are more likely to develop melanoma if you:  Have light-colored skin, light-colored eyes, or red or blond hair  Spend a lot of time in the sun  Tan regularly, either outdoors or in a tanning bed  Have had blistering sunburns, especially during childhood  Have a close family member who has had a melanoma  Have atypical moles or large birthmarks  Early detection of melanoma is key since treatment is typically straightforward and cure rates are extremely high if we catch it early.   The first sign of melanoma is often a change in a mole or a new dark spot.  The ABCDE system is a way of remembering the signs of melanoma.  A for asymmetry:  The two halves do not match. B for border:  The edges of the growth are irregular. C for color:  A mixture of colors are present instead of an even brown color. D for diameter:  Melanomas are usually (but not always) greater than 75mm - the size of a pencil eraser. E for evolution:  The spot keeps changing in size, shape, and color.  Please check your skin once per month between visits. You can use a small mirror in front and a large mirror behind you to keep an eye on the back side or your body.   If you see any new or changing lesions before your next follow-up, please call to schedule a visit.  Please continue daily skin protection including broad spectrum sunscreen SPF 30+ to sun-exposed areas, reapplying every 2 hours as needed when you're outdoors.   Rosacea  What is rosacea? Rosacea (say: ro-zay-sha) is a common skin disease that usually begins as a trend of flushing or blushing easily.  As rosacea progresses, a persistent redness in the center of the face will develop and may gradually spread beyond the nose and cheeks to the forehead and chin.  In some cases, the ears, chest, and back could be affected.  Rosacea may  appear as tiny blood vessels or small red bumps that occur in crops.  Frequently they can contain pus, and are called "pustules".  If the bumps do not contain pus, they are referred to as "papules".  Rarely, in prolonged, untreated cases of rosacea, the oil glands of the nose and cheeks may become permanently enlarged.  This is called rhinophyma, and is seen more frequently in men.  Signs and Risks In its beginning stages, rosacea tends to come and go, which makes it difficult to recognize.  It can start as intermittent flushing of the face.  Eventually, blood vessels may become permanently visible.  Pustules and papules can appear, but can be mistaken for adult acne.  People of all races, ages, genders and ethnic groups are at risk of developing rosacea.  However, it is more common in women (especially around menopause) and adults with fair skin between the ages of 82 and 98.  Treatment Dermatologists typically recommend a combination of treatments to effectively manage rosacea.  Treatment can improve symptoms and may stop the progression of the rosacea.  Treatment may involve both topical and oral medications.  The tetracycline antibiotics are often used for their anti-inflammatory effect; however, because of the possibility of developing antibiotic resistance, they should not be used long term at full dose.  For dilated blood vessels the options include electrodessication (uses electric current  through a small needle), laser treatment, and cosmetics to hide the redness.   With all forms of treatment, improvement is a slow process, and patients may not see any results for the first 3-4 weeks.  It is very important to avoid the sun and other triggers.  Patients must wear sunscreen daily.  Skin Care Instructions: 1. Cleanse the skin with a mild soap such as CeraVe cleanser, Cetaphil cleanser, or Dove soap once or twice daily as needed. 2. Moisturize with Eucerin Redness Relief Daily Perfecting Lotion (has  a subtle green tint), CeraVe Moisturizing Cream, or Oil of Olay Daily Moisturizer with sunscreen every morning and/or night as recommended. 3. Makeup should be "non-comedogenic" (won't clog pores) and be labeled "for sensitive skin". Good choices for cosmetics are: Neutrogena, Almay, and Physician's Formula.  Any product with a green tint tends to offset a red complexion. 4. If your eyes are dry and irritated, use artificial tears 2-3 times per day and cleanse the eyelids daily with baby shampoo.  Have your eyes examined at least every 2 years.  Be sure to tell your eye doctor that you have rosacea. 5. Alcoholic beverages tend to cause flushing of the skin, and may make rosacea worse. 6. Always wear sunscreen, protect your skin from extreme hot and cold temperatures, and avoid spicy foods, hot drinks, and mechanical irritation such as rubbing, scrubbing, or massaging the face.  Avoid harsh skin cleansers, cleansing masks, astringents, and exfoliation. If a particular product burns or makes your face feel tight, then it is likely to flare your rosacea. 7. If you are having difficulty finding a sunscreen that you can tolerate, you may try switching to a chemical-free sunscreen.  These are ones whose active ingredient is zinc oxide or titanium dioxide only.  They should also be fragrance free, non-comedogenic, and labeled for sensitive skin. 8. Rosacea triggers may vary from person to person.  There are a variety of foods that have been reported to trigger rosacea.  Some patients find that keeping a diary of what they were doing when they flared helps them avoid triggers.  Doxycycline should be taken with food to prevent nausea. Do not lay down for 30 minutes after taking. Be cautious with sun exposure and use good sun protection while on this medication. Pregnant women should not take this medication.   Cryotherapy Aftercare  . Wash gently with soap and water everyday.   Marland Kitchen Apply Vaseline and Band-Aid  daily until healed.  Prior to procedure, discussed risks of blister formation, small wound, skin dyspigmentation, or rare scar following cryotherapy.   Wound Care Instructions  1. Cleanse wound gently with soap and water once a day then pat dry with clean gauze. Apply a thing coat of Petrolatum (petroleum jelly, "Vaseline") over the wound (unless you have an allergy to this). We recommend that you use a new, sterile tube of Vaseline. Do not pick or remove scabs. Do not remove the yellow or white "healing tissue" from the base of the wound.  2. Cover the wound with fresh, clean, nonstick gauze and secure with paper tape. You may use Band-Aids in place of gauze and tape if the would is small enough, but would recommend trimming much of the tape off as there is often too much. Sometimes Band-Aids can irritate the skin.  3. You should call the office for your biopsy report after 1 week if you have not already been contacted.  4. If you experience any problems, such as abnormal amounts  of bleeding, swelling, significant bruising, significant pain, or evidence of infection, please call the office immediately.  5. FOR ADULT SURGERY PATIENTS: If you need something for pain relief you may take 1 extra strength Tylenol (acetaminophen) AND 2 Ibuprofen (200mg  each) together every 4 hours as needed for pain. (do not take these if you are allergic to them or if you have a reason you should not take them.) Typically, you may only need pain medication for 1 to 3 days.

## 2020-03-29 ENCOUNTER — Telehealth: Payer: Self-pay

## 2020-03-29 ENCOUNTER — Encounter: Payer: Self-pay | Admitting: Dermatology

## 2020-03-29 NOTE — Telephone Encounter (Signed)
Called patient and scheduled a follow up for 05/05/20 at 3:15pm for AK on Left Cheek .

## 2020-03-30 ENCOUNTER — Telehealth: Payer: Self-pay

## 2020-03-30 NOTE — Telephone Encounter (Signed)
Patient advised bx benign oil gland, JS

## 2020-03-30 NOTE — Telephone Encounter (Signed)
-----   Message from Alfonso Patten, MD sent at 03/30/2020  3:14 AM EDT ----- Skin , left superior shoulder SEBACEOUS GLAND HYPERPLASIA  This is a benign oil gland growth.  No additional treatment is needed.  MAs please call

## 2020-03-30 NOTE — Progress Notes (Signed)
Skin , left superior shoulder SEBACEOUS GLAND HYPERPLASIA  This is a benign oil gland growth.  No additional treatment is needed.  MAs please call

## 2020-03-31 NOTE — Progress Notes (Signed)
Skin , left superior shoulder SEBACEOUS GLAND HYPERPLASIA  Benign oil gland growth. No treatment needed.  MAs please call

## 2020-04-22 ENCOUNTER — Ambulatory Visit
Admission: RE | Admit: 2020-04-22 | Discharge: 2020-04-22 | Disposition: A | Discharge status: Home / Self Care | Payer: Medicare HMO | Source: Ambulatory Visit | Attending: Family Medicine | Admitting: Family Medicine

## 2020-04-22 ENCOUNTER — Other Ambulatory Visit: Payer: Self-pay

## 2020-04-22 DIAGNOSIS — Z1231 Encounter for screening mammogram for malignant neoplasm of breast: Secondary | ICD-10-CM | POA: Insufficient documentation

## 2020-04-29 ENCOUNTER — Other Ambulatory Visit: Payer: Self-pay | Admitting: Family Medicine

## 2020-04-29 DIAGNOSIS — N6310 Unspecified lump in the right breast, unspecified quadrant: Secondary | ICD-10-CM

## 2020-04-29 DIAGNOSIS — R928 Other abnormal and inconclusive findings on diagnostic imaging of breast: Secondary | ICD-10-CM

## 2020-05-05 ENCOUNTER — Other Ambulatory Visit: Payer: Self-pay

## 2020-05-05 ENCOUNTER — Ambulatory Visit (INDEPENDENT_AMBULATORY_CARE_PROVIDER_SITE_OTHER): Payer: Medicare HMO | Admitting: Dermatology

## 2020-05-05 DIAGNOSIS — Z872 Personal history of diseases of the skin and subcutaneous tissue: Secondary | ICD-10-CM | POA: Diagnosis not present

## 2020-05-05 DIAGNOSIS — L578 Other skin changes due to chronic exposure to nonionizing radiation: Secondary | ICD-10-CM

## 2020-05-05 DIAGNOSIS — L719 Rosacea, unspecified: Secondary | ICD-10-CM

## 2020-05-05 NOTE — Progress Notes (Signed)
   Follow-Up Visit   Subjective  Rebecca Conner is a 69 y.o. female who presents for the following: Follow-up (Patient here today for 6 week AK follow up at the left cheek. ).  Patient advises the spot has resolved.  Patient was prescribed doxycycline 20mg  twice daily for rosacea and she could not tolerate due to heaviness at the chest. Patient took for 4 days then she stopped it and symptoms went away. She tried to restart but symptoms started again.   The following portions of the chart were reviewed this encounter and updated as appropriate:  Tobacco  Allergies  Meds  Problems  Med Hx  Surg Hx  Fam Hx      Review of Systems:  No other skin or systemic complaints except as noted in HPI or Assessment and Plan.  Objective  Well appearing patient in no apparent distress; mood and affect are within normal limits.  A focused examination was performed including face. Relevant physical exam findings are noted in the Assessment and Plan.  Objective  Face: Mid face erythema with scattered erythematous papules  Objective  Left Cheek: clear   Assessment & Plan  Rosacea Face  Chronic, flared with side effect of medication.  She did not tolerate doxycycline due to chest discomfort.  Start Soolantra at bedtime. Sample given. Patient will call for prescription if she confirms no reaction to it. Patient was not able to tolerate Skin [employer] triple cream, doxycycline or metronidazole.   Recommend daily broad spectrum sunscreen SPF 30+ to sun-exposed areas, reapply every 2 hours as needed.     History of actinic keratoses Left Cheek   Actinic Damage - diffuse scaly erythematous macules with underlying dyspigmentation - Recommend daily broad spectrum sunscreen SPF 30+ to sun-exposed areas, reapply every 2 hours as needed.  - Call for new or changing lesions.   Return for TBSE as scheduled.  Graciella Belton, RMA, am acting as scribe for Forest Gleason, MD  .  Documentation: I have reviewed the above documentation for accuracy and completeness, and I agree with the above.  Forest Gleason, MD

## 2020-05-05 NOTE — Patient Instructions (Signed)
Rosacea  What is rosacea? Rosacea (say: ro-zay-sha) is a common skin disease that usually begins as a trend of flushing or blushing easily.  As rosacea progresses, a persistent redness in the center of the face will develop and may gradually spread beyond the nose and cheeks to the forehead and chin.  In some cases, the ears, chest, and back could be affected.  Rosacea may appear as tiny blood vessels or small red bumps that occur in crops.  Frequently they can contain pus, and are called "pustules".  If the bumps do not contain pus, they are referred to as "papules".  Rarely, in prolonged, untreated cases of rosacea, the oil glands of the nose and cheeks may become permanently enlarged.  This is called rhinophyma, and is seen more frequently in men.  Signs and Risks In its beginning stages, rosacea tends to come and go, which makes it difficult to recognize.  It can start as intermittent flushing of the face.  Eventually, blood vessels may become permanently visible.  Pustules and papules can appear, but can be mistaken for adult acne.  People of all races, ages, genders and ethnic groups are at risk of developing rosacea.  However, it is more common in women (especially around menopause) and adults with fair skin between the ages of 30 and 50.  Treatment Dermatologists typically recommend a combination of treatments to effectively manage rosacea.  Treatment can improve symptoms and may stop the progression of the rosacea.  Treatment may involve both topical and oral medications.  The tetracycline antibiotics are often used for their anti-inflammatory effect; however, because of the possibility of developing antibiotic resistance, they should not be used long term at full dose.  For dilated blood vessels the options include electrodessication (uses electric current through a small needle), laser treatment, and cosmetics to hide the redness.   With all forms of treatment, improvement is a slow process, and  patients may not see any results for the first 3-4 weeks.  It is very important to avoid the sun and other triggers.  Patients must wear sunscreen daily.  Skin Care Instructions: 1. Cleanse the skin with a mild soap such as CeraVe cleanser, Cetaphil cleanser, or Dove soap once or twice daily as needed. 2. Moisturize with Eucerin Redness Relief Daily Perfecting Lotion (has a subtle green tint), CeraVe Moisturizing Cream, or Oil of Olay Daily Moisturizer with sunscreen every morning and/or night as recommended. 3. Makeup should be "non-comedogenic" (won't clog pores) and be labeled "for sensitive skin". Good choices for cosmetics are: Neutrogena, Almay, and Physician's Formula.  Any product with a green tint tends to offset a red complexion. 4. If your eyes are dry and irritated, use artificial tears 2-3 times per day and cleanse the eyelids daily with baby shampoo.  Have your eyes examined at least every 2 years.  Be sure to tell your eye doctor that you have rosacea. 5. Alcoholic beverages tend to cause flushing of the skin, and may make rosacea worse. 6. Always wear sunscreen, protect your skin from extreme hot and cold temperatures, and avoid spicy foods, hot drinks, and mechanical irritation such as rubbing, scrubbing, or massaging the face.  Avoid harsh skin cleansers, cleansing masks, astringents, and exfoliation. If a particular product burns or makes your face feel tight, then it is likely to flare your rosacea. 7. If you are having difficulty finding a sunscreen that you can tolerate, you may try switching to a chemical-free sunscreen.  These are ones whose active   ingredient is zinc oxide or titanium dioxide only.  They should also be fragrance free, non-comedogenic, and labeled for sensitive skin. 8. Rosacea triggers may vary from person to person.  There are a variety of foods that have been reported to trigger rosacea.  Some patients find that keeping a diary of what they were doing when they  flared helps them avoid triggers.  

## 2020-05-06 ENCOUNTER — Ambulatory Visit
Admission: RE | Admit: 2020-05-06 | Discharge: 2020-05-06 | Disposition: A | Discharge status: Home / Self Care | Payer: Medicare HMO | Source: Ambulatory Visit | Attending: Family Medicine | Admitting: Family Medicine

## 2020-05-06 DIAGNOSIS — N6310 Unspecified lump in the right breast, unspecified quadrant: Secondary | ICD-10-CM

## 2020-05-06 DIAGNOSIS — R928 Other abnormal and inconclusive findings on diagnostic imaging of breast: Secondary | ICD-10-CM | POA: Diagnosis present

## 2020-05-24 ENCOUNTER — Encounter: Payer: Self-pay | Admitting: Dermatology

## 2020-09-30 ENCOUNTER — Ambulatory Visit: Payer: Medicare HMO | Admitting: Dermatology

## 2020-09-30 ENCOUNTER — Other Ambulatory Visit: Payer: Self-pay

## 2020-09-30 DIAGNOSIS — L719 Rosacea, unspecified: Secondary | ICD-10-CM | POA: Diagnosis not present

## 2020-09-30 DIAGNOSIS — L821 Other seborrheic keratosis: Secondary | ICD-10-CM

## 2020-09-30 DIAGNOSIS — Z872 Personal history of diseases of the skin and subcutaneous tissue: Secondary | ICD-10-CM

## 2020-09-30 DIAGNOSIS — Z85828 Personal history of other malignant neoplasm of skin: Secondary | ICD-10-CM

## 2020-09-30 DIAGNOSIS — Z1283 Encounter for screening for malignant neoplasm of skin: Secondary | ICD-10-CM | POA: Diagnosis not present

## 2020-09-30 DIAGNOSIS — L918 Other hypertrophic disorders of the skin: Secondary | ICD-10-CM

## 2020-09-30 DIAGNOSIS — L578 Other skin changes due to chronic exposure to nonionizing radiation: Secondary | ICD-10-CM

## 2020-09-30 DIAGNOSIS — D1800 Hemangioma unspecified site: Secondary | ICD-10-CM

## 2020-09-30 DIAGNOSIS — D229 Melanocytic nevi, unspecified: Secondary | ICD-10-CM

## 2020-09-30 NOTE — Progress Notes (Signed)
Follow-Up Visit   Subjective  Rebecca Conner is a 70 y.o. female who presents for the following: tbse (Patient here today for tbse today. She has history of bcc. She also has a history of rosacea she is currently using hydrocoritsone. She reports that she is allergic to other medications that she has tried in past including sample of soolantra. She feels hydrocortisone is the only thing she can use.  ).  Patient here for full body skin exam and skin cancer screening.  The following portions of the chart were reviewed this encounter and updated as appropriate:  Tobacco  Allergies  Meds  Problems  Med Hx  Surg Hx  Fam Hx      Objective  Well appearing patient in no apparent distress; mood and affect are within normal limits.  A full examination was performed including scalp, head, eyes, ears, nose, lips, neck, chest, axillae, abdomen, back, buttocks, bilateral upper extremities, bilateral lower extremities, hands, feet, fingers, toes, fingernails, and toenails. All findings within normal limits unless otherwise noted below.  Objective  face: Mid face erythema with telangiectasias plus scattered inflammatory papules.   Assessment & Plan  Rosacea face  Chronic, flared with side effect of medication.    She did not tolerate doxycycline due to chest discomfort.   Patient unable to tolerate Soolantra, topical metronidazole or Skin medicinals triple cream (metronidazole, ivermectin, azelaic acid) due to dermatitis  Recommend stopping hydrocortisone cream as this can cause rosacea to worsen over time and cause atrophy of skin  Discussed prescription azelaic acid gel 15%. Deferred today, will try OTC first to confirm she tolerates it.  Start The Ordinary brand Azelaic acid 10 % gel apply twice daily to mid-face. Can purchase at [place]   Recommend daily broad spectrum sunscreen SPF 30+ to sun-exposed areas, reapply every 2 hours as needed.    Rosacea is a chronic  progressive skin condition usually affecting the face of adults, causing redness and/or acne bumps. It is treatable but not curable. It sometimes affects the eyes (ocular rosacea) as well. It may respond to topical and/or systemic medication and can flare with stress, sun exposure, alcohol, exercise and some foods.  Daily application of broad spectrum spf 30+ sunscreen to face is recommended to reduce flares.  Lentigines - Scattered tan macules - Due to sun exposure - Benign-appering, observe - Recommend daily broad spectrum sunscreen SPF 30+ to sun-exposed areas, reapply every 2 hours as needed. - Call for any changes  Seborrheic Keratoses - Stuck-on, waxy, tan-brown papules and plaques  - Discussed benign etiology and prognosis. - Observe - Call for any changes  Melanocytic Nevi - Tan-brown and/or pink-flesh-colored symmetric macules and papules - Benign appearing on exam today - Observation - Call clinic for new or changing moles - Recommend daily use of broad spectrum spf 30+ sunscreen to sun-exposed areas.   Hemangiomas - Red papules - Discussed benign nature - Observe - Call for any changes  Actinic Damage - Chronic, secondary to cumulative UV/sun exposure - diffuse scaly erythematous macules with underlying dyspigmentation - Recommend daily broad spectrum sunscreen SPF 30+ to sun-exposed areas, reapply every 2 hours as needed.  - Call for new or changing lesions.  History of PreCancerous Actinic Keratosis  Left cheek - site(s) of PreCancerous Actinic Keratosis clear today. - these may recur and new lesions may form requiring treatment to prevent transformation into skin cancer - observe for new or changing spots and contact [facility] for appointment  if occur - photoprotection with sun protective clothing; sunglasses and broad spectrum sunscreen with SPF of at least 30 + and frequent self skin exams recommended - yearly exams by a dermatologist recommended for  persons with history of PreCancerous Actinic Keratoses  History of Basal Cell Carcinoma of the Skin Right superior nasolabial fold  - No evidence of recurrence today - Recommend regular full body skin exams - Recommend daily broad spectrum sunscreen SPF 30+ to sun-exposed areas, reapply every 2 hours as needed.  - Call if any new or changing lesions are noted between office visits    Skin cancer screening performed today.  Return in about 6 months (around 04/02/2021) for tbse rosacea .  I, Ruthell Rummage, CMA, am acting as scribe for Forest Gleason, MD.  Documentation: I have reviewed the above documentation for accuracy and completeness, and I agree with the above.  Forest Gleason, MD

## 2020-09-30 NOTE — Patient Instructions (Addendum)
Rosacea  Recommend stopping hydrocortisone cream as this can cause rosacea and acne bumps  Start The Ordinary brand Azelaic acid 10 % gel apply twice daily to mid-face. Can purchase at [facility] or [place]   Melanoma ABCDEs  Melanoma is the most dangerous type of skin cancer, and is the leading cause of death from skin disease.  You are more likely to develop melanoma if you:  Have light-colored skin, light-colored eyes, or red or blond hair  Spend a lot of time in the sun  Tan regularly, either outdoors or in a tanning bed  Have had blistering sunburns, especially during childhood  Have a close family member who has had a melanoma  Have atypical moles or large birthmarks  Early detection of melanoma is key since treatment is typically straightforward and cure rates are extremely high if we catch it early.   The first sign of melanoma is often a change in a mole or a new dark spot.  The ABCDE system is a way of remembering the signs of melanoma.  A for asymmetry:  The two halves do not match. B for border:  The edges of the growth are irregular. C for color:  A mixture of colors are present instead of an even brown color. D for diameter:  Melanomas are usually (but not always) greater than 60mm - the size of a pencil eraser. E for evolution:  The spot keeps changing in size, shape, and color.  Please check your skin once per month between visits. You can use a small mirror in front and a large mirror behind you to keep an eye on the back side or your body.   If you see any new or changing lesions before your next follow-up, please call to schedule a visit.  Please continue daily skin protection including broad spectrum sunscreen SPF 30+ to sun-exposed areas, reapplying every 2 hours as needed when you're outdoors.

## 2020-10-03 ENCOUNTER — Encounter: Payer: Self-pay | Admitting: Dermatology

## 2021-04-14 ENCOUNTER — Other Ambulatory Visit: Payer: Self-pay

## 2021-04-14 ENCOUNTER — Ambulatory Visit: Payer: Medicare HMO | Admitting: Dermatology

## 2021-04-14 DIAGNOSIS — Z85828 Personal history of other malignant neoplasm of skin: Secondary | ICD-10-CM

## 2021-04-14 DIAGNOSIS — Z1283 Encounter for screening for malignant neoplasm of skin: Secondary | ICD-10-CM

## 2021-04-14 DIAGNOSIS — D1800 Hemangioma unspecified site: Secondary | ICD-10-CM

## 2021-04-14 DIAGNOSIS — L719 Rosacea, unspecified: Secondary | ICD-10-CM

## 2021-04-14 DIAGNOSIS — L814 Other melanin hyperpigmentation: Secondary | ICD-10-CM

## 2021-04-14 DIAGNOSIS — L570 Actinic keratosis: Secondary | ICD-10-CM

## 2021-04-14 DIAGNOSIS — D229 Melanocytic nevi, unspecified: Secondary | ICD-10-CM

## 2021-04-14 DIAGNOSIS — L578 Other skin changes due to chronic exposure to nonionizing radiation: Secondary | ICD-10-CM | POA: Diagnosis not present

## 2021-04-14 DIAGNOSIS — L821 Other seborrheic keratosis: Secondary | ICD-10-CM

## 2021-04-14 NOTE — Progress Notes (Signed)
Follow-Up Visit   Subjective  Rebecca Conner is a 70 y.o. female who presents for the following: Annual Exam (Hx BCC - patient has noticed an irregular crusted skin lesion on the R cheek that she would like checked today.). Patient has a hx of rosacea and was unable to tolerate Azelaic acid as it burned and caused erythema. She currently uses Cortisone 10 PRN, but hasn't had to use it lately. Patient's sister noticed a lesion on her back that she recommended having checked.   The following portions of the chart were reviewed this encounter and updated as appropriate:   Tobacco  Allergies  Meds  Problems  Med Hx  Surg Hx  Fam Hx      Review of Systems:  No other skin or systemic complaints except as noted in HPI or Assessment and Plan.  Objective  Well appearing patient in no apparent distress; mood and affect are within normal limits.  A full examination was performed including scalp, head, eyes, ears, nose, lips, neck, chest, axillae, abdomen, back, buttocks, bilateral upper extremities, bilateral lower extremities, hands, feet, fingers, toes, fingernails, and toenails. All findings within normal limits unless otherwise noted below.  Face Mid face erythema and scattered erythematous papules.  R shoulder x 1 Erythematous thin papules/macules with gritty scale.    Assessment & Plan  Rosacea Face  Chronic condition with duration or expected duration over one year. Condition is bothersome to patient. Currently flared and with side effects of medications.  Rosacea is a chronic progressive skin condition usually affecting the face of adults, causing redness and/or acne bumps. It is treatable but not curable. It sometimes affects the eyes (ocular rosacea) as well. It may respond to topical and/or systemic medication and can flare with stress, sun exposure, alcohol, exercise and some foods.  Daily application of broad spectrum spf 30+ sunscreen to face is recommended to reduce  flares.  Chronic, flared with side effect of medication.     She did not tolerate doxycycline due to chest discomfort.   Patient unable to tolerate Soolantra, topical metronidazole or Skin medicinals triple cream (metronidazole, ivermectin, azelaic acid) due to dermatitis   Recommend stopping hydrocortisone cream as this can cause rosacea to worsen over time and cause atrophy of skin  Start Zilxi foam QHS(samples given) and Avar cleanser QD (sample given). Advised patient do not use at the same time incase she is unable to tolerate them we will need to know which one it was.  AK (actinic keratosis) R shoulder x 1  Prior to procedure, discussed risks of blister formation, small wound, skin dyspigmentation, or rare scar following cryotherapy. Recommend Vaseline ointment to treated areas while healing.   Destruction of lesion - R shoulder x 1 Complexity: simple   Destruction method: cryotherapy   Informed consent: discussed and consent obtained   Timeout:  patient name, date of birth, surgical site, and procedure verified Lesion destroyed using liquid nitrogen: Yes   Region frozen until ice ball extended beyond lesion: Yes   Outcome: patient tolerated procedure well with no complications   Post-procedure details: wound care instructions given    Lentigines - Scattered tan macules - Due to sun exposure - Benign-appearing, observe - Recommend daily broad spectrum sunscreen SPF 30+ to sun-exposed areas, reapply every 2 hours as needed. - Call for any changes  Seborrheic Keratoses - Stuck-on, waxy, tan-brown papules and/or plaques  - Benign-appearing - Discussed benign etiology and prognosis. - Observe - Call for any changes  Melanocytic Nevi - Tan-brown and/or pink-flesh-colored symmetric macules and papules - Benign appearing on exam today - Observation - Call clinic for new or changing moles - Recommend daily use of broad spectrum spf 30+ sunscreen to sun-exposed areas.    Hemangiomas - Red papules - Discussed benign nature - Observe - Call for any changes  Actinic Damage - Chronic condition, secondary to cumulative UV/sun exposure - diffuse scaly erythematous macules with underlying dyspigmentation - Recommend daily broad spectrum sunscreen SPF 30+ to sun-exposed areas, reapply every 2 hours as needed.  - Staying in the shade or wearing long sleeves, sun glasses (UVA+UVB protection) and wide brim hats (4-inch brim around the entire circumference of the hat) are also recommended for sun protection.  - Call for new or changing lesions.  History of Basal Cell Carcinoma of the Skin - R sup nasolabial fold  - No evidence of recurrence today - Recommend regular full body skin exams - Recommend daily broad spectrum sunscreen SPF 30+ to sun-exposed areas, reapply every 2 hours as needed.  - Call if any new or changing lesions are noted between office visits  Skin cancer screening performed today.  Return in about 1 year (around 04/14/2022) for TBSE, AK and rosacea follow up in 6 mths.  Luther Redo, CMA, am acting as scribe for Forest Gleason, MD .  Documentation: I have reviewed the above documentation for accuracy and completeness, and I agree with the above.  Forest Gleason, MD

## 2021-04-14 NOTE — Patient Instructions (Addendum)
If you have any questions or concerns for your doctor, please call our main line at [phone] and press option 4 to reach your doctor's medical assistant. If no one answers, please leave a voicemail as directed and we will return your call as soon as possible. Messages left after 4 pm will be answered the following business day.   You may also send us a message via MyChart. We typically respond to MyChart messages within 1-2 business days.  For prescription refills, please ask your pharmacy to contact our office. Our fax number is [phone].  If you have an urgent issue when the clinic is closed that cannot wait until the next business day, you can page your doctor at the number below.    Please note that while we do our best to be available for urgent issues outside of office hours, we are not available 24/7.   If you have an urgent issue and are unable to reach us, you may choose to seek medical care at your doctor's office, retail clinic, urgent care center, or emergency room.  If you have a medical emergency, please immediately call 911 or go to the emergency department.  Pager Numbers  - Dr. Kowalski: [phone]  - Dr. Moye: [phone]  - Dr. Stewart: [phone]  In the event of inclement weather, please call our main line at [phone] for an update on the status of any delays or closures.  Dermatology Medication Tips: Please keep the boxes that topical medications come in in order to help keep track of the instructions about where and how to use these. Pharmacies typically print the medication instructions only on the boxes and not directly on the medication tubes.   If your medication is too expensive, please contact our office at [phone] option 4 or send us a message through MyChart.   We are unable to tell what your co-pay for medications will be in advance as this is different depending on your insurance coverage. However, we may be able to find a substitute  medication at lower cost or fill out paperwork to get insurance to cover a needed medication.   If a prior authorization is required to get your medication covered by your insurance company, please allow us 1-2 business days to complete this process.  Drug prices often vary depending on where the prescription is filled and some pharmacies may offer cheaper prices.  The website www.goodrx.com contains coupons for medications through different pharmacies. The prices here do not account for what the cost may be with help from insurance (it may be cheaper with your insurance), but the website can give you the price if you did not use any insurance.  - You can print the associated coupon and take it with your prescription to the pharmacy.  - You may also stop by our office during regular business hours and pick up a GoodRx coupon card.  - If you need your prescription sent electronically to a different pharmacy, notify our office through [facility] MyChart or by phone at [phone] option 4.  Recommend taking Heliocare sun protection supplement daily in sunny weather for additional sun protection. For maximum protection on the sunniest days, you can take up to 2 capsules of regular Heliocare OR take 1 capsule of Heliocare Ultra. For prolonged exposure (such as a full day in the sun), you can repeat your dose of the supplement 4 hours after your first dose. Heliocare can be purchased at [facility] or at [email or web address].    

## 2021-04-25 ENCOUNTER — Encounter: Payer: Self-pay | Admitting: Dermatology

## 2021-05-03 ENCOUNTER — Other Ambulatory Visit: Payer: Self-pay | Admitting: Family Medicine

## 2021-05-03 DIAGNOSIS — Z1231 Encounter for screening mammogram for malignant neoplasm of breast: Secondary | ICD-10-CM

## 2021-05-17 ENCOUNTER — Ambulatory Visit
Admission: RE | Admit: 2021-05-17 | Discharge: 2021-05-17 | Disposition: A | Discharge status: Home / Self Care | Payer: Medicare HMO | Source: Ambulatory Visit | Attending: Family Medicine | Admitting: Family Medicine

## 2021-05-17 ENCOUNTER — Other Ambulatory Visit: Payer: Self-pay

## 2021-05-17 DIAGNOSIS — Z1231 Encounter for screening mammogram for malignant neoplasm of breast: Secondary | ICD-10-CM | POA: Diagnosis not present

## 2021-10-12 ENCOUNTER — Ambulatory Visit: Payer: Medicare HMO | Admitting: Dermatology

## 2021-10-17 ENCOUNTER — Other Ambulatory Visit: Payer: Self-pay | Admitting: Family Medicine

## 2021-10-17 ENCOUNTER — Other Ambulatory Visit: Payer: Self-pay

## 2021-10-17 ENCOUNTER — Ambulatory Visit
Admission: RE | Admit: 2021-10-17 | Discharge: 2021-10-17 | Disposition: A | Discharge status: Home / Self Care | Payer: Medicare HMO | Source: Ambulatory Visit | Attending: Family Medicine | Admitting: Family Medicine

## 2021-10-17 DIAGNOSIS — M7989 Other specified soft tissue disorders: Secondary | ICD-10-CM | POA: Diagnosis present

## 2021-10-17 DIAGNOSIS — M79605 Pain in left leg: Secondary | ICD-10-CM | POA: Insufficient documentation

## 2021-11-16 ENCOUNTER — Ambulatory Visit: Payer: Medicare HMO | Admitting: Dermatology

## 2021-11-16 DIAGNOSIS — L814 Other melanin hyperpigmentation: Secondary | ICD-10-CM | POA: Diagnosis not present

## 2021-11-16 DIAGNOSIS — L820 Inflamed seborrheic keratosis: Secondary | ICD-10-CM | POA: Diagnosis not present

## 2021-11-16 DIAGNOSIS — L719 Rosacea, unspecified: Secondary | ICD-10-CM | POA: Diagnosis not present

## 2021-11-16 DIAGNOSIS — L578 Other skin changes due to chronic exposure to nonionizing radiation: Secondary | ICD-10-CM | POA: Diagnosis not present

## 2021-11-16 MED ORDER — FINACEA 15 % EX FOAM
1.0000 | CUTANEOUS | 3 refills | Status: DC
Start: 2021-11-16 — End: 2022-02-15

## 2021-11-16 NOTE — Patient Instructions (Signed)

## 2021-11-16 NOTE — Progress Notes (Signed)
? ?Follow-Up Visit ?  ?Subjective  ?Rebecca Conner is a 71 y.o. female who presents for the following: Rosacea (6 month follow up - she was given Zilxi and Avar cleanser last visit but she did not tolerate either one. She states she has not been able to tolerate anything so far other than Cortisone 10) and Other (Spot of right forearm that itches and gets irritated). ?The patient has spots, moles and lesions to be evaluated, some may be new or changing and the patient has concerns that these could be cancer. ? ?The following portions of the chart were reviewed this encounter and updated as appropriate:  ? Tobacco  Allergies  Meds  Problems  Med Hx  Surg Hx  Fam Hx   ?  ?Review of Systems:  No other skin or systemic complaints except as noted in HPI or Assessment and Plan. ? ?Objective  ?Well appearing patient in no apparent distress; mood and affect are within normal limits. ? ?A focused examination was performed including face. Relevant physical exam findings are noted in the Assessment and Plan. ? ?Face ?Erythema ? ? ? ? ?Right distal dorsum forearm ?Erythematous stuck-on, waxy papule or plaque ? ? ?Assessment & Plan  ?Rosacea ?Face ? ?Rosacea is a chronic progressive skin condition usually affecting the face of adults, causing redness and/or acne bumps. It is treatable but not curable. It sometimes affects the eyes (ocular rosacea) as well. It may respond to topical and/or systemic medication and can flare with stress, sun exposure, alcohol, exercise and some foods.  Daily application of broad spectrum spf 30+ sunscreen to face is recommended to reduce flares. ? ?Chronic condition with duration or expected duration over one year. Condition is bothersome to patient. Currently flared and with side effects of medications. ?  ?She did not tolerate doxycycline due to chest discomfort. May consider trying low dose (20 mg daily) in the future to see if it will improve condition but lessen side effect of  chest discomfort. ?  ?Patient unable to tolerate Soolantra, topical metronidazole or Skin medicinals triple cream (metronidazole, ivermectin, azelaic acid), Zilxi foam, Avar cleanser due to dermatitis ?  ?Recommend stopping hydrocortisone cream as this can cause rosacea to worsen over time and cause atrophy of skin ?  ?Discussed other treatment options - Finacea foam vs low dose doxycycline vs Skin Medicinals compound with oxymetazaline vs Mirvaso or Rhofade. ? ?Recommend Finacea foam qd-bid -sample given.  May fill this Rx if she can tolerate it. ? ?Azelaic Acid (FINACEA) 15 % FOAM - Face ?Apply 1 application. topically as directed. Once or twice daily ? ?Inflamed seborrheic keratosis ?Right distal dorsum forearm ?Destruction of lesion - Right distal dorsum forearm ?Complexity: simple   ?Destruction method: cryotherapy   ?Informed consent: discussed and consent obtained   ?Timeout:  patient name, date of birth, surgical site, and procedure verified ?Lesion destroyed using liquid nitrogen: Yes   ?Region frozen until ice ball extended beyond lesion: Yes   ?Outcome: patient tolerated procedure well with no complications   ?Post-procedure details: wound care instructions given   ? ?Actinic Damage ?- chronic, secondary to cumulative UV radiation exposure/sun exposure over time ?- diffuse scaly erythematous macules with underlying dyspigmentation ?- Recommend daily broad spectrum sunscreen SPF 30+ to sun-exposed areas, reapply every 2 hours as needed.  ?- Recommend staying in the shade or wearing long sleeves, sun glasses (UVA+UVB protection) and wide brim hats (4-inch brim around the entire circumference of the hat). ?- Call for new  or changing lesions. ? ?Lentigines ?- Scattered tan macules ?- Due to sun exposure ?- Benign-appering, observe ?- Recommend daily broad spectrum sunscreen SPF 30+ to sun-exposed areas, reapply every 2 hours as needed. ?- Call for any changes ? ?Return for 2-4 months , Follow up. ? ?I, Ashok Cordia, CMA, am acting as scribe for Sarina Ser, MD . ?Documentation: I have reviewed the above documentation for accuracy and completeness, and I agree with the above. ? ?Sarina Ser, MD ? ? ?

## 2021-11-25 ENCOUNTER — Encounter: Payer: Self-pay | Admitting: Dermatology

## 2022-02-15 ENCOUNTER — Ambulatory Visit: Payer: Medicare HMO | Admitting: Dermatology

## 2022-02-15 DIAGNOSIS — L259 Unspecified contact dermatitis, unspecified cause: Secondary | ICD-10-CM | POA: Diagnosis not present

## 2022-02-15 DIAGNOSIS — L821 Other seborrheic keratosis: Secondary | ICD-10-CM | POA: Diagnosis not present

## 2022-02-15 DIAGNOSIS — L719 Rosacea, unspecified: Secondary | ICD-10-CM

## 2022-02-15 DIAGNOSIS — Z872 Personal history of diseases of the skin and subcutaneous tissue: Secondary | ICD-10-CM | POA: Diagnosis not present

## 2022-02-15 NOTE — Progress Notes (Signed)
   Follow-Up Visit   Subjective  Rebecca Conner is a 71 y.o. female who presents for the following: Follow-up (3 months f/u right forearm hx of ISK ). Check face hx of Rosacea using otc Hydrocortisone 10 with a good response, pt has allergies to most Rosacea medications.  The patient has spots, moles and lesions to be evaluated, some may be new or changing and the patient has concerns that these could be cancer.  The following portions of the chart were reviewed this encounter and updated as appropriate:   Tobacco  Allergies  Meds  Problems  Med Hx  Surg Hx  Fam Hx     Review of Systems:  No other skin or systemic complaints except as noted in HPI or Assessment and Plan.  Objective  Well appearing patient in no apparent distress; mood and affect are within normal limits.  A focused examination was performed including face, right forearm. Relevant physical exam findings are noted in the Assessment and Plan.  face Mainly clear   Right Forearm - Anterior Clear skin    Assessment & Plan  Rosacea face  Rosacea is a chronic progressive skin condition usually affecting the face of adults, causing redness and/or acne bumps. It is treatable but not curable. It sometimes affects the eyes (ocular rosacea) as well. It may respond to topical and/or systemic medication and can flare with stress, sun exposure, alcohol, exercise and some foods.  Daily application of broad spectrum spf 30+ sunscreen to face is recommended to reduce flares.   She did not tolerate doxycycline due to chest discomfort.   Patient unable to tolerate Soolantra, topical metronidazole or Skin medicinals triple cream (metronidazole, ivermectin, azelaic acid), Zilxi foam, Avar cleanser, Finacea foam due to dermatitis  Recommend stopping hydrocortisone cream as this can cause rosacea to worsen over time and cause atrophy of skin  If she has recurrence of significant rosacea signs, we will discuss other treatment  options - Aczone?  Contact Dermatitis to above rosacea treatments - Consider patch testing with True Test x 36.  History of seborrheic keratosis Right Forearm - Anterior Reassured benign age-related growth.  Recommend observation.  Discussed cryotherapy if spot(s) become irritated or inflamed.   Seborrheic Keratoses - Stuck-on, waxy, tan-brown papules and/or plaques  - Benign-appearing - Discussed benign etiology and prognosis. - Observe - Call for any changes  Return if symptoms worsen or fail to improve.  IMarye Round, CMA, am acting as scribe for Sarina Ser, MD .  Documentation: I have reviewed the above documentation for accuracy and completeness, and I agree with the above.  Sarina Ser, MD

## 2022-02-15 NOTE — Patient Instructions (Signed)
Due to recent changes in healthcare laws, you may see results of your pathology and/or laboratory studies on MyChart before the doctors have had a chance to review them. We understand that in some cases there may be results that are confusing or concerning to you. Please understand that not all results are received at the same time and often the doctors may need to interpret multiple results in order to provide you with the best plan of care or course of treatment. Therefore, we ask that you please give us 2 business days to thoroughly review all your results before contacting the office for clarification. Should we see a critical lab result, you will be contacted sooner.   If You Need Anything After Your Visit  If you have any questions or concerns for your doctor, please call our main line at [phone] and press option 4 to reach your doctor's medical assistant. If no one answers, please leave a voicemail as directed and we will return your call as soon as possible. Messages left after 4 pm will be answered the following business day.   You may also send us a message via MyChart. We typically respond to MyChart messages within 1-2 business days.  For prescription refills, please ask your pharmacy to contact our office. Our fax number is [phone].  If you have an urgent issue when the clinic is closed that cannot wait until the next business day, you can page your doctor at the number below.    Please note that while we do our best to be available for urgent issues outside of office hours, we are not available 24/7.   If you have an urgent issue and are unable to reach us, you may choose to seek medical care at your doctor's office, retail clinic, urgent care center, or emergency room.  If you have a medical emergency, please immediately call 911 or go to the emergency department.  Pager Numbers  - Dr. Kowalski: [phone]  - Dr. Moye: [phone]  - Dr. Stewart:  [phone]  In the event of inclement weather, please call our main line at [phone] for an update on the status of any delays or closures.  Dermatology Medication Tips: Please keep the boxes that topical medications come in in order to help keep track of the instructions about where and how to use these. Pharmacies typically print the medication instructions only on the boxes and not directly on the medication tubes.   If your medication is too expensive, please contact our office at [phone] option 4 or send us a message through MyChart.   We are unable to tell what your co-pay for medications will be in advance as this is different depending on your insurance coverage. However, we may be able to find a substitute medication at lower cost or fill out paperwork to get insurance to cover a needed medication.   If a prior authorization is required to get your medication covered by your insurance company, please allow us 1-2 business days to complete this process.  Drug prices often vary depending on where the prescription is filled and some pharmacies may offer cheaper prices.  The website www.goodrx.com contains coupons for medications through different pharmacies. The prices here do not account for what the cost may be with help from insurance (it may be cheaper with your insurance), but the website can give you the price if you did not use any insurance.  - You can print the associated coupon and take it with   your prescription to the pharmacy.  - You may also stop by our office during regular business hours and pick up a GoodRx coupon card.  - If you need your prescription sent electronically to a different pharmacy, notify our office through [facility] MyChart or by phone at [phone] option 4.     Si Usted Necesita Algo Despus de Su Visita  Tambin puede enviarnos un mensaje a travs de MyChart. Por lo general respondemos a los mensajes de MyChart en el transcurso de 1 a 2  das hbiles.  Para renovar recetas, por favor pida a su farmacia que se ponga en contacto con nuestra oficina. Nuestro nmero de fax es el [phone].  Si tiene un asunto urgente cuando la clnica est cerrada y que no puede esperar hasta el siguiente da hbil, puede llamar/localizar a su doctor(a) al nmero que aparece a continuacin.   Por favor, tenga en cuenta que aunque hacemos todo lo posible para estar disponibles para asuntos urgentes fuera del horario de oficina, no estamos disponibles las 24 horas del da, los 7 das de la semana.   Si tiene un problema urgente y no puede comunicarse con nosotros, puede optar por buscar atencin mdica  en el consultorio de su doctor(a), en una clnica privada, en un centro de atencin urgente o en una sala de emergencias.  Si tiene una emergencia mdica, por favor llame inmediatamente al 911 o vaya a la sala de emergencias.  Nmeros de bper  - Dr. Kowalski: [phone]  - Dra. Moye: [phone]  - Dra. Stewart: [phone]  En caso de inclemencias del tiempo, por favor llame a nuestra lnea principal al [phone] para una actualizacin sobre el estado de cualquier retraso o cierre.  Consejos para la medicacin en dermatologa: Por favor, guarde las cajas en las que vienen los medicamentos de uso tpico para ayudarle a seguir las instrucciones sobre dnde y cmo usarlos. Las farmacias generalmente imprimen las instrucciones del medicamento slo en las cajas y no directamente en los tubos del medicamento.   Si su medicamento es muy caro, por favor, pngase en contacto con nuestra oficina llamando al [phone] y presione la opcin 4 o envenos un mensaje a travs de MyChart.   No podemos decirle cul ser su copago por los medicamentos por adelantado ya que esto es diferente dependiendo de la cobertura de su seguro. Sin embargo, es posible que podamos encontrar un medicamento sustituto a menor costo o llenar un formulario para que el  seguro cubra el medicamento que se considera necesario.   Si se requiere una autorizacin previa para que su compaa de seguros cubra su medicamento, por favor permtanos de 1 a 2 das hbiles para completar este proceso.  Los precios de los medicamentos varan con frecuencia dependiendo del lugar de dnde se surte la receta y alguna farmacias pueden ofrecer precios ms baratos.  El sitio web www.goodrx.com tiene cupones para medicamentos de diferentes farmacias. Los precios aqu no tienen en cuenta lo que podra costar con la ayuda del seguro (puede ser ms barato con su seguro), pero el sitio web puede darle el precio si no utiliz ningn seguro.  - Puede imprimir el cupn correspondiente y llevarlo con su receta a la farmacia.  - Tambin puede pasar por nuestra oficina durante el horario de atencin regular y recoger una tarjeta de cupones de GoodRx.  - Si necesita que su receta se enve electrnicamente a una farmacia diferente, informe a nuestra oficina a travs de MyChart de [facility]   o por telfono llamando al [phone] y presione la opcin 4.  

## 2022-02-25 ENCOUNTER — Encounter: Payer: Self-pay | Admitting: Dermatology

## 2022-04-20 ENCOUNTER — Encounter: Payer: Medicare HMO | Admitting: Dermatology

## 2022-06-20 ENCOUNTER — Ambulatory Visit: Payer: Medicare HMO | Admitting: Dermatology

## 2022-06-21 ENCOUNTER — Encounter: Payer: Medicare HMO | Admitting: Dermatology

## 2022-07-06 ENCOUNTER — Ambulatory Visit: Payer: Medicare HMO | Admitting: Dermatology

## 2022-07-06 DIAGNOSIS — L570 Actinic keratosis: Secondary | ICD-10-CM

## 2022-07-06 DIAGNOSIS — W57XXXA Bitten or stung by nonvenomous insect and other nonvenomous arthropods, initial encounter: Secondary | ICD-10-CM

## 2022-07-06 DIAGNOSIS — L720 Epidermal cyst: Secondary | ICD-10-CM | POA: Diagnosis not present

## 2022-07-06 DIAGNOSIS — L918 Other hypertrophic disorders of the skin: Secondary | ICD-10-CM

## 2022-07-06 DIAGNOSIS — L578 Other skin changes due to chronic exposure to nonionizing radiation: Secondary | ICD-10-CM

## 2022-07-06 DIAGNOSIS — L814 Other melanin hyperpigmentation: Secondary | ICD-10-CM

## 2022-07-06 DIAGNOSIS — Z1283 Encounter for screening for malignant neoplasm of skin: Secondary | ICD-10-CM

## 2022-07-06 DIAGNOSIS — D229 Melanocytic nevi, unspecified: Secondary | ICD-10-CM

## 2022-07-06 DIAGNOSIS — Z85828 Personal history of other malignant neoplasm of skin: Secondary | ICD-10-CM | POA: Diagnosis not present

## 2022-07-06 DIAGNOSIS — L821 Other seborrheic keratosis: Secondary | ICD-10-CM

## 2022-07-06 MED ORDER — CLOBETASOL PROPIONATE 0.05 % EX OINT
1.0000 | TOPICAL_OINTMENT | Freq: Two times a day (BID) | CUTANEOUS | 0 refills | Status: AC
Start: 2022-07-06 — End: ?

## 2022-07-06 NOTE — Patient Instructions (Addendum)
Recommend taking Heliocare sun protection supplement daily in sunny weather for additional sun protection. For maximum protection on the sunniest days, you can take up to 2 capsules of regular Heliocare OR take 1 capsule of Heliocare Ultra. For prolonged exposure (such as a full day in the sun), you can repeat your dose of the supplement 4 hours after your first dose. Heliocare can be purchased at [facility], at some [facility] or at www.[email or web address].    Cryotherapy Aftercare  Wash gently with soap and water everyday.   Apply Vaseline and Band-Aid daily until healed.    Melanoma ABCDEs  Melanoma is the most dangerous type of skin cancer, and is the leading cause of death from skin disease.  You are more likely to develop melanoma if you: Have light-colored skin, light-colored eyes, or red or blond hair Spend a lot of time in the sun Tan regularly, either outdoors or in a tanning bed Have had blistering sunburns, especially during childhood Have a close family member who has had a melanoma Have atypical moles or large birthmarks  Early detection of melanoma is key since treatment is typically straightforward and cure rates are extremely high if we catch it early.   The first sign of melanoma is often a change in a mole or a new dark spot.  The ABCDE system is a way of remembering the signs of melanoma.  A for asymmetry:  The two halves do not match. B for border:  The edges of the growth are irregular. C for color:  A mixture of colors are present instead of an even brown color. D for diameter:  Melanomas are usually (but not always) greater than 6mm - the size of a pencil eraser. E for evolution:  The spot keeps changing in size, shape, and color.  Please check your skin once per month between visits. You can use a small mirror in front and a large mirror behind you to keep an eye on the back side or your body.   If you see any new or changing lesions before your next  follow-up, please call to schedule a visit.  Please continue daily skin protection including broad spectrum sunscreen SPF 30+ to sun-exposed areas, reapplying every 2 hours as needed when you're outdoors.    Due to recent changes in healthcare laws, you may see results of your pathology and/or laboratory studies on MyChart before the doctors have had a chance to review them. We understand that in some cases there may be results that are confusing or concerning to you. Please understand that not all results are received at the same time and often the doctors may need to interpret multiple results in order to provide you with the best plan of care or course of treatment. Therefore, we ask that you please give us 2 business days to thoroughly review all your results before contacting the office for clarification. Should we see a critical lab result, you will be contacted sooner.   If You Need Anything After Your Visit  If you have any questions or concerns for your doctor, please call our main line at [phone] and press option 4 to reach your doctor's medical assistant. If no one answers, please leave a voicemail as directed and we will return your call as soon as possible. Messages left after 4 pm will be answered the following business day.   You may also send us a message via MyChart. We typically respond to MyChart messages within 1-2 business   days.  For prescription refills, please ask your pharmacy to contact our office. Our fax number is [phone].  If you have an urgent issue when the clinic is closed that cannot wait until the next business day, you can page your doctor at the number below.    Please note that while we do our best to be available for urgent issues outside of office hours, we are not available 24/7.   If you have an urgent issue and are unable to reach us, you may choose to seek medical care at your doctor's office, retail clinic, urgent care center, or emergency  room.  If you have a medical emergency, please immediately call 911 or go to the emergency department.  Pager Numbers  - Dr. Kowalski: [phone]  - Dr. Moye: [phone]  - Dr. Stewart: [phone]  In the event of inclement weather, please call our main line at [phone] for an update on the status of any delays or closures.  Dermatology Medication Tips: Please keep the boxes that topical medications come in in order to help keep track of the instructions about where and how to use these. Pharmacies typically print the medication instructions only on the boxes and not directly on the medication tubes.   If your medication is too expensive, please contact our office at [phone] option 4 or send us a message through MyChart.   We are unable to tell what your co-pay for medications will be in advance as this is different depending on your insurance coverage. However, we may be able to find a substitute medication at lower cost or fill out paperwork to get insurance to cover a needed medication.   If a prior authorization is required to get your medication covered by your insurance company, please allow us 1-2 business days to complete this process.  Drug prices often vary depending on where the prescription is filled and some pharmacies may offer cheaper prices.  The website www.goodrx.com contains coupons for medications through different pharmacies. The prices here do not account for what the cost may be with help from insurance (it may be cheaper with your insurance), but the website can give you the price if you did not use any insurance.  - You can print the associated coupon and take it with your prescription to the pharmacy.  - You may also stop by our office during regular business hours and pick up a GoodRx coupon card.  - If you need your prescription sent electronically to a different pharmacy, notify our office through [facility] MyChart or by phone at [phone]  option 4.     Si Usted Necesita Algo Despus de Su Visita  Tambin puede enviarnos un mensaje a travs de MyChart. Por lo general respondemos a los mensajes de MyChart en el transcurso de 1 a 2 das hbiles.  Para renovar recetas, por favor pida a su farmacia que se ponga en contacto con nuestra oficina. Nuestro nmero de fax es el [phone].  Si tiene un asunto urgente cuando la clnica est cerrada y que no puede esperar hasta el siguiente da hbil, puede llamar/localizar a su doctor(a) al nmero que aparece a continuacin.   Por favor, tenga en cuenta que aunque hacemos todo lo posible para estar disponibles para asuntos urgentes fuera del horario de oficina, no estamos disponibles las 24 horas del da, los 7 das de la semana.   Si tiene un problema urgente y no puede comunicarse con nosotros, puede optar por buscar atencin mdica    en el consultorio de su doctor(a), en una clnica privada, en un centro de atencin urgente o en una sala de emergencias.  Si tiene una emergencia mdica, por favor llame inmediatamente al 911 o vaya a la sala de emergencias.  Nmeros de bper  - Dr. Kowalski: [phone]  - Dra. Moye: [phone]  - Dra. Stewart: [phone]  En caso de inclemencias del tiempo, por favor llame a nuestra lnea principal al [phone] para una actualizacin sobre el estado de cualquier retraso o cierre.  Consejos para la medicacin en dermatologa: Por favor, guarde las cajas en las que vienen los medicamentos de uso tpico para ayudarle a seguir las instrucciones sobre dnde y cmo usarlos. Las farmacias generalmente imprimen las instrucciones del medicamento slo en las cajas y no directamente en los tubos del medicamento.   Si su medicamento es muy caro, por favor, pngase en contacto con nuestra oficina llamando al [phone] y presione la opcin 4 o envenos un mensaje a travs de MyChart.   No podemos decirle cul ser su copago por los medicamentos  por adelantado ya que esto es diferente dependiendo de la cobertura de su seguro. Sin embargo, es posible que podamos encontrar un medicamento sustituto a menor costo o llenar un formulario para que el seguro cubra el medicamento que se considera necesario.   Si se requiere una autorizacin previa para que su compaa de seguros cubra su medicamento, por favor permtanos de 1 a 2 das hbiles para completar este proceso.  Los precios de los medicamentos varan con frecuencia dependiendo del lugar de dnde se surte la receta y alguna farmacias pueden ofrecer precios ms baratos.  El sitio web www.goodrx.com tiene cupones para medicamentos de diferentes farmacias. Los precios aqu no tienen en cuenta lo que podra costar con la ayuda del seguro (puede ser ms barato con su seguro), pero el sitio web puede darle el precio si no utiliz ningn seguro.  - Puede imprimir el cupn correspondiente y llevarlo con su receta a la farmacia.  - Tambin puede pasar por nuestra oficina durante el horario de atencin regular y recoger una tarjeta de cupones de GoodRx.  - Si necesita que su receta se enve electrnicamente a una farmacia diferente, informe a nuestra oficina a travs de MyChart de [facility] o por telfono llamando al [phone] y presione la opcin 4.  

## 2022-07-06 NOTE — Progress Notes (Signed)
Follow-Up Visit   Subjective  Rebecca Conner is a 71 y.o. female who presents for the following: FBSE (Hx BCC. Patient does have a spot at right under arm that was light colored and has gotten darker. Not itchy or tender, no bleeding. ).  The patient presents for Total-Body Skin Exam (TBSE) for skin cancer screening and mole check.  The patient has spots, moles and lesions to be evaluated, some may be new or changing and the patient has concerns that these could be cancer.  Patient got a bite at left foot near her ankle last week, did have some swelling.   The following portions of the chart were reviewed this encounter and updated as appropriate:   Tobacco  Allergies  Meds  Problems  Med Hx  Surg Hx  Fam Hx      Review of Systems:  No other skin or systemic complaints except as noted in HPI or Assessment and Plan.  Objective  Well appearing patient in no apparent distress; mood and affect are within normal limits.  A full examination was performed including scalp, head, eyes, ears, nose, lips, neck, chest, axillae, abdomen, back, buttocks, bilateral upper extremities, bilateral lower extremities, hands, feet, fingers, toes, fingernails, and toenails. All findings within normal limits unless otherwise noted below.  Left Upper Back Subcutaneous nodule.   Left Ankle 2 edematous pink round plaques  Left Cheek Erythematous thin papules/macules with gritty scale.     Assessment & Plan  Epidermal inclusion cyst Left Upper Back  Benign-appearing. Exam most consistent with an epidermal inclusion cyst. Discussed that a cyst is a benign growth that can grow over time and sometimes get irritated or inflamed. Recommend observation if it is not bothersome. Discussed option of surgical excision to remove it if it is growing, symptomatic, or other changes noted. Please call for new or changing lesions so they can be evaluated.    Reaction to insect bite Left Ankle  Recommend  cleaning with Hibaclens. Start clobetasol 0.05% ointment twice daily for up to 2 weeks at affected area left foot. Avoid applying to face, groin, and axilla. Use as directed. Long-term use can cause thinning of the skin.  Topical steroids (such as triamcinolone, fluocinolone, fluocinonide, mometasone, clobetasol, halobetasol, betamethasone, hydrocortisone) can cause thinning and lightening of the skin if they are used for too long in the same area. Your physician has selected the right strength medicine for your problem and area affected on the body. Please use your medication only as directed by your physician to prevent side effects.    clobetasol ointment (TEMOVATE) 0.05 % - Left Ankle Apply 1 Application topically 2 (two) times daily.  AK (actinic keratosis) Left Cheek  Actinic keratoses are precancerous spots that appear secondary to cumulative UV radiation exposure/sun exposure over time. They are chronic with expected duration over 1 year. A portion of actinic keratoses will progress to squamous cell carcinoma of the skin. It is not possible to reliably predict which spots will progress to skin cancer and so treatment is recommended to prevent development of skin cancer.  Recommend daily broad spectrum sunscreen SPF 30+ to sun-exposed areas, reapply every 2 hours as needed.  Recommend staying in the shade or wearing long sleeves, sun glasses (UVA+UVB protection) and wide brim hats (4-inch brim around the entire circumference of the hat). Call for new or changing lesions.  Prior to procedure, discussed risks of blister formation, small wound, skin dyspigmentation, or rare scar following cryotherapy. Recommend Vaseline ointment to  treated areas while healing.   Destruction of lesion - Left Cheek  Destruction method: cryotherapy   Informed consent: discussed and consent obtained   Lesion destroyed using liquid nitrogen: Yes   Cryotherapy cycles:  2 Outcome: patient tolerated procedure  well with no complications   Post-procedure details: wound care instructions given     History of Basal Cell Carcinoma of the Skin - No evidence of recurrence today - Recommend regular full body skin exams - Recommend daily broad spectrum sunscreen SPF 30+ to sun-exposed areas, reapply every 2 hours as needed.  - Call if any new or changing lesions are noted between office visits  Lentigines - Scattered tan macules - Due to sun exposure - Benign-appearing, observe - Recommend daily broad spectrum sunscreen SPF 30+ to sun-exposed areas, reapply every 2 hours as needed. - Call for any changes  Seborrheic Keratoses - Stuck-on, waxy, tan-brown papules and/or plaques  - Benign-appearing - Discussed benign etiology and prognosis. - Observe - Call for any changes  Melanocytic Nevi - Tan-brown and/or pink-flesh-colored symmetric macules and papules - Benign appearing on exam today - Observation - Call clinic for new or changing moles - Recommend daily use of broad spectrum spf 30+ sunscreen to sun-exposed areas.   Hemangiomas - Red papules - Discussed benign nature - Observe - Call for any changes  Actinic Damage - Chronic condition, secondary to cumulative UV/sun exposure - diffuse scaly erythematous macules with underlying dyspigmentation - Recommend daily broad spectrum sunscreen SPF 30+ to sun-exposed areas, reapply every 2 hours as needed.  - Staying in the shade or wearing long sleeves, sun glasses (UVA+UVB protection) and wide brim hats (4-inch brim around the entire circumference of the hat) are also recommended for sun protection.  - Call for new or changing lesions.  Skin cancer screening performed today.  Acrochordons (Skin Tags) - Fleshy, skin-colored pedunculated papules - Benign appearing.  - Observe. - If desired, they can be removed with an in office procedure that is not covered by insurance. - Please call the clinic if you notice any new or changing  lesions.  Return in about 1 year (around 07/07/2023) for TBSE, Hx BCC.  Graciella Belton, RMA, am acting as scribe for Forest Gleason, MD .   Documentation: I have reviewed the above documentation for accuracy and completeness, and I agree with the above.  Forest Gleason, MD

## 2022-07-19 ENCOUNTER — Encounter: Payer: Self-pay | Admitting: Dermatology

## 2022-09-05 ENCOUNTER — Observation Stay
Admission: EM | Admit: 2022-09-05 | Discharge: 2022-09-06 | Disposition: A | Discharge status: Home / Self Care | Payer: Medicare HMO | Attending: Internal Medicine | Admitting: Internal Medicine

## 2022-09-05 ENCOUNTER — Emergency Department: Payer: Medicare HMO

## 2022-09-05 ENCOUNTER — Other Ambulatory Visit: Payer: Self-pay

## 2022-09-05 ENCOUNTER — Observation Stay: Payer: Medicare HMO

## 2022-09-05 ENCOUNTER — Encounter: Payer: Self-pay | Admitting: Internal Medicine

## 2022-09-05 DIAGNOSIS — Z23 Encounter for immunization: Secondary | ICD-10-CM | POA: Insufficient documentation

## 2022-09-05 DIAGNOSIS — Z87891 Personal history of nicotine dependence: Secondary | ICD-10-CM | POA: Insufficient documentation

## 2022-09-05 DIAGNOSIS — R2990 Unspecified symptoms and signs involving the nervous system: Secondary | ICD-10-CM

## 2022-09-05 DIAGNOSIS — Z79899 Other long term (current) drug therapy: Secondary | ICD-10-CM | POA: Insufficient documentation

## 2022-09-05 DIAGNOSIS — I674 Hypertensive encephalopathy: Secondary | ICD-10-CM | POA: Insufficient documentation

## 2022-09-05 DIAGNOSIS — G459 Transient cerebral ischemic attack, unspecified: Principal | ICD-10-CM | POA: Diagnosis present

## 2022-09-05 DIAGNOSIS — Z7984 Long term (current) use of oral hypoglycemic drugs: Secondary | ICD-10-CM | POA: Insufficient documentation

## 2022-09-05 DIAGNOSIS — I10 Essential (primary) hypertension: Secondary | ICD-10-CM | POA: Diagnosis not present

## 2022-09-05 DIAGNOSIS — E119 Type 2 diabetes mellitus without complications: Secondary | ICD-10-CM | POA: Diagnosis not present

## 2022-09-05 DIAGNOSIS — Z7982 Long term (current) use of aspirin: Secondary | ICD-10-CM | POA: Diagnosis not present

## 2022-09-05 DIAGNOSIS — E039 Hypothyroidism, unspecified: Secondary | ICD-10-CM | POA: Diagnosis not present

## 2022-09-05 DIAGNOSIS — Z85828 Personal history of other malignant neoplasm of skin: Secondary | ICD-10-CM | POA: Insufficient documentation

## 2022-09-05 DIAGNOSIS — R29818 Other symptoms and signs involving the nervous system: Secondary | ICD-10-CM | POA: Diagnosis present

## 2022-09-05 LAB — COMPREHENSIVE METABOLIC PANEL
ALT: 20 U/L (ref 0–44)
AST: 21 U/L (ref 15–41)
Albumin: 3.8 g/dL (ref 3.5–5.0)
Alkaline Phosphatase: 90 U/L (ref 38–126)
Anion gap: 11 (ref 5–15)
BUN: 16 mg/dL (ref 8–23)
CO2: 24 mmol/L (ref 22–32)
Calcium: 8.5 mg/dL — ABNORMAL LOW (ref 8.9–10.3)
Chloride: 105 mmol/L (ref 98–111)
Creatinine, Ser: 0.76 mg/dL (ref 0.44–1.00)
GFR, Estimated: >60 mL/min (ref >60)
Glucose, Bld: 123 mg/dL — ABNORMAL HIGH (ref 70–99)
Potassium: 3.7 mmol/L (ref 3.5–5.1)
Sodium: 140 mmol/L (ref 135–145)
Total Bilirubin: 0.8 mg/dL (ref 0.3–1.2)
Total Protein: 7.1 g/dL (ref 6.5–8.1)

## 2022-09-05 LAB — DIFFERENTIAL
Abs Immature Granulocytes: 0.02 10*3/uL (ref 0.00–0.07)
Basophils Absolute: 0.1 10*3/uL (ref 0.0–0.1)
Basophils Relative: 1 %
Eosinophils Absolute: 0.1 10*3/uL (ref 0.0–0.5)
Eosinophils Relative: 1 %
Immature Granulocytes: 0 %
Lymphocytes Relative: 28 %
Lymphs Abs: 1.9 10*3/uL (ref 0.7–4.0)
Monocytes Absolute: 0.4 10*3/uL (ref 0.1–1.0)
Monocytes Relative: 6 %
Neutro Abs: 4.4 10*3/uL (ref 1.7–7.7)
Neutrophils Relative %: 64 %

## 2022-09-05 LAB — HEMOGLOBIN A1C
Hgb A1c MFr Bld: 6.6 % — ABNORMAL HIGH (ref 4.8–5.6)
Mean Plasma Glucose: 142.72 mg/dL

## 2022-09-05 LAB — CBC
HCT: 42.5 % (ref 36.0–46.0)
Hemoglobin: 13.6 g/dL (ref 12.0–15.0)
MCH: 27.5 pg (ref 26.0–34.0)
MCHC: 32 g/dL (ref 30.0–36.0)
MCV: 86 fL (ref 80.0–100.0)
Platelets: 336 10*3/uL (ref 150–400)
RBC: 4.94 MIL/uL (ref 3.87–5.11)
RDW: 13.9 % (ref 11.5–15.5)
WBC: 6.9 10*3/uL (ref 4.0–10.5)
nRBC: 0 % (ref 0.0–0.2)

## 2022-09-05 LAB — PROTIME-INR
INR: 1 (ref 0.8–1.2)
Prothrombin Time: 13.1 seconds (ref 11.4–15.2)

## 2022-09-05 LAB — GLUCOSE, CAPILLARY
Glucose-Capillary: 108 mg/dL — ABNORMAL HIGH (ref 70–99)
Glucose-Capillary: 127 mg/dL — ABNORMAL HIGH (ref 70–99)

## 2022-09-05 LAB — APTT: aPTT: 29 seconds (ref 24–36)

## 2022-09-05 LAB — ETHANOL: Alcohol, Ethyl (B): <10 mg/dL (ref <10)

## 2022-09-05 LAB — CBG MONITORING, ED: Glucose-Capillary: 140 mg/dL — ABNORMAL HIGH (ref 70–99)

## 2022-09-05 LAB — TSH: TSH: 1.33 u[IU]/mL (ref 0.350–4.500)

## 2022-09-05 MED ORDER — EZETIMIBE 10 MG PO TABS
10.0000 mg | ORAL_TABLET | Freq: Every evening | ORAL | Status: DC
  Administered 2022-09-05: 10 mg via ORAL
  Filled 2022-09-05 (×2): qty 1

## 2022-09-05 MED ORDER — PANTOPRAZOLE SODIUM 20 MG PO TBEC
20.0000 mg | DELAYED_RELEASE_TABLET | Freq: Every day | ORAL | Status: DC
  Administered 2022-09-05: 20 mg via ORAL
  Filled 2022-09-05: qty 1

## 2022-09-05 MED ORDER — SODIUM CHLORIDE 0.9% FLUSH: 3.0000 mL | Freq: Once | INTRAVENOUS | Status: DC

## 2022-09-05 MED ORDER — HYDRALAZINE HCL 20 MG/ML IJ SOLN: 10.0000 mg | Freq: Four times a day (QID) | INTRAMUSCULAR | Status: DC | PRN

## 2022-09-05 MED ORDER — ASPIRIN 325 MG PO TBEC
325.0000 mg | DELAYED_RELEASE_TABLET | Freq: Once | ORAL | Status: AC
  Administered 2022-09-05: 325 mg via ORAL
  Filled 2022-09-05: qty 1

## 2022-09-05 MED ORDER — LEVOTHYROXINE SODIUM 112 MCG PO TABS
112.0000 ug | ORAL_TABLET | Freq: Every day | ORAL | Status: DC
  Administered 2022-09-06: 112 ug via ORAL
  Filled 2022-09-05: qty 1

## 2022-09-05 MED ORDER — INSULIN ASPART 100 UNIT/ML IJ SOLN
0.0000 [IU] | Freq: Three times a day (TID) | INTRAMUSCULAR | Status: DC
  Administered 2022-09-06 (×2): 1 [IU] via SUBCUTANEOUS
  Filled 2022-09-05 (×2): qty 1

## 2022-09-05 MED ORDER — CLEVIDIPINE BUTYRATE 0.5 MG/ML IV EMUL
0.0000 mg/h | INTRAVENOUS | Status: DC
  Administered 2022-09-05: 2 mg/h via INTRAVENOUS

## 2022-09-05 MED ORDER — INFLUENZA VAC A&B SA ADJ QUAD 0.5 ML IM PRSY
0.5000 mL | PREFILLED_SYRINGE | INTRAMUSCULAR | Status: AC
  Administered 2022-09-06: 0.5 mL via INTRAMUSCULAR
  Filled 2022-09-05 (×2): qty 0.5

## 2022-09-05 MED ORDER — ALBUTEROL SULFATE (2.5 MG/3ML) 0.083% IN NEBU
2.5000 mg | INHALATION_SOLUTION | Freq: Four times a day (QID) | RESPIRATORY_TRACT | Status: DC
  Administered 2022-09-05: 2.5 mg via RESPIRATORY_TRACT
  Filled 2022-09-05: qty 3

## 2022-09-05 MED ORDER — ACETAMINOPHEN 325 MG PO TABS: 650.0000 mg | ORAL_TABLET | Freq: Four times a day (QID) | ORAL | Status: DC | PRN

## 2022-09-05 MED ORDER — LABETALOL HCL 5 MG/ML IV SOLN
10.0000 mg | Freq: Once | INTRAVENOUS | Status: AC
  Administered 2022-09-05: 10 mg via INTRAVENOUS

## 2022-09-05 MED ORDER — ENOXAPARIN SODIUM 60 MG/0.6ML IJ SOSY
0.5000 mg/kg | PREFILLED_SYRINGE | INTRAMUSCULAR | Status: DC
  Administered 2022-09-06: 45 mg via SUBCUTANEOUS
  Filled 2022-09-05: qty 0.6

## 2022-09-05 MED ORDER — ACETAMINOPHEN 650 MG RE SUPP: 650.0000 mg | Freq: Four times a day (QID) | RECTAL | Status: DC | PRN

## 2022-09-05 MED ORDER — INSULIN ASPART 100 UNIT/ML IJ SOLN: 0.0000 [IU] | Freq: Every day | INTRAMUSCULAR | Status: DC

## 2022-09-05 NOTE — Progress Notes (Signed)
   09/05/22 1300  Spiritual Encounters  Type of Visit Initial  Care provided to: Pt not available  Conversation partners present during encounter Nurse  Referral source Code page  Reason for visit Code  OnCall Visit Yes   Melven Sartorius responded to code stroke. Pt in [facility] lab, no family at the moment.  Call chap if need arise.

## 2022-09-05 NOTE — Progress Notes (Signed)
Code stroke cart activated at 1241. Pt in CT. Dr Cheral Marker paged at 1243. Dr Cheral Marker in CT to assess pt at 1247.   Linden Dolin, Tele Stroke RN

## 2022-09-05 NOTE — ED Notes (Signed)
Dr. Jori Moll aware of pts BP of 139/67 and that the pt is having left eye pain and a headache. Pts dizziness has subsided at this time. No neuro changes. All other vitals stable, pt alert and oriented conversing with family at bedside.

## 2022-09-05 NOTE — Progress Notes (Signed)
SBP parameter changed to <180 per MD Lindzen.

## 2022-09-05 NOTE — H&P (Signed)
[facility] History and Physical  Rebecca Conner BOF:[id number] DOB: 1951-04-13 DOA: 09/05/2022 PCP: Dion Body, MD   Admitted from: Home Chief Complaint: Left-sided face, arm and leg numbness  History of Present Illness: Rebecca Conner is a 72 y.o. female with PMH significant for DM2, HTN, HLD, Bell's palsy, GERD who presented to the ED today with complaint of strokelike symptoms.  Patient complained of mild headache for last few days. This morning, patient woke up fine but then started to have left-sided face, arm and leg numbness around 9:30 AM.  She presented to the 3 hours later ED on 12:30 PM.  Not on any blood thinners. She endorses inconsistent compliance to losartan but she is sure she took it last night.  In the ED, code stroke was called.  Patient was afebrile, heart rate in 70s, blood pressure was elevated to 218/90, breathing on room air. Labs with unremarkable CBC, BMP with glucose level elevated to 140, blood alcohol level not elevated CT scan of head did not show any acute finding.  It showed moderate chronic small-vessel ischemic changes of the cerebral hemispheric white matter Patient was seen by a neurologist Dr. Cheral Marker.  Differential diagnosis: Small acute stroke versus hypertensive encephalopathy Stroke workup was started For blood pressure control, patient was also started on clevidipine drip which significantly dropped her blood pressure down to 130s after which drip was stopped. Hospitalist service consulted for inpatient admission and management.  At the time of my evaluation, patient was propped up in bed.  Feeling dizzy.  Passed bedside swallow screen.  Her significant other was at bedside. Pending MRI  Review of Systems:  All systems were reviewed and were negative unless otherwise mentioned in the HPI   Past medical history: Past Medical History:  Diagnosis Date   Arthritis    Basal cell carcinoma 12/20/2018   R superior nasolabial  fold   Bell's palsy    H/O   Bell's palsy    Complication of anesthesia    Disease of thyroid gland    GERD (gastroesophageal reflux disease)    History of Clostridium difficile colitis    Hyperlipidemia    Hypertension    NO MEDS   Hypothyroidism    PONV (postoperative nausea and vomiting)     Past surgical history: Past Surgical History:  Procedure Laterality Date   ABDOMINAL HYSTERECTOMY     APPENDECTOMY     BREAST CYST EXCISION     BREAST EXCISIONAL BIOPSY Right 1981   benign   CATARACT EXTRACTION W/PHACO Right 05/02/2018   Procedure: CATARACT EXTRACTION PHACO AND INTRAOCULAR LENS PLACEMENT ([place]);  Surgeon: Marchia Meiers, MD;  Location: [facility] ORS;  Service: Ophthalmology;  Laterality: Right;  [place]  00:40 AP% CDE 7.17 Fluid pack lot # [id number]   CATARACT EXTRACTION W/PHACO Left 06/06/2018   Procedure: CATARACT EXTRACTION PHACO AND INTRAOCULAR LENS PLACEMENT ([facility]);  Surgeon: Marchia Meiers, MD;  Location: [facility] ORS;  Service: Ophthalmology;  Laterality: Left;  [place] 00:34.0 CDE 6.36 FLUID PACK LOT # [id number]   CHOLECYSTECTOMY     COLONOSCOPY     COLONOSCOPY WITH PROPOFOL N/A 03/11/2018   Procedure: COLONOSCOPY WITH PROPOFOL;  Surgeon: Lollie Sails, MD;  [facility] END[facility]COPY;  Service: Endoscopy;  Laterality: N/A;   Left Foot Surgery     Left Knee Surgery     RCR     THYROID SURGERY      Social History:  reports that she has quit smoking. She has never used smokeless tobacco.  She reports that she does not drink alcohol and does not use drugs.  Allergies:  Allergies  Allergen Reactions   Guaifenesin Shortness Of Breath   Lisinopril Shortness Of Breath and Palpitations   Tetanus Toxoids Anaphylaxis   Ciprofloxacin     Pt had muscle stiffness and pain in her knee while taking. Pt was having trouble walking.   Fluticasone     headaches   Imodium [Loperamide] Itching   Lovastatin     Unknown reaction    Metronidazole     Pt had muscle stiffness and pain in her  knee while taking. Pt was having trouble walking.    Paroxetine Nausea Only   Rosuvastatin Calcium Itching   Motrin [Ibuprofen] Rash    Pt states, "It counteracts my Synthroid."   Guaifenesin, Lisinopril, Tetanus toxoids, Ciprofloxacin, Fluticasone, Imodium [loperamide], Lovastatin, Metronidazole, Paroxetine, Rosuvastatin calcium, and Motrin [ibuprofen]   Family history:  Family History  Problem Relation Age of Onset   Cancer Father    Hypertension Father    Stroke Father    Heart attack Father    Stroke Mother    Breast cancer Sister    Heart attack Brother    Hypertension Brother      Home Meds: Prior to Admission medications   Medication Sig Start Date End Date Taking? Authorizing Provider  acetaminophen (TYLENOL) 500 MG tablet Take 500 mg by mouth daily as needed for moderate pain or headache. Patient not taking: Reported on 07/06/2022    [provider]  cetirizine (ZYRTEC) 10 MG tablet Take 10 mg by mouth daily. Patient not taking: Reported on 07/06/2022    [provider]  clobetasol ointment (TEMOVATE) 4.12 % Apply 1 Application topically 2 (two) times daily. 07/06/22   Moye, [place], MD  cyanocobalamin 1000 MCG tablet Take 1,000 mcg by mouth daily. Patient not taking: Reported on 07/06/2022    [provider]  ezetimibe (ZETIA) 10 MG tablet Take 10 mg by mouth every evening.  02/07/17   [provider]  losartan (COZAAR) 100 MG tablet Take 1 tablet by mouth at bedtime. 06/07/22   [provider]  losartan (COZAAR) 25 MG tablet Take by mouth. 09/25/19 09/30/20  [provider]  metFORMIN (GLUCOPHAGE-XR) 500 MG 24 hr tablet Take 500 mg by mouth daily.    [provider]  Naphazoline-Glycerin 0.03-0.5 % SOLN Place 1 drop into both eyes 3 (three) times daily as needed (for dry/irritated eyes). Patient not taking: Reported on 07/06/2022    [provider]  pantoprazole (PROTONIX) 20 MG tablet Take 20 mg by mouth  at bedtime.  02/20/18   [provider]  predniSONE (DELTASONE) 10 MG tablet Take 1 tablet (10 mg total) by mouth daily. Patient not taking: Reported on 04/14/2021 09/19/19   Darletta Moll, PA-C  SYNTHROID 112 MCG tablet Take 112 mcg by mouth daily before breakfast.  04/05/17   [provider]    Physical Exam: Vitals:   09/05/22 1410 09/05/22 1420 09/05/22 1437 09/05/22 1452  BP: (!) 156/77 (!) 165/77 (!) 164/81 139/67  Pulse: 72 76 69 72  Resp: 16 (!) '22 14 13  '$ Temp:      SpO2: 99% 99% 98% 99%  Weight:      Height:       Wt Readings from Last 3 Encounters:  09/05/22 88.8 kg  09/19/19 90.4 kg  06/06/18 83.5 kg   Body mass index is 31.6 kg/m.  General exam: Pleasant, elderly Caucasian female.  Not in pain Skin: No rashes, lesions or ulcers. HEENT: Atraumatic, normocephalic, no obvious bleeding Lungs: Clear to auscultation bilaterally CVS: Regular rate and rhythm, no murmur GI/Abd soft, nontender, nondistended, bowel sound present CNS: Alert, awake, oriented x 3.  Mild dizziness present Psychiatry: Mood appropriate Extremities: No pedal edema, no calf tenderness   ------------------------------------------------------------------------------------------------------ Assessment/Plan: Principal Problem:   TIA (transient ischemic attack)  Acute neurological symptoms Chronic right Bell's palsy Presented with left-sided face, arm and leg numbness that started about 3 hours prior to presentation.  Code stroke was called.  TNK was not given Neurology consulted. Differential diagnosis: Small acute stroke versus hypertensive encephalopathy Stroke workup was started Pending MRI brain, MRA head and neck I ordered for echocardiogram with bubble, A1c, lipid panel, TSH, PT/OT eval PTA on aspirin 81 mg daily, Given aspirin 325 mg 1 dose in the ED Neurology to decide antiplatelet plan. She has right facial weakness due to Bell's palsy several years  ago.  Hypertensive encephalopathy Initial blood pressure elevated over 200.  Patient's presenting symptoms could be due to elevated blood pressure. For blood pressure control, patient was also started on clevidipine drip which significantly dropped her blood pressure down to 130s after which drip was stopped. PTA on losartan 100 mg daily.  Will keep it on hold for now.  Start hydralazine as needed.  Type 2 diabetes mellitus A1c 6.1 in 2018.  Repeat A1c PTA on metformin 500 mg daily.  Keep it on hold for now. Start on sliding-scale insulin with Accu-Cheks Lab Results  Component Value Date   HGBA1C 6.1 (H) 03/07/2017   Recent Labs  Lab 09/05/22 1230  GLUCAP 140*   Hyperlipidemia Zetia  Hypothyroidism Continue Synthroid.  GERD PPI  Mobility: Encourage ambulation.  PT/OT eval ordered  Goals of care   Code Status: DNR.  Discussed with patient at bedside.  Patient has a clear understanding about DNR/DNI.   DVT prophylaxis: Lovenox subcu ordered   Antimicrobials: None Fluid: None Consultants: Neurology Family Communication: Significant other at bedside  Dispo: The patient is from: Home              Anticipated d/c is to: Hopefully home, pending workup  Diet: Diet Order             Diet heart healthy/carb modified Room service appropriate? Yes; Fluid consistency: Thin  Diet effective now                    ------------------------------------------------------------------------------------- Severity of Illness: The appropriate patient status for this patient is OBSERVATION. Observation status is judged to be reasonable and necessary in order to provide the required intensity of service to ensure the patient's safety. The patient's presenting symptoms, physical exam findings, and initial radiographic and laboratory data in the context of their medical condition is felt to place them at decreased risk for further clinical deterioration. Furthermore, it is anticipated  that the patient will be medically stable for discharge from the hospital within 2 midnights of admission.  -------------------------------------------------------------------------------------  Labs on Admission:   CBC: Recent Labs  Lab 09/05/22 1234  WBC 6.9  NEUTROABS 4.4  HGB 13.6  HCT 42.5  MCV 86.0  PLT 025    Basic Metabolic Panel: Recent Labs  Lab 09/05/22 1234  NA 140  K 3.7  CL 105  CO2 24  GLUCOSE 123*  BUN 16  CREATININE 0.76  CALCIUM 8.5*    Liver Function Tests: Recent Labs  Lab 09/05/22 1234  AST 21  ALT 20  ALKPHOS 90  BILITOT 0.8  PROT 7.1  ALBUMIN 3.8   No results for input(s): "LIPASE", "AMYLASE" in the last 168 hours. No results for input(s): "AMMONIA" in the last 168 hours.  Cardiac Enzymes: No results for input(s): "CKTOTAL", "CKMB", "CKMBINDEX", "TROPONINI" in the last 168 hours.  BNP (last 3 results) No results for input(s): "BNP" in the last 8760 hours.  ProBNP (last 3 results) No results for input(s): "PROBNP" in the last 8760 hours.  CBG: Recent Labs  Lab 09/05/22 1230  GLUCAP 140*    Lipase     Component Value Date/Time   LIPASE 20 03/06/2017 0748     Urinalysis    Component Value Date/Time   COLORURINE YELLOW (A) 03/06/2017 0749   APPEARANCEUR CLEAR (A) 03/06/2017 0749   LABSPEC 1.011 03/06/2017 0749   PHURINE 6.0 03/06/2017 0749   GLUCOSEU NEGATIVE 03/06/2017 0749   HGBUR SMALL (A) 03/06/2017 0749   BILIRUBINUR NEGATIVE 03/06/2017 0749   [place] 03/06/2017 0749   PROTEINUR NEGATIVE 03/06/2017 0749   NITRITE NEGATIVE 03/06/2017 0749   LEUKOCYTESUR NEGATIVE 03/06/2017 0749     Drugs of Abuse  No results found for: "LABOPIA", "COCAINSCRNUR", "LABBENZ", "AMPHETMU", "THCU", "LABBARB"    Radiological Exams on Admission: CT HEAD CODE STROKE WO CONTRAST  Result Date: 09/05/2022 CLINICAL DATA:  Code stroke. Neuro deficit, acute, stroke suspected. EXAM: CT HEAD WITHOUT CONTRAST TECHNIQUE: Contiguous  axial images were obtained from the base of the skull through the vertex without intravenous contrast. RADIATION DOSE REDUCTION: This exam was performed according to the departmental dose-optimization program which includes automated exposure control, adjustment of the mA and/or kV according to patient size and/or use of iterative reconstruction technique. COMPARISON:  09/19/2019 FINDINGS: Brain: There are moderate chronic small-vessel ischemic changes of the cerebral hemispheric white matter. No sign of acute infarction, mass lesion, hemorrhage, hydrocephalus or extra-axial collection Vascular: No evidence of acute hyperdense vessel. There is atherosclerotic calcification of the major vessels at the base of the brain. Skull: Negative Sinuses/Orbits: Clear/normal Other: None ASPECTS ([place] Stroke Program Early CT Score) - Ganglionic level infarction (caudate, lentiform nuclei, internal capsule, insula, M1-M3 cortex): 7 - Supraganglionic infarction (M4-M6 cortex): 3 Total score (0-10 with 10 being normal): 10 IMPRESSION: 1. No acute CT finding. Moderate chronic small-vessel ischemic changes of the cerebral hemispheric white matter. 2. Aspects is 10. These results were communicated to Dr. Cheral Marker at 12:50 pm on 09/05/2022 by text page via the [facility] messaging system. Electronically Signed   By: Nelson Chimes M.D.   On: 09/05/2022 12:51     Signed, Terrilee Croak, MD [facility] 09/05/2022

## 2022-09-05 NOTE — ED Notes (Signed)
Pts BP responding to medication. Per neuro, goal is for SBP to be lower than 160. Pt c/o of dizziness at this time. All other vitals stable at this time. Stroke coordinator at bedside with Probation officer.

## 2022-09-05 NOTE — ED Notes (Signed)
Code stroke called to [place] at [facility] per Sam with Left side weakness and numbness to face and leg

## 2022-09-05 NOTE — Code Documentation (Addendum)
Stroke Response Nurse Documentation Code Documentation  Rebecca Conner is a 72 y.o. female arriving to [facility] via [employer] on 09/05/2022 with past medical hx of GERD, hypothyroid, HLD, Bell's Palsy, HTN. On aspirin 81 mg daily. Code stroke was activated by ED.   Patient from [facility] where she was LKW at 0930 and now complaining of left sided decreased sensation. Patient reports she was normal at 0900 and at 0930 she started experiencing left side weakness and decreased sensation. Patient called her doctor, drove herself to [facility] to be evaluated, and was brought to ED for stroke-like symptoms.   Stroke team at the bedside on patient arrival. Labs drawn and patient cleared for CT by Dr. Jori Moll. Patient to CT with team. NIHSS 6, see documentation for details and code stroke times. Patient with left facial droop, left arm weakness, bilateral leg weakness, left decreased sensation, and dysarthria  on exam. The following imaging was completed:  CT Head. Patient is not a candidate for IV Thrombolytic due to too mild to treat/patient refusal. Patient is not a candidate for IR due to LVO no suspected per MD. Delay: Patient/family decision making in CT, follow up assessment decreased. Patient taken back to room. Education given to patient and family at bedside on stroke symptoms and calling 911  Care Plan: q30 min NIHSS and vital signs until outside window at 1400 followed by q2 NIHSS and vital signs. SBP <160 per order.   Bedside handoff with ED RN Derrill Memo  Stroke Response RN

## 2022-09-05 NOTE — Consult Note (Signed)
NEURO HOSPITALIST CONSULT NOTE   Requesting physician: Dr. Jori Moll  Reason for Consult: Acute onset of left sided numbness and weakness in conjunction with dysarthria  History obtained from:  Patient and Chart     HPI:                                                                                                                                          Rebecca Conner is an 72 y.o. female with a PMHx of arthritis, right sided Bell's palsy, HLD, HTN, migraines and hypothyroidism who presents to the ED for evaluation of acute onset left face, arm and leg sensory numbness and tingling in conjunction with left sided weakness. She awoke this morning feeling normal. LKN was 0930 when she abruptly began to experience the above symptoms. She drove herself to the [facility] [facility] to be evaluated by her PCP and then was brought to the ED for further assessment, where a Code Stroke was called. She endorses having had a mild left-sided headache together with some left eye pain for the past 2 days. She is not taking an anticoagulant. She has no prior history of similar symptoms. Home medications include ASA 81 mg qd.   Past Medical History:  Diagnosis Date   Arthritis    Basal cell carcinoma 12/20/2018   R superior nasolabial fold   Bell's palsy    H/O   Bell's palsy    Complication of anesthesia    Disease of thyroid gland    GERD (gastroesophageal reflux disease)    History of Clostridium difficile colitis    Hyperlipidemia    Hypertension    NO MEDS   Hypothyroidism    PONV (postoperative nausea and vomiting)     Past Surgical History:  Procedure Laterality Date   ABDOMINAL HYSTERECTOMY     APPENDECTOMY     BREAST CYST EXCISION     BREAST EXCISIONAL BIOPSY Right 1981   benign   CATARACT EXTRACTION W/PHACO Right 05/02/2018   Procedure: CATARACT EXTRACTION PHACO AND INTRAOCULAR LENS PLACEMENT ([place]);  Surgeon: Marchia Meiers, MD;  Location: [facility] ORS;  Service:  Ophthalmology;  Laterality: Right;  [place]  00:40 AP% CDE 7.17 Fluid pack lot # [id number]   CATARACT EXTRACTION W/PHACO Left 06/06/2018   Procedure: CATARACT EXTRACTION PHACO AND INTRAOCULAR LENS PLACEMENT ([facility]);  Surgeon: Marchia Meiers, MD;  Location: [facility] ORS;  Service: Ophthalmology;  Laterality: Left;  [place] 00:34.0 CDE 6.36 FLUID PACK LOT # [id number]   CHOLECYSTECTOMY     COLONOSCOPY     COLONOSCOPY WITH PROPOFOL N/A 03/11/2018   Procedure: COLONOSCOPY WITH PROPOFOL;  Surgeon: Lollie Sails, MD;  Location: [facility] ENDOSCOPY;  Service: Endoscopy;  Laterality: N/A;   Left Foot Surgery     Left Knee Surgery  RCR     THYROID SURGERY      Family History  Problem Relation Age of Onset   Cancer Father    Hypertension Father    Stroke Father    Heart attack Father    Stroke Mother    Breast cancer Sister    Heart attack Brother    Hypertension Brother              Social History:  reports that she has quit smoking. She has never used smokeless tobacco. She reports that she does not drink alcohol and does not use drugs.  Allergies  Allergen Reactions   Guaifenesin Shortness Of Breath   Lisinopril Shortness Of Breath and Palpitations   Tetanus Toxoids Anaphylaxis   Ciprofloxacin     Pt had muscle stiffness and pain in her knee while taking. Pt was having trouble walking.   Fluticasone     headaches   Imodium [Loperamide] Itching   Lovastatin     Unknown reaction    Metronidazole     Pt had muscle stiffness and pain in her knee while taking. Pt was having trouble walking.    Paroxetine Nausea Only   Rosuvastatin Calcium Itching   Motrin [Ibuprofen] Rash    Pt states, "It counteracts my Synthroid."    MEDICATIONS:                                                                                                                      No current facility-administered medications on file prior to encounter.   Current Outpatient Medications on File Prior to Encounter  Medication  Sig Dispense Refill   aspirin EC 81 MG tablet Take 81 mg by mouth daily. Swallow whole.     cyanocobalamin 1000 MCG tablet Take 1,000 mcg by mouth daily.     ezetimibe (ZETIA) 10 MG tablet Take 10 mg by mouth every evening.      guaiFENesin (MUCINEX) 600 MG 12 hr tablet Take by mouth at bedtime.     losartan (COZAAR) 100 MG tablet Take 1 tablet by mouth at bedtime.     metFORMIN (GLUCOPHAGE-XR) 500 MG 24 hr tablet Take 500 mg by mouth daily.     pantoprazole (PROTONIX) 20 MG tablet Take 20 mg by mouth at bedtime.   1   SYNTHROID 112 MCG tablet Take 112 mcg by mouth daily before breakfast.      acetaminophen (TYLENOL) 500 MG tablet Take 500 mg by mouth daily as needed for moderate pain or headache. (Patient not taking: Reported on 07/06/2022)     cetirizine (ZYRTEC) 10 MG tablet Take 10 mg by mouth daily. (Patient not taking: Reported on 07/06/2022)     clobetasol ointment (TEMOVATE) 1.77 % Apply 1 Application topically 2 (two) times daily. (Patient not taking: Reported on 09/05/2022) 30 g 0   losartan (COZAAR) 25 MG tablet Take by mouth.     Naphazoline-Glycerin 0.03-0.5 % SOLN Place 1 drop into both eyes 3 (three) times daily as  needed (for dry/irritated eyes). (Patient not taking: Reported on 07/06/2022)     predniSONE (DELTASONE) 10 MG tablet Take 1 tablet (10 mg total) by mouth daily. (Patient not taking: Reported on 04/14/2021) 42 tablet 0     ROS:                                                                                                                                       Complains of left sided headache and periorbital pain x 3 days. No right sided weakness. Has some dizziness that she is unable to further characterize. No vision loss. No confusion. No trouble with word finding. No difficulty with ambulation. Other ROS as per HPI.    Blood pressure (!) 218/90, pulse 73, temperature 97.7 F (36.5 C), resp. rate 19, SpO2 94 %.   General Examination:                                                                                                        Physical Exam HEENT-  [place]/AT   Lungs- Respirations unlabored Extremities- Warm and well-perfused  Neurological Examination Mental Status: Awake and alert. Fully oriented. Speech fluent but hypophonic and mildly dysarthric. Hypophonia is to the extent that examiner needs to put ear near her face to best listen to her speech. Naming and comprehension intact. Able to follow all commands without difficulty. Cranial Nerves: II: Temporal visual fields intact with no extinction to DSS. PERRL. III,IV, VI: No ptosis. EOMI. No nystagmus. V: Temp sensation decreased on the left.  VII: Decreased NL fold and length of labial fissure on the right (chronic, secondary to prior Bell's palsy) VIII: Hearing intact to voice IX,X: Hypophonic speech XI: Symmetric XII: Midline tongue extension Motor: RUE: 4-5/5 with giveway weakness that can be coached to full strength RLE: 5/5 LUE: 5/5 LLE: 5/5 Sensory: Temp and FT decreased to LUE and LLE. No extinction to DSS.  Deep Tendon Reflexes: 2+ and symmetric bilateral brachioradialis and patellae. Toes downgoing bilaterally.  Cerebellar: No ataxia with FNF bilaterally. H-S without ataxia bilaterally.  Gait: Deferred  NIHSS: 3 if including right facial droop from prior Bell's palsy; 2 for deficits corresponding to her new symptoms    Lab Results: Basic Metabolic Panel: Recent Labs  Lab 09/05/22 1234  NA 140  K 3.7  CL 105  CO2 24  GLUCOSE 123*  BUN 16  CREATININE 0.76  CALCIUM 8.5*    CBC: Recent Labs  Lab 09/05/22 1234  WBC 6.9  NEUTROABS 4.4  HGB 13.6  HCT 42.5  MCV 86.0  PLT 336    Cardiac Enzymes: No results for input(s): "CKTOTAL", "CKMB", "CKMBINDEX", "TROPONINI" in the last 168 hours.  Lipid Panel: No results for input(s): "CHOL", "TRIG", "HDL", "CHOLHDL", "VLDL", "LDLCALC" in the last 168 hours.  Imaging: CT HEAD CODE STROKE WO CONTRAST  Result Date:  09/05/2022 CLINICAL DATA:  Code stroke. Neuro deficit, acute, stroke suspected. EXAM: CT HEAD WITHOUT CONTRAST TECHNIQUE: Contiguous axial images were obtained from the base of the skull through the vertex without intravenous contrast. RADIATION DOSE REDUCTION: This exam was performed according to the departmental dose-optimization program which includes automated exposure control, adjustment of the mA and/or kV according to patient size and/or use of iterative reconstruction technique. COMPARISON:  09/19/2019 FINDINGS: Brain: There are moderate chronic small-vessel ischemic changes of the cerebral hemispheric white matter. No sign of acute infarction, mass lesion, hemorrhage, hydrocephalus or extra-axial collection Vascular: No evidence of acute hyperdense vessel. There is atherosclerotic calcification of the major vessels at the base of the brain. Skull: Negative Sinuses/Orbits: Clear/normal Other: None ASPECTS ([place] Stroke Program Early CT Score) - Ganglionic level infarction (caudate, lentiform nuclei, internal capsule, insula, M1-M3 cortex): 7 - Supraganglionic infarction (M4-M6 cortex): 3 Total score (0-10 with 10 being normal): 10 IMPRESSION: 1. No acute CT finding. Moderate chronic small-vessel ischemic changes of the cerebral hemispheric white matter. 2. Aspects is 10. These results were communicated to Dr. Cheral Marker at 12:50 pm on 09/05/2022 by text page via the [facility] messaging system. Electronically Signed   By: Nelson Chimes M.D.   On: 09/05/2022 12:51     Assessment: 72 year old female presenting with a 3-day history of left sided headache and periorbital pain followed by new onset of dysarthria and left sided numbness at 0930 today. Initial NIHSS on arrival to the ED was 6.  - Exam after completion of CT reveals chronic sequelae of prior Bell's palsy (right), dysarthria with hypophonic speech and giveway weakness to LUE. NIHSS of 2 if not including facial droop from old right Bell's palsy.  - CT  head: No acute CT finding. Moderate chronic small-vessel ischemic changes of the cerebral hemispheric white matter. Aspects is 10. - Patient declined TNK after discussion of risks/benefits.  - DDx for presentation includes a small acute stroke, but hypertensive urgency manifesting with neurological deficits is felt to be significantly more likely. Anxiety-related symptoms are also possible.    Recommendations: - Labetalol 10 mg IV x 1 - Clevidipine gtt with titration to goal SBP < 160 - MRI brain - MRA of head and neck - If headache persists after BP improvement, administer 10 mg IV Compazine - TTE - PT/OT/Speech consults - ASA 325 mg po crushed x 1 now - Continue ASA 81 mg po qd - HgbA1c and fasting lipid panel - Cardiac telemetry    Electronically signed: Dr. Kerney Elbe 09/05/2022, 1:21 PM

## 2022-09-05 NOTE — ED Provider Notes (Signed)
[facility] Provider Note    Event Date/Time   First MD Initiated Contact with Patient 09/05/22 1246     (approximate)   History   Code Stroke   HPI  Rebecca Conner is a 72 y.o. female presents to the emergency department with strokelike symptoms.  Patient's last known well was at 930 today.  States that over the past couple of days she has had a mild headache.  Today started having some numbness and tingling to the left side of her body.  Felt like she was slightly more weak.  Denies any recent falls or head trauma.  Not on anticoagulation or antiplatelet agents.  No similar symptoms in the past.     Physical Exam   Triage Vital Signs: ED Triage Vitals  Enc Vitals Group     BP 09/05/22 1233 (!) 218/90     Pulse Rate 09/05/22 1233 73     Resp 09/05/22 1233 19     Temp 09/05/22 1233 97.7 F (36.5 C)     Temp src --      SpO2 09/05/22 1233 94 %     Weight --      Height --      Head Circumference --      Peak Flow --      Pain Score 09/05/22 1232 4     Pain Loc --      Pain Edu? --      Excl. in [place]? --     Most recent vital signs: Vitals:   09/05/22 1437 09/05/22 1452  BP: (!) 164/81 139/67  Pulse: 69 72  Resp: 14 13  Temp:    SpO2: 98% 99%    Physical Exam Constitutional:      Appearance: She is well-developed.  HENT:     Head: Atraumatic.  Eyes:     Conjunctiva/sclera: Conjunctivae normal.  Cardiovascular:     Rate and Rhythm: Regular rhythm.  Pulmonary:     Effort: No respiratory distress.  Abdominal:     General: There is no distension.  Musculoskeletal:        General: Normal range of motion.     Cervical back: Normal range of motion.  Skin:    General: Skin is warm.  Neurological:     Mental Status: She is alert. Mental status is at baseline.     GCS: GCS eye subscore is 4. GCS verbal subscore is 5. GCS motor subscore is 6.     Cranial Nerves: Facial asymmetry present.     Sensory: Sensory deficit ([place]  sensation to the left face and left leg.  Sensation intact to bilateral upper extremities) present.     Motor: No pronator drift.     Coordination: Coordination is intact.     IMPRESSION / MDM / ASSESSMENT AND PLAN / ED COURSE  I reviewed the triage vital signs and the nursing notes.  Presents to the emergency department with strokelike symptoms.  Last known well was at 930 today.  Patient within the window of TNK.  Activated code stroke.  Patient immediately taken back to CT scanner and neurology Dr. Max Fickle bedside.  Differential diagnosis including ischemic CVA, hemorrhagic CVA, complex migraine  EKG  I, Nathaniel Man, the attending physician, personally viewed and interpreted this ECG.   Rate: Normal  Rhythm: Normal sinus  Axis: Normal  Intervals: Normal  ST&T Change: None  No tachycardic or bradycardic dysrhythmias while on cardiac telemetry.  RADIOLOGY I independently  reviewed imaging, my interpretation of imaging: CT scan of the head shows no signs of intracranial hemorrhage or infarction.  LABS (all labs ordered are listed, but only abnormal results are displayed) Labs interpreted as -    Labs Reviewed  COMPREHENSIVE METABOLIC PANEL - Abnormal; Notable for the following components:      Result Value   Glucose, Bld 123 (*)    Calcium 8.5 (*)    All other components within normal limits  CBG MONITORING, ED - Abnormal; Notable for the following components:   Glucose-Capillary 140 (*)    All other components within normal limits  PROTIME-INR  APTT  CBC  DIFFERENTIAL  ETHANOL  HEMOGLOBIN A1C  TSH  CBG MONITORING, ED  I-STAT CREATININE, ED    TREATMENT  Cleviprex infusion for BP goal 160, aspirin  MDM  Neurology recommended treatment for hypertensive emergency with tight blood pressure control patient had significant drop of her systolic blood pressures to 160 systolic and felt worse.  Did have improvement of her symptoms whenever her blood pressure  returned to closer to 737 systolic.  Consulted hospitalist for admission for strokelike symptoms and hypertension     PROCEDURES:  Critical Care performed: Yes  .Critical Care  Performed by: Nathaniel Man, MD Authorized by: Nathaniel Man, MD   Critical care provider statement:    Critical care time (minutes):  45   Critical care time was exclusive of:  Separately billable procedures and treating other patients   Critical care was necessary to treat or prevent imminent or life-threatening deterioration of the following conditions:  CNS failure or compromise   Critical care was time spent personally by me on the following activities:  Development of treatment plan with patient or surrogate, discussions with consultants, evaluation of patient's response to treatment, examination of patient, ordering and review of laboratory studies, ordering and review of radiographic studies, ordering and performing treatments and interventions, pulse oximetry, re-evaluation of patient's condition and review of old charts   Patient's presentation is most consistent with acute presentation with potential threat to life or bodily function.   MEDICATIONS ORDERED IN ED: Medications  sodium chloride flush (NS) 0.9 % injection 3 mL (3 mLs Intravenous Not Given 09/05/22 1435)  clevidipine (CLEVIPREX) infusion 0.5 mg/mL (0 mg/hr Intravenous Stopped 09/05/22 1346)  ezetimibe (ZETIA) tablet 10 mg (has no administration in time range)  levothyroxine (SYNTHROID) tablet 112 mcg (has no administration in time range)  insulin aspart (novoLOG) injection 0-9 Units (has no administration in time range)  insulin aspart (novoLOG) injection 0-5 Units (has no administration in time range)  acetaminophen (TYLENOL) tablet 650 mg (has no administration in time range)    Or  acetaminophen (TYLENOL) suppository 650 mg (has no administration in time range)  albuterol (PROVENTIL) (2.5 MG/3ML) 0.083% nebulizer solution 2.5 mg (has  no administration in time range)  hydrALAZINE (APRESOLINE) injection 10 mg (has no administration in time range)  enoxaparin (LOVENOX) injection 45 mg (has no administration in time range)  pantoprazole (PROTONIX) EC tablet 20 mg (has no administration in time range)  aspirin EC tablet 325 mg (325 mg Oral Given 09/05/22 1421)  labetalol (NORMODYNE) injection 10 mg (10 mg Intravenous Given 09/05/22 1326)    FINAL CLINICAL IMPRESSION(S) / ED DIAGNOSES   Final diagnoses:  Stroke-like symptoms     Rx / DC Orders   ED Discharge Orders     None        Note:  This document was prepared using Dragon voice recognition  software and may include unintentional dictation errors.   Nathaniel Man, MD 09/05/22 1547

## 2022-09-05 NOTE — Progress Notes (Signed)
Unable to do 6pm NIH d/t patient being gone to MRI.

## 2022-09-05 NOTE — ED Triage Notes (Addendum)
Pt comes with c/o left sided face, arm numbness and leg numbness. Pt states she woke up this am and was fine but then felt this started about 930. Pt denies any thinners or falls recently. Pt has slight droop noted.   Secretary called and 1st Rn and informed of calling stroke.

## 2022-09-05 NOTE — Progress Notes (Signed)
PHARMACIST CODE STROKE RESPONSE  Borderline symptoms. Team discussed with patient and decided adjust TNK.  No TNK given.   Oswald Hillock, PharmD, BCPS 09/05/22 1:24 PM

## 2022-09-06 ENCOUNTER — Observation Stay
Admit: 2022-09-06 | Discharge: 2022-09-06 | Disposition: A | Discharge status: Home / Self Care | Payer: Medicare HMO | Attending: Internal Medicine | Admitting: Internal Medicine

## 2022-09-06 DIAGNOSIS — G459 Transient cerebral ischemic attack, unspecified: Secondary | ICD-10-CM | POA: Diagnosis not present

## 2022-09-06 LAB — BASIC METABOLIC PANEL
Anion gap: 9 (ref 5–15)
BUN: 17 mg/dL (ref 8–23)
CO2: 25 mmol/L (ref 22–32)
Calcium: 8 mg/dL — ABNORMAL LOW (ref 8.9–10.3)
Chloride: 106 mmol/L (ref 98–111)
Creatinine, Ser: 0.76 mg/dL (ref 0.44–1.00)
GFR, Estimated: >60 mL/min (ref >60)
Glucose, Bld: 125 mg/dL — ABNORMAL HIGH (ref 70–99)
Potassium: 3.5 mmol/L (ref 3.5–5.1)
Sodium: 140 mmol/L (ref 135–145)

## 2022-09-06 LAB — GLUCOSE, CAPILLARY
Glucose-Capillary: 136 mg/dL — ABNORMAL HIGH (ref 70–99)
Glucose-Capillary: 125 mg/dL — ABNORMAL HIGH (ref 70–99)

## 2022-09-06 LAB — CBC
HCT: 39 % (ref 36.0–46.0)
Hemoglobin: 12.5 g/dL (ref 12.0–15.0)
MCH: 27.6 pg (ref 26.0–34.0)
MCHC: 32.1 g/dL (ref 30.0–36.0)
MCV: 86.1 fL (ref 80.0–100.0)
Platelets: 296 10*3/uL (ref 150–400)
RBC: 4.53 MIL/uL (ref 3.87–5.11)
RDW: 14 % (ref 11.5–15.5)
WBC: 7.6 10*3/uL (ref 4.0–10.5)
nRBC: 0 % (ref 0.0–0.2)

## 2022-09-06 LAB — LIPID PANEL
Cholesterol: 214 mg/dL — ABNORMAL HIGH (ref 0–200)
HDL: 58 mg/dL (ref >40)
LDL Cholesterol: 141 mg/dL — ABNORMAL HIGH (ref 0–99)
Total CHOL/HDL Ratio: 3.7 RATIO
Triglycerides: 75 mg/dL (ref <150)
VLDL: 15 mg/dL (ref 0–40)

## 2022-09-06 LAB — ECHOCARDIOGRAM COMPLETE BUBBLE STUDY
AR max vel: 1.98 cm2
AV Area VTI: 2.38 cm2
AV Area mean vel: 2.05 cm2
AV Mean grad: 3.5 mmHg
AV Peak grad: 6.1 mmHg
Ao pk vel: 1.23 m/s
Area-P 1/2: 2.45 cm2
S' Lateral: 2.9 cm

## 2022-09-06 MED ORDER — LOSARTAN POTASSIUM 50 MG PO TABS
100.0000 mg | ORAL_TABLET | Freq: Every day | ORAL | Status: DC
  Administered 2022-09-06: 100 mg via ORAL
  Filled 2022-09-06: qty 2

## 2022-09-06 MED ORDER — ASPIRIN 81 MG PO TBEC
81.0000 mg | DELAYED_RELEASE_TABLET | Freq: Every day | ORAL | Status: DC
  Administered 2022-09-06: 81 mg via ORAL
  Filled 2022-09-06: qty 1

## 2022-09-06 MED ORDER — ALBUTEROL SULFATE (2.5 MG/3ML) 0.083% IN NEBU: 2.5000 mg | INHALATION_SOLUTION | Freq: Four times a day (QID) | RESPIRATORY_TRACT | Status: DC | PRN

## 2022-09-06 MED ORDER — METFORMIN HCL ER 500 MG PO TB24: 500.0000 mg | ORAL_TABLET | Freq: Every day | ORAL | Status: AC

## 2022-09-06 NOTE — TOC Initial Note (Signed)
Transition of Care [facility]) - Initial/Assessment Note    Patient Details  Name: Rebecca Conner MRN: [id number] Date of Birth: 1950/09/05  Transition of Care [facility]) CM/SW Contact:    Laurena Slimmer, RN Phone Number: 09/06/2022, 1:03 PM  Clinical Narrative:                 . Transition of Care [facility]) Screening Note   Patient Details  Name: Rebecca Conner Date of Birth: 1950/10/16   [facility] of [facility]) CM/SW Contact:    Laurena Slimmer, RN Phone Number: 09/06/2022, 1:04 PM    Transition of Care Department [facility]) has reviewed patient and no TOC needs have been identified at this time. We will continue to monitor patient advancement through interdisciplinary progression rounds. If new patient transition needs arise, please place a TOC consult.          Patient Goals and CMS Choice            Expected Discharge Plan and Services                                              Prior Living Arrangements/Services                       Activities of Daily Living Home Assistive Devices/Equipment: None ADL Screening (condition at time of admission) Patient's cognitive ability adequate to safely complete daily activities?: Yes Is the patient deaf or have difficulty hearing?: No Does the patient have difficulty seeing, even when wearing glasses/contacts?: No Does the patient have difficulty concentrating, remembering, or making decisions?: No Patient able to express need for assistance with ADLs?: Yes Does the patient have difficulty dressing or bathing?: No Independently performs ADLs?: Yes (appropriate for developmental age) Does the patient have difficulty walking or climbing stairs?: No Weakness of Legs: Left Weakness of Arms/Hands: Left  Permission Sought/Granted                  Emotional Assessment              Admission diagnosis:  TIA (transient ischemic attack) [G45.9] Stroke-like symptoms [R29.90] Patient Active Problem  List   Diagnosis Date Noted   TIA (transient ischemic attack) 09/05/2022   Clostridium difficile infection 03/15/2017   Sepsis ([place]) 03/06/2017   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 [place] [address] Phone: [phone] Fax: [place], [place] - [address] [place] [address] Phone: [phone] Fax: [phone]     Social Determinants of Health (SDOH) Social History: SDOH Screenings   Food Insecurity: No Food Insecurity (09/05/2022)  Housing: Low Risk  (09/05/2022)  Transportation Needs: No Transportation Needs (09/05/2022)  Utilities: Not At Risk (09/05/2022)  Financial Resource Strain: Low Risk  (03/11/2018)  Physical Activity: Unknown (03/11/2018)  Social Connections: Unknown (03/11/2018)  Stress: No Stress Concern Present (03/11/2018)  Tobacco Use: Medium Risk (09/05/2022)   SDOH Interventions:  Readmission Risk Interventions     No data to display

## 2022-09-06 NOTE — Evaluation (Signed)
Occupational Therapy Evaluation Patient Details Name: Rebecca Conner MRN: [id number] DOB: Feb 11, 1951 Today's Date: 09/06/2022   History of Present Illness Rebecca Conner is a 72 y.o. female with PMH significant for DM2, HTN, HLD, Bell's palsy, GERD who presented to the ED today with complaint of strokelike symptoms.   Clinical Impression   Rebecca Conner was seen for OT evaluation this date. Prior to hospital admission, pt was IND. Pt lives alone. Pt currently IND transfers and dressing. SUPERVISION standing grooming tasks and funcitonal mobility ~100 ft, reports baseline R knee pain impacting balance however no LOBs. Completed x4 steps IND. All education complete, will sign off. Upon hospital discharge, recommend no OT follow up.   Recommendations for follow up therapy are one component of a multi-disciplinary discharge planning process, led by the attending physician.  Recommendations may be updated based on patient status, additional functional criteria and insurance authorization.   Follow Up Recommendations  No OT follow up     Assistance Recommended at Discharge Set up Supervision/Assistance  Patient can return home with the following  No needs    Functional Status Assessment  Patient has not had a recent decline in their functional status  Equipment Recommendations  None recommended by OT    Recommendations for Other Services       Precautions / Restrictions Precautions Precautions: None Restrictions Weight Bearing Restrictions: No      Mobility Bed Mobility Overal bed mobility: Independent                  Transfers Overall transfer level: Independent                        Balance Overall balance assessment: Mild deficits observed, not formally tested                                         ADL either performed or assessed with clinical judgement   ADL Overall ADL's : Needs assistance/impaired                                        General ADL Comments: IND dressing. SUPERVISION standing grooming tasks and funcitonal mobility ~100 ft      Pertinent Vitals/Pain Pain Assessment Pain Assessment: No/denies pain     Hand Dominance Left   Extremity/Trunk Assessment Upper Extremity Assessment Upper Extremity Assessment: Overall WFL for tasks assessed   Lower Extremity Assessment Lower Extremity Assessment: Overall WFL for tasks assessed       Communication Communication Communication: No difficulties   Cognition Arousal/Alertness: Awake/alert Behavior During Therapy: WFL for tasks assessed/performed Overall Cognitive Status: Within Functional Limits for tasks assessed                                        Home Living Family/patient expects to be discharged to:: Private residence Living Arrangements: Alone Available Help at Discharge: Family;Available PRN/intermittently Type of Home: Mobile home Home Access: Stairs to enter Entrance Stairs-Number of Steps: 3 Entrance Stairs-Rails: Can reach both Home Layout: One level     Bathroom Shower/Tub: Tub/shower unit         Home Equipment: None  Prior Functioning/Environment Prior Level of Function : Independent/Modified Independent;Driving                        OT Problem List: Decreased strength;Decreased activity tolerance         OT Goals(Current goals can be found in the care plan section) Acute Rehab OT Goals Patient Stated Goal: go home OT Goal Formulation: With patient/family Time For Goal Achievement: 09/20/22 Potential to Achieve Goals: Good   AM-PAC OT "6 Clicks" Daily Activity     Outcome Measure Help from another person eating meals?: None Help from another person taking care of personal grooming?: None Help from another person toileting, which includes using toliet, bedpan, or urinal?: None Help from another person bathing (including washing, rinsing, drying)?:  None Help from another person to put on and taking off regular upper body clothing?: None Help from another person to put on and taking off regular lower body clothing?: None 6 Click Score: 24   End of Session    Activity Tolerance: Patient tolerated treatment well Patient left: in chair;with call bell/phone within reach;with family/visitor present  OT Visit Diagnosis: Other abnormalities of gait and mobility (R26.89)                Time: 0950-1007 OT Time Calculation (min): 17 min Charges:  OT General Charges $OT Visit: 1 Visit OT Evaluation $OT Eval Low Complexity: 1 Low  Dessie Coma, M.S. OTR/L  09/06/22, 11:09 AM  ascom [phone]

## 2022-09-06 NOTE — Progress Notes (Signed)
*  PRELIMINARY RESULTS* Echocardiogram 2D Echocardiogram has been performed.  Rebecca Conner 09/06/2022, 9:01 AM

## 2022-09-06 NOTE — Progress Notes (Signed)
PT Cancellation Note  Patient Details Name: Rebecca Conner MRN: [id number] DOB: 1951/06/29   Cancelled Treatment:    Reason Eval/Treat Not Completed:  (Consult received and chart reviewed.  Per OT, patient indep with mobility (no assist device), functioning at baseline level of ability. No acute PT needs identified.  Will complete intial order; please reconsult should needs change.)  Emrie Gayle H. Owens Shark, PT, DPT, NCS 09/06/22, 11:04 AM [phone]

## 2022-09-06 NOTE — Progress Notes (Signed)
Physician Discharge Summary  Rashell Shambaugh QTM:[id number] DOB: 07/08/1951 DOA: 09/05/2022  PCP: Dion Body, MD  Admit date: 09/05/2022 Discharge date: 09/06/2022  Admitted From: Home Discharge disposition: Home  Recommendations at discharge:  Ensure compliance with blood pressure meds.  Brief narrative: Rebecca Conner is a 72 y.o. female with PMH significant for DM2, HTN, HLD, Bell's palsy, GERD who presented to the ED today with complaint of strokelike symptoms.  Patient complained of mild headache for last few days. This morning, patient woke up fine but then started to have left-sided face, arm and leg numbness around 9:30 AM.  She presented to the 3 hours later ED on 12:30 PM.  Not on any blood thinners. She endorses inconsistent compliance to losartan but she is sure she took it last night.  In the ED, code stroke was called.  Patient was afebrile, heart rate in 70s, blood pressure was elevated to 218/90, breathing on room air. Labs with unremarkable CBC, BMP with glucose level elevated to 140, blood alcohol level not elevated CT scan of head did not show any acute finding.  It showed moderate chronic small-vessel ischemic changes of the cerebral hemispheric white matter Patient was seen by a neurologist Dr. Cheral Marker.  Differential diagnosis: Small acute stroke versus hypertensive encephalopathy Stroke workup was started For blood pressure control, patient was also started on clevidipine drip which significantly dropped her blood pressure down to 130s after which drip was stopped. Hospitalist service consulted for inpatient admission and management.  MRI brain did not show any acute intracranial infarct.  But it showed moderately advanced chronic microvascular ischemic disease. MRA head and neck were negative for any large vessel occlusion.  But it showed moderate multifocal segmental stenosis involving the P2 segments bilaterally.  Subjective: Patient was seen and  examined this morning.  Propped up in bed.  Feels better today.  Sister at bedside.  We discussed the MRI and MRI findings. Overnight, blood pressure in 150s mostly  Hospital course: TIA Chronic right Bell's palsy Presented with left-sided face, arm and leg numbness that started about 3 hours prior to presentation.  Code stroke was called.  TNK was not given Neurology consulted. Differential diagnosis: Small acute stroke versus hypertensive encephalopathy Stroke workup was started CT scan of head, MRI brain, MRA head and neck as above. Echocardiogram did not show any cardiac source of embolism or interatrial communication. Lipid panel with HDL 58, LDL 141, A1c 6.6 Given aspirin 325 mg 1 dose in the ED PTA on aspirin 81 mg daily.  Currently continued on the same per neurology recommendation. She has right facial weakness due to Bell's palsy several years ago.  Hypertensive encephalopathy Initial blood pressure elevated over 200.  Patient's presenting symptoms could have been aggravated by elevated blood pressure as well. For blood pressure control, patient was also started on clevidipine drip which significantly dropped her blood pressure down to 130s after which drip was stopped. PTA on losartan 100 mg daily.  Continue the same.  Encourage compliance.  Type 2 diabetes mellitus A1c 6.6 on 09/05/2022 PTA on metformin 500 mg daily.  Currently on hold.  Post discharge, can resume metformin starting 09/08/2022 Continue sliding-scale insulin with Accu-Cheks Recent Labs  Lab 09/05/22 1230 09/05/22 1659 09/05/22 1949 09/06/22 0800 09/06/22 1130  GLUCAP 140* 108* 127* 136* 125*   Hyperlipidemia Zetia.  Patient was recommended to start on statin.  But she states that she has allergies to statin in the past.  Hypothyroidism Continue Synthroid.  GERD PPI  Goals of care   Code Status: DNR.  Discussed with patient at bedside.  Patient has a clear understanding about  DNR/DNI.    Wounds:  - Incision (Closed) 05/02/18 Eye Right (Active)  Date First Assessed/Time First Assessed: 05/02/18 0846   Location: Eye  Location Orientation: Right    No assessment data to display     No associated orders.     Incision (Closed) 06/06/18 Eye Left (Active)  Date First Assessed/Time First Assessed: 06/06/18 0701   Location: Eye  Location Orientation: Left    No assessment data to display     No associated orders.    Discharge Exam:   Vitals:   09/06/22 0035 09/06/22 0431 09/06/22 0747 09/06/22 1129  BP: (!) 134/58 (!) 139/53 (!) 146/68 (!) 142/66  Pulse: 74 62 62 63  Resp: '18 20 19 19  '$ Temp: 97.6 F (36.4 C) 97.8 F (36.6 C) 98.1 F (36.7 C) 97.9 F (36.6 C)  TempSrc:      SpO2: 96% 96% 95% 95%  Weight:      Height:        Body mass index is 31.53 kg/m.  General exam: Pleasant, elderly Caucasian female.  Not in pain Skin: No rashes, lesions or ulcers. HEENT: Atraumatic, normocephalic, no obvious bleeding Lungs: Clear to auscultation bilaterally CVS: Regular rate and rhythm, no murmur GI/Abd soft, nontender, nondistended, bowel sound present CNS: Alert, awake, oriented x 3.   Psychiatry: Mood appropriate Extremities: No pedal edema, no calf tenderness  Follow ups:    Follow-up Information     Dion Body, MD Follow up.   Specialty: Family Medicine Contact information: [place] [address] [address] [phone]                 Discharge Instructions:   Discharge Instructions     Call MD for:  difficulty breathing, headache or visual disturbances   Complete by: As directed    Call MD for:  extreme fatigue   Complete by: As directed    Call MD for:  hives   Complete by: As directed    Call MD for:  persistant dizziness or light-headedness   Complete by: As directed    Call MD for:  persistant nausea and vomiting   Complete by: As directed    Call MD for:  severe uncontrolled  pain   Complete by: As directed    Call MD for:  temperature >100.4   Complete by: As directed    Diet general   Complete by: As directed    Discharge instructions   Complete by: As directed    Recommendations at discharge:   Ensure compliance with blood pressure meds.  Discharge instructions for diabetes mellitus: Check blood sugar 3 times a day and bedtime at home. If blood sugar running above 200 or less than 70 please call your MD to adjust insulin. If you notice signs and symptoms of hypoglycemia (low blood sugar) like jitteriness, confusion, thirst, tremor and sweating, please check blood sugar, drink sugary drink/biscuits/sweets to increase sugar level and call MD or return to ER.    General discharge instructions: Follow with Primary MD Dion Body, MD in 7 days  Please request your PCP  to go over your hospital tests, procedures, radiology results at the follow up. Please get your medicines reviewed and adjusted.  Your PCP may decide to repeat certain labs or tests as needed. Do not drive, operate heavy machinery,  perform activities at heights, swimming or participation in water activities or provide baby sitting services if your were admitted for syncope or siezures until you have seen by Primary MD or a Neurologist and advised to do so again. [place] Controlled Substance Reporting System database was reviewed. Do not drive, operate heavy machinery, perform activities at heights, swim, participate in water activities or provide baby-sitting services while on medications for pain, sleep and mood until your outpatient physician has reevaluated you and advised to do so again.  You are strongly recommended to comply with the dose, frequency and duration of prescribed medications. Activity: As tolerated with Full fall precautions use walker/cane & assistance as needed Avoid using any recreational substances like cigarette, tobacco, alcohol, or non-prescribed drug. If you  experience worsening of your admission symptoms, develop shortness of breath, life threatening emergency, suicidal or homicidal thoughts you must seek medical attention immediately by calling 911 or calling your MD immediately  if symptoms less severe. You must read complete instructions/literature along with all the possible adverse reactions/side effects for all the medicines you take and that have been prescribed to you. Take any new medicine only after you have completely understood and accepted all the possible adverse reactions/side effects.  Wear Seat belts while driving. You were cared for by a hospitalist during your hospital stay. If you have any questions about your discharge medications or the care you received while you were in the hospital after you are discharged, you can call the unit and ask to speak with the hospitalist or the covering physician. Once you are discharged, your primary care physician will handle any further medical issues. Please note that NO REFILLS for any discharge medications will be authorized once you are discharged, as it is imperative that you return to your primary care physician (or establish a relationship with a primary care physician if you do not have one).   Increase activity slowly   Complete by: As directed        Discharge Medications:   Allergies as of 09/06/2022       Reactions   Guaifenesin Shortness Of Breath   Lisinopril Shortness Of Breath, Palpitations   Tetanus Toxoids Anaphylaxis   Ciprofloxacin    Pt had muscle stiffness and pain in her knee while taking. Pt was having trouble walking.   Fluticasone    headaches   Imodium [loperamide] Itching   Lovastatin    Unknown reaction    Metronidazole    Pt had muscle stiffness and pain in her knee while taking. Pt was having trouble walking.   Paroxetine Nausea Only   Rosuvastatin Calcium Itching   Motrin [ibuprofen] Rash   Pt states, "It counteracts my Synthroid."        Medication  List     STOP taking these medications    Naphazoline-Glycerin 0.03-0.5 % Soln       TAKE these medications    acetaminophen 500 MG tablet Commonly known as: TYLENOL Take 500 mg by mouth daily as needed for moderate pain or headache.   aspirin EC 81 MG tablet Take 81 mg by mouth daily. Swallow whole.   cetirizine 10 MG tablet Commonly known as: ZYRTEC Take 10 mg by mouth daily.   clobetasol ointment 0.05 % Commonly known as: TEMOVATE Apply 1 Application topically 2 (two) times daily.   cyanocobalamin 1000 MCG tablet Take 1,000 mcg by mouth daily.   ezetimibe 10 MG tablet Commonly known as: ZETIA Take 10 mg by mouth every  evening.   guaiFENesin 600 MG 12 hr tablet Commonly known as: MUCINEX Take by mouth at bedtime.   losartan 100 MG tablet Commonly known as: COZAAR Take 1 tablet by mouth at bedtime. What changed: Another medication with the same name was removed. Continue taking this medication, and follow the directions you see here.   metFORMIN 500 MG 24 hr tablet Commonly known as: GLUCOPHAGE-XR Take 1 tablet (500 mg total) by mouth daily. Start taking on: September 08, 2022 What changed: These instructions start on September 08, 2022. If you are unsure what to do until then, ask your doctor or other care provider.   pantoprazole 20 MG tablet Commonly known as: PROTONIX Take 20 mg by mouth at bedtime.   predniSONE 10 MG tablet Commonly known as: DELTASONE Take 1 tablet (10 mg total) by mouth daily.   Synthroid 112 MCG tablet Generic drug: levothyroxine Take 112 mcg by mouth daily before breakfast.         The results of significant diagnostics from this hospitalization (including imaging, microbiology, ancillary and laboratory) are listed below for reference.    Procedures and Diagnostic Studies:   ECHOCARDIOGRAM COMPLETE BUBBLE STUDY  Result Date: 09/06/2022    ECHOCARDIOGRAM REPORT   Patient Name:   LAKRISTA SCADUTO Date of Exam: 09/06/2022  Medical Rec #:  [id number]          Height:       66.0 in Accession #:    [id number]         Weight:       195.3 lb Date of Birth:  November 23, 1950          BSA:          1.980 m Patient Age:    69 years           BP:           146/68 mmHg Patient Gender: F                  HR:           62 bpm. Exam Location:  [facility] Procedure: 2D Echo, Color Doppler, Cardiac Doppler and Saline Contrast Bubble            Study Indications:     Stroke 434.91 / I63.9  History:         Patient has no prior history of Echocardiogram examinations.                  Risk Factors:Hypertension and Dyslipidemia.  Sonographer:     Sherrie Sport Referring Phys:  [id number] [facility] Amit Leece Diagnosing Phys: Yolonda Kida MD  Sonographer Comments: Suboptimal apical window and no subcostal window. IMPRESSIONS  1. Left ventricular ejection fraction, by estimation, is 65 to 70%. The left ventricle has normal function. The left ventricle has no regional wall motion abnormalities. The left ventricular internal cavity size was mildly dilated. There is mild left ventricular hypertrophy. Left ventricular diastolic parameters are consistent with Grade I diastolic dysfunction (impaired relaxation).  2. Right ventricular systolic function is normal. The right ventricular size is normal.  3. The mitral valve is normal in structure. No evidence of mitral valve regurgitation.  4. The aortic valve is normal in structure. Aortic valve regurgitation is not visualized.  5. Agitated saline contrast bubble study was negative, with no evidence of any interatrial shunt. FINDINGS  Left Ventricle: Left ventricular ejection fraction, by estimation, is 65 to 70%. The left ventricle has normal function.  The left ventricle has no regional wall motion abnormalities. The left ventricular internal cavity size was mildly dilated. There is  mild left ventricular hypertrophy. Left ventricular diastolic parameters are consistent with Grade I diastolic dysfunction (impaired relaxation). Right  Ventricle: The right ventricular size is normal. No increase in right ventricular wall thickness. Right ventricular systolic function is normal. Left Atrium: Left atrial size was normal in size. Right Atrium: Right atrial size was normal in size. Pericardium: There is no evidence of pericardial effusion. Mitral Valve: The mitral valve is normal in structure. No evidence of mitral valve regurgitation. Tricuspid Valve: The tricuspid valve is normal in structure. Tricuspid valve regurgitation is trivial. Aortic Valve: The aortic valve is normal in structure. Aortic valve regurgitation is not visualized. Aortic valve mean gradient measures 3.5 mmHg. Aortic valve peak gradient measures 6.1 mmHg. Aortic valve area, by VTI measures 2.38 cm. Pulmonic Valve: The pulmonic valve was normal in structure. Pulmonic valve regurgitation is not visualized. Aorta: The ascending aorta was not well visualized. IAS/Shunts: No atrial level shunt detected by color flow Doppler. Agitated saline contrast was given intravenously to evaluate for intracardiac shunting. Agitated saline contrast bubble study was negative, with no evidence of any interatrial shunt.  LEFT VENTRICLE PLAX 2D LVIDd:         4.70 cm   Diastology LVIDs:         2.90 cm   LV e' medial:    6.96 cm/s LV PW:         1.50 cm   LV E/e' medial:  9.6 LV IVS:        1.00 cm   LV e' lateral:   6.53 cm/s LVOT diam:     2.10 cm   LV E/e' lateral: 10.3 LV SV:         64 LV SV Index:   32 LVOT Area:     3.46 cm  RIGHT VENTRICLE RV Basal diam:  2.90 cm RV Mid diam:    2.80 cm RV S prime:     9.79 cm/s LEFT ATRIUM             Index        RIGHT ATRIUM           Index LA diam:        2.40 cm 1.21 cm/m   RA Area:     14.90 cm LA Vol (A2C):   24.1 ml 12.17 ml/m  RA Volume:   36.60 ml  18.49 ml/m LA Vol (A4C):   25.6 ml 12.93 ml/m LA Biplane Vol: 26.5 ml 13.39 ml/m  AORTIC VALVE AV Area (Vmax):    1.98 cm AV Area (Vmean):   2.05 cm AV Area (VTI):     2.38 cm AV Vmax:            123.00 cm/s AV Vmean:          85.300 cm/s AV VTI:            0.268 m AV Peak Grad:      6.1 mmHg AV Mean Grad:      3.5 mmHg LVOT Vmax:         70.30 cm/s LVOT Vmean:        50.600 cm/s LVOT VTI:          0.184 m LVOT/AV VTI ratio: 0.69  AORTA Ao Root diam: 3.00 cm MITRAL VALVE  TRICUSPID VALVE MV Area (PHT): 2.45 cm    TR Peak grad:   13.0 mmHg MV Decel Time: 310 msec    TR Vmax:        180.00 cm/s MV E velocity: 67.10 cm/s MV A velocity: 85.40 cm/s  SHUNTS MV E/A ratio:  0.79        Systemic VTI:  0.18 m                            Systemic Diam: 2.10 cm Yolonda Kida MD Electronically signed by Yolonda Kida MD Signature Date/Time: 09/06/2022/12:34:44 PM    Final      Labs:   Basic Metabolic Panel: Recent Labs  Lab 09/05/22 1234 09/06/22 0219  NA 140 140  K 3.7 3.5  CL 105 106  CO2 24 25  GLUCOSE 123* 125*  BUN 16 17  CREATININE 0.76 0.76  CALCIUM 8.5* 8.0*   GFR Estimated Creatinine Clearance: 70.2 mL/min (by C-G formula based on SCr of 0.76 mg/dL). Liver Function Tests: Recent Labs  Lab 09/05/22 1234  AST 21  ALT 20  ALKPHOS 90  BILITOT 0.8  PROT 7.1  ALBUMIN 3.8   No results for input(s): "LIPASE", "AMYLASE" in the last 168 hours. No results for input(s): "AMMONIA" in the last 168 hours. Coagulation profile Recent Labs  Lab 09/05/22 1234  INR 1.0    CBC: Recent Labs  Lab 09/05/22 1234 09/06/22 0219  WBC 6.9 7.6  NEUTROABS 4.4  --   HGB 13.6 12.5  HCT 42.5 39.0  MCV 86.0 86.1  PLT 336 296   Cardiac Enzymes: No results for input(s): "CKTOTAL", "CKMB", "CKMBINDEX", "TROPONINI" in the last 168 hours. BNP: Invalid input(s): "POCBNP" CBG: Recent Labs  Lab 09/05/22 1230 09/05/22 1659 09/05/22 1949 09/06/22 0800 09/06/22 1130  GLUCAP 140* 108* 127* 136* 125*   D-Dimer No results for input(s): "DDIMER" in the last 72 hours. Hgb A1c Recent Labs    09/05/22 1234  HGBA1C 6.6*   Lipid Profile Recent Labs    09/06/22 0219   CHOL 214*  HDL 58  LDLCALC 141*  TRIG 75  CHOLHDL 3.7   Thyroid function studies Recent Labs    09/05/22 1234  TSH 1.330   Anemia work up No results for input(s): "VITAMINB12", "FOLATE", "FERRITIN", "TIBC", "IRON", "RETICCTPCT" in the last 72 hours. Microbiology No results found for this or any previous visit (from the past 240 hour(s)).  Time coordinating discharge: 35 minutes  Signed: Keala Drum  [facility] 09/06/2022, 2:08 PM

## 2022-09-06 NOTE — Progress Notes (Signed)
Subjective: States that her speech and left sided numbness are now resolved.   Objective: Current vital signs: BP (!) 142/66 (BP Location: Right Arm)   Pulse 63   Temp 97.9 F (36.6 C)   Resp 19   Ht '5\' 6"'$  (1.676 m)   Wt 88.6 kg   SpO2 95%   BMI 31.53 kg/m  Vital signs in last 24 hours: Temp:  [97.6 F (36.4 C)-98.1 F (36.7 C)] 97.9 F (36.6 C) (02/07 1129) Pulse Rate:  [61-91] 63 (02/07 1129) Resp:  [13-22] 19 (02/07 1129) BP: (132-175)/(53-126) 142/66 (02/07 1129) SpO2:  [95 %-100 %] 95 % (02/07 1129) Weight:  [88.6 kg-88.8 kg] 88.6 kg (02/06 1658)  Intake/Output from previous day: 02/06 0701 - 02/07 0700 In: 4 [I.V.:4] Out: -  Intake/Output this shift: No intake/output data recorded. Nutritional status:  Diet Order             Diet heart healthy/carb modified Room service appropriate? Yes; Fluid consistency: Thin  Diet effective now                   Physical Exam HEENT-  [place]/AT   Lungs- Respirations unlabored Extremities- Warm and well-perfused  Neurological Examination Mental Status: Awake and alert. Fully oriented. Thought content appropriate.  Speech fluent without evidence of aphasia.  Able to follow all commands without difficulty. No dysarthria. Cranial Nerves: II: Temporal visual fields intact with no extinction to DSS.  III,IV, VI: No ptosis. EOMI. No nystagmus. V: Temp sensation equal bilaterally VII: Decreased NL fold and length of labial fissure on the right (chronic, secondary to prior Bell's palsy)  VIII: Hearing intact to voice IX,X: Hypophonia is resolved XI: Symmetric XII: Midline tongue extension Motor: RUE: 5/5 RLE: 5/5 LUE: 5/5 LLE: 5/5 Sensory: FT intact x 4. No extinction to DSS. Deep Tendon Reflexes: 2+ and symmetric Cerebellar: No ataxia with FNF bilaterally Gait: Deferred   Lab Results: Results for orders placed or performed during the hospital encounter of 09/05/22 (from the past 48 hour(s))  CBG monitoring, ED      Status: Abnormal   Collection Time: 09/05/22 12:30 PM  Result Value Ref Range   Glucose-Capillary 140 (H) 70 - 99 mg/dL    Comment: Glucose reference range applies only to samples taken after fasting for at least 8 hours.  Protime-INR     Status: None   Collection Time: 09/05/22 12:34 PM  Result Value Ref Range   Prothrombin Time 13.1 11.4 - 15.2 seconds   INR 1.0 0.8 - 1.2    Comment: (NOTE) INR goal varies based on device and disease states. Performed at [facility], [address] [place], [place] [address]   APTT     Status: None   Collection Time: 09/05/22 12:34 PM  Result Value Ref Range   aPTT 29 24 - 36 seconds    Comment: Performed at [facility], [address] [place], [place] [address]  CBC     Status: None   Collection Time: 09/05/22 12:34 PM  Result Value Ref Range   WBC 6.9 4.0 - 10.5 K/uL   RBC 4.94 3.87 - 5.11 MIL/uL   Hemoglobin 13.6 12.0 - 15.0 g/dL   HCT 42.5 36.0 - 46.0 %   MCV 86.0 80.0 - 100.0 fL   MCH 27.5 26.0 - 34.0 pg   MCHC 32.0 30.0 - 36.0 g/dL   RDW 13.9 11.5 - 15.5 %   Platelets 336 150 - 400 K/uL  nRBC 0.0 0.0 - 0.2 %    Comment: Performed at [facility], [address] [place], [place] [address]  Differential     Status: None   Collection Time: 09/05/22 12:34 PM  Result Value Ref Range   Neutrophils Relative % 64 %   Neutro Abs 4.4 1.7 - 7.7 K/uL   Lymphocytes Relative 28 %   Lymphs Abs 1.9 0.7 - 4.0 K/uL   Monocytes Relative 6 %   Monocytes Absolute 0.4 0.1 - 1.0 K/uL   Eosinophils Relative 1 %   Eosinophils Absolute 0.1 0.0 - 0.5 K/uL   Basophils Relative 1 %   Basophils Absolute 0.1 0.0 - 0.1 K/uL   Immature Granulocytes 0 %   Abs Immature Granulocytes 0.02 0.00 - 0.07 K/uL    Comment: Performed at [facility], [address] [place], [place] [address]  Comprehensive metabolic panel     Status: Abnormal   Collection Time: 09/05/22 12:34 PM  Result Value Ref Range   Sodium 140 135  - 145 mmol/L   Potassium 3.7 3.5 - 5.1 mmol/L   Chloride 105 98 - 111 mmol/L   CO2 24 22 - 32 mmol/L   Glucose, Bld 123 (H) 70 - 99 mg/dL    Comment: Glucose reference range applies only to samples taken after fasting for at least 8 hours.   BUN 16 8 - 23 mg/dL   Creatinine, Ser 0.76 0.44 - 1.00 mg/dL   Calcium 8.5 (L) 8.9 - 10.3 mg/dL   Total Protein 7.1 6.5 - 8.1 g/dL   Albumin 3.8 3.5 - 5.0 g/dL   AST 21 15 - 41 U/L   ALT 20 0 - 44 U/L   Alkaline Phosphatase 90 38 - 126 U/L   Total Bilirubin 0.8 0.3 - 1.2 mg/dL   GFR, Estimated >60 >60 mL/min    Comment: (NOTE) Calculated using the CKD-EPI Creatinine Equation (2021)    Anion gap 11 5 - 15    Comment: Performed at [facility], [address], [place], [place] [address]  Ethanol     Status: None   Collection Time: 09/05/22 12:34 PM  Result Value Ref Range   Alcohol, Ethyl (B) <10 <10 mg/dL    Comment: (NOTE) Lowest detectable limit for serum alcohol is 10 mg/dL.  For medical purposes only. Performed at [facility], [address] [place], [place] [address]   Hemoglobin A1c     Status: Abnormal   Collection Time: 09/05/22 12:34 PM  Result Value Ref Range   Hgb A1c MFr Bld 6.6 (H) 4.8 - 5.6 %    Comment: (NOTE) Pre diabetes:          5.7%-6.4%  Diabetes:              >6.4%  Glycemic control for   <7.0% adults with diabetes    Mean Plasma Glucose 142.72 mg/dL    Comment: Performed at [place] [address] [place], [place] [address]  TSH     Status: None   Collection Time: 09/05/22 12:34 PM  Result Value Ref Range   TSH 1.330 0.350 - 4.500 uIU/mL    Comment: Performed by a 3rd Generation assay with a functional sensitivity of <=0.01 uIU/mL. Performed at [facility], [address] [place],  [address]   Glucose, capillary     Status: Abnormal   Collection Time: 09/05/22  4:59 PM  Result Value Ref Range   Glucose-Capillary 108 (  H) 70 - 99 mg/dL    Comment:  Glucose reference range applies only to samples taken after fasting for at least 8 hours.  Glucose, capillary     Status: Abnormal   Collection Time: 09/05/22  7:49 PM  Result Value Ref Range   Glucose-Capillary 127 (H) 70 - 99 mg/dL    Comment: Glucose reference range applies only to samples taken after fasting for at least 8 hours.  Basic metabolic panel     Status: Abnormal   Collection Time: 09/06/22  2:19 AM  Result Value Ref Range   Sodium 140 135 - 145 mmol/L   Potassium 3.5 3.5 - 5.1 mmol/L   Chloride 106 98 - 111 mmol/L   CO2 25 22 - 32 mmol/L   Glucose, Bld 125 (H) 70 - 99 mg/dL    Comment: Glucose reference range applies only to samples taken after fasting for at least 8 hours.   BUN 17 8 - 23 mg/dL   Creatinine, Ser 0.76 0.44 - 1.00 mg/dL   Calcium 8.0 (L) 8.9 - 10.3 mg/dL   GFR, Estimated >60 >60 mL/min    Comment: (NOTE) Calculated using the CKD-EPI Creatinine Equation (2021)    Anion gap 9 5 - 15    Comment: Performed at [facility], [address] [place], [place] [address]  CBC     Status: None   Collection Time: 09/06/22  2:19 AM  Result Value Ref Range   WBC 7.6 4.0 - 10.5 K/uL   RBC 4.53 3.87 - 5.11 MIL/uL   Hemoglobin 12.5 12.0 - 15.0 g/dL   HCT 39.0 36.0 - 46.0 %   MCV 86.1 80.0 - 100.0 fL   MCH 27.6 26.0 - 34.0 pg   MCHC 32.1 30.0 - 36.0 g/dL   RDW 14.0 11.5 - 15.5 %   Platelets 296 150 - 400 K/uL   nRBC 0.0 0.0 - 0.2 %    Comment: Performed at [facility], [address] [place], [place] [address]  Lipid panel     Status: Abnormal   Collection Time: 09/06/22  2:19 AM  Result Value Ref Range   Cholesterol 214 (H) 0 - 200 mg/dL   Triglycerides 75 <150 mg/dL   HDL 58 >40 mg/dL   Total CHOL/HDL Ratio 3.7 RATIO   VLDL 15 0 - 40 mg/dL   LDL Cholesterol 141 (H) 0 - 99 mg/dL    Comment:        Total Cholesterol/HDL:CHD Risk Coronary Heart Disease Risk Table                     Men   Women  1/2 Average Risk   3.4   3.3   Average Risk       5.0   4.4  2 X Average Risk   9.6   7.1  3 X Average Risk  23.4   11.0        Use the calculated Patient Ratio above and the CHD Risk Table to determine the patient's CHD Risk.        ATP III CLASSIFICATION (LDL):  <100     mg/dL   Optimal  100-129  mg/dL   Near or Above                    Optimal  130-159  mg/dL   Borderline  160-189  mg/dL   High  >190     mg/dL   Very  High Performed at [facility], [address] [place], [place] [address]   Glucose, capillary     Status: Abnormal   Collection Time: 09/06/22  8:00 AM  Result Value Ref Range   Glucose-Capillary 136 (H) 70 - 99 mg/dL    Comment: Glucose reference range applies only to samples taken after fasting for at least 8 hours.  Glucose, capillary     Status: Abnormal   Collection Time: 09/06/22 11:30 AM  Result Value Ref Range   Glucose-Capillary 125 (H) 70 - 99 mg/dL    Comment: Glucose reference range applies only to samples taken after fasting for at least 8 hours.    No results found for this or any previous visit (from the past 240 hour(s)).  Lipid Panel Recent Labs    09/06/22 0219  CHOL 214*  TRIG 75  HDL 58  CHOLHDL 3.7  VLDL 15  LDLCALC 141*    Studies/Results: ECHOCARDIOGRAM COMPLETE BUBBLE STUDY  Result Date: 09/06/2022    ECHOCARDIOGRAM REPORT   Patient Name:   KASIYA BURCK Date of Exam: 09/06/2022 Medical Rec #:  [id number]          Height:       66.0 in Accession #:    [id number]         Weight:       195.3 lb Date of Birth:  [birth year]          BSA:          1.980 m Patient Age:    26 years           BP:           146/68 mmHg Patient Gender: F                  HR:           62 bpm. Exam Location:  [facility] Procedure: 2D Echo, Color Doppler, Cardiac Doppler and Saline Contrast Bubble            Study Indications:     Stroke 434.91 / I63.9  History:         Patient has no prior history of Echocardiogram examinations.                  Risk Factors:Hypertension and  Dyslipidemia.  Sonographer:     Sherrie Sport Referring Phys:  [id number] [facility] DAHAL Diagnosing Phys: Yolonda Kida MD  Sonographer Comments: Suboptimal apical window and no subcostal window. IMPRESSIONS  1. Left ventricular ejection fraction, by estimation, is 65 to 70%. The left ventricle has normal function. The left ventricle has no regional wall motion abnormalities. The left ventricular internal cavity size was mildly dilated. There is mild left ventricular hypertrophy. Left ventricular diastolic parameters are consistent with Grade I diastolic dysfunction (impaired relaxation).  2. Right ventricular systolic function is normal. The right ventricular size is normal.  3. The mitral valve is normal in structure. No evidence of mitral valve regurgitation.  4. The aortic valve is normal in structure. Aortic valve regurgitation is not visualized.  5. Agitated saline contrast bubble study was negative, with no evidence of any interatrial shunt. FINDINGS  Left Ventricle: Left ventricular ejection fraction, by estimation, is 65 to 70%. The left ventricle has normal function. The left ventricle has no regional wall motion abnormalities. The left ventricular internal cavity size was mildly dilated. There is  mild left ventricular hypertrophy. Left ventricular diastolic parameters are consistent with Grade I diastolic  dysfunction (impaired relaxation). Right Ventricle: The right ventricular size is normal. No increase in right ventricular wall thickness. Right ventricular systolic function is normal. Left Atrium: Left atrial size was normal in size. Right Atrium: Right atrial size was normal in size. Pericardium: There is no evidence of pericardial effusion. Mitral Valve: The mitral valve is normal in structure. No evidence of mitral valve regurgitation. Tricuspid Valve: The tricuspid valve is normal in structure. Tricuspid valve regurgitation is trivial. Aortic Valve: The aortic valve is normal in structure. Aortic  valve regurgitation is not visualized. Aortic valve mean gradient measures 3.5 mmHg. Aortic valve peak gradient measures 6.1 mmHg. Aortic valve area, by VTI measures 2.38 cm. Pulmonic Valve: The pulmonic valve was normal in structure. Pulmonic valve regurgitation is not visualized. Aorta: The ascending aorta was not well visualized. IAS/Shunts: No atrial level shunt detected by color flow Doppler. Agitated saline contrast was given intravenously to evaluate for intracardiac shunting. Agitated saline contrast bubble study was negative, with no evidence of any interatrial shunt.  LEFT VENTRICLE PLAX 2D LVIDd:         4.70 cm   Diastology LVIDs:         2.90 cm   LV e' medial:    6.96 cm/s LV PW:         1.50 cm   LV E/e' medial:  9.6 LV IVS:        1.00 cm   LV e' lateral:   6.53 cm/s LVOT diam:     2.10 cm   LV E/e' lateral: 10.3 LV SV:         64 LV SV Index:   32 LVOT Area:     3.46 cm  RIGHT VENTRICLE RV Basal diam:  2.90 cm RV Mid diam:    2.80 cm RV S prime:     9.79 cm/s LEFT ATRIUM             Index        RIGHT ATRIUM           Index LA diam:        2.40 cm 1.21 cm/m   RA Area:     14.90 cm LA Vol (A2C):   24.1 ml 12.17 ml/m  RA Volume:   36.60 ml  18.49 ml/m LA Vol (A4C):   25.6 ml 12.93 ml/m LA Biplane Vol: 26.5 ml 13.39 ml/m  AORTIC VALVE AV Area (Vmax):    1.98 cm AV Area (Vmean):   2.05 cm AV Area (VTI):     2.38 cm AV Vmax:           123.00 cm/s AV Vmean:          85.300 cm/s AV VTI:            0.268 m AV Peak Grad:      6.1 mmHg AV Mean Grad:      3.5 mmHg LVOT Vmax:         70.30 cm/s LVOT Vmean:        50.600 cm/s LVOT VTI:          0.184 m LVOT/AV VTI ratio: 0.69  AORTA Ao Root diam: 3.00 cm MITRAL VALVE               TRICUSPID VALVE MV Area (PHT): 2.45 cm    TR Peak grad:   13.0 mmHg MV Decel Time: 310 msec    TR Vmax:        180.00 cm/s  MV E velocity: 67.10 cm/s MV A velocity: 85.40 cm/s  SHUNTS MV E/A ratio:  0.79        Systemic VTI:  0.18 m                            Systemic Diam:  2.10 cm Yolonda Kida MD Electronically signed by Yolonda Kida MD Signature Date/Time: 09/06/2022/12:34:44 PM    Final    MR BRAIN WO CONTRAST  Result Date: 09/05/2022 CLINICAL DATA:  Initial evaluation for neuro deficit, stroke suspected. EXAM: MRI HEAD WITHOUT CONTRAST MRA HEAD WITHOUT CONTRAST MRA NECK WITHOUT CONTRAST TECHNIQUE: Multiplanar, multiecho pulse sequences of the brain and surrounding structures were obtained without intravenous contrast. Angiographic images of the Circle of Willis were obtained using MRA technique without intravenous contrast. Angiographic images of the neck were obtained using MRA technique without intravenous contrast. Carotid stenosis measurements (when applicable) are obtained utilizing NASCET criteria, using the distal internal carotid diameter as the denominator. COMPARISON:  Prior CT from earlier the same day. FINDINGS: MRI HEAD FINDINGS Brain: Cerebral volume within normal limits for age. Patchy T2/FLAIR hyperintensity involving the periventricular and deep white matter both cerebral hemispheres, most characteristic of chronic microvascular ischemic disease, moderately advanced in nature. No evidence for acute or subacute ischemia. Gray-white matter differentiation maintained. No rows of chronic cortical infarction. No acute or chronic intracranial blood products. No mass lesion, midline shift or mass effect. No hydrocephalus or extra-axial fluid collection. Pituitary gland and suprasellar region within normal limits. Vascular: Major intracranial vascular flow voids are maintained. Skull and upper cervical spine: Craniocervical junction within normal limits. Bone marrow signal intensity normal. No scalp soft tissue abnormality. Sinuses/Orbits: Prior bilateral ocular lens replacement. Paranasal sinuses are largely clear. Trace left mastoid effusion noted, of doubtful significance. Other: None. MRA HEAD FINDINGS ANTERIOR CIRCULATION: Both internal carotid arteries are  widely patent to the termini without stenosis or other abnormality. A1 segments patent bilaterally. Normal anterior communicating artery complex. Both ACAs widely patent without stenosis. No M1 stenosis or occlusion. No proximal MCA branch occlusion or high-grade stenosis. Distal MCA branches perfused and symmetric. POSTERIOR CIRCULATION: Both V4 segments patent without stenosis. Left vertebral artery dominant. Right PICA patent. Left PICA origin not seen. Basilar widely patent without stenosis. Superior cerebellar arteries patent bilaterally. Both PCAs supplied via the basilar. Moderate multifocal segmental stenoses noted about the P2 segments bilaterally. Both PCAs remain patent to their distal aspects. No intracranial aneurysm. MRA NECK FINDINGS AORTIC ARCH: Partially visualized aortic arch within normal limits for caliber. Bovine branching pattern noted at the arch. No visible stenosis about the origin the great vessels. RIGHT CAROTID SYSTEM: Right common and internal carotid arteries are mildly tortuous without evidence for dissection. No hemodynamically significant greater than 50% stenosis about the right carotid bulb. LEFT CAROTID SYSTEM: Left common and internal carotid arteries are mildly tortuous without evidence for dissection. No hemodynamically significant greater than 50% stenosis about the left carotid bulb. VERTEBRAL ARTERIES: Both vertebral arteries arise from the subclavian arteries. No visible proximal subclavian artery stenosis. Visualized vertebral arteries are widely patent without stenosis or dissection. Left vertebral artery dominant. IMPRESSION: MRI HEAD IMPRESSION: 1. No acute intracranial infarct or other abnormality. 2. Moderately advanced chronic microvascular ischemic disease. MRA HEAD IMPRESSION: 1. Negative intracranial MRA for large vessel occlusion. 2. 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 3. Otherwise wide patency of the major intracranial  arterial circulation. No other hemodynamically significant  or correctable stenosis. MRA NECK IMPRESSION: 1. Negative MRA of the neck for large vessel occlusion or other emergent finding. 2. No hemodynamically significant stenosis about either carotid artery system. 3. Wide patency of both vertebral arteries within the neck. Left vertebral artery dominant. Electronically Signed   By: Jeannine Boga M.D.   On: 09/05/2022 18:55   MR ANGIO HEAD WO CONTRAST  Result Date: 09/05/2022 CLINICAL DATA:  Initial evaluation for neuro deficit, stroke suspected. EXAM: MRI HEAD WITHOUT CONTRAST MRA HEAD WITHOUT CONTRAST MRA NECK WITHOUT CONTRAST TECHNIQUE: Multiplanar, multiecho pulse sequences of the brain and surrounding structures were obtained without intravenous contrast. Angiographic images of the Circle of Willis were obtained using MRA technique without intravenous contrast. Angiographic images of the neck were obtained using MRA technique without intravenous contrast. Carotid stenosis measurements (when applicable) are obtained utilizing NASCET criteria, using the distal internal carotid diameter as the denominator. COMPARISON:  Prior CT from earlier the same day. FINDINGS: MRI HEAD FINDINGS Brain: Cerebral volume within normal limits for age. Patchy T2/FLAIR hyperintensity involving the periventricular and deep white matter both cerebral hemispheres, most characteristic of chronic microvascular ischemic disease, moderately advanced in nature. No evidence for acute or subacute ischemia. Gray-white matter differentiation maintained. No rows of chronic cortical infarction. No acute or chronic intracranial blood products. No mass lesion, midline shift or mass effect. No hydrocephalus or extra-axial fluid collection. Pituitary gland and suprasellar region within normal limits. Vascular: Major intracranial vascular flow voids are maintained. Skull and upper cervical spine: Craniocervical junction within normal limits.  Bone marrow signal intensity normal. No scalp soft tissue abnormality. Sinuses/Orbits: Prior bilateral ocular lens replacement. Paranasal sinuses are largely clear. Trace left mastoid effusion noted, of doubtful significance. Other: None. MRA HEAD FINDINGS ANTERIOR CIRCULATION: Both internal carotid arteries are widely patent to the termini without stenosis or other abnormality. A1 segments patent bilaterally. Normal anterior communicating artery complex. Both ACAs widely patent without stenosis. No M1 stenosis or occlusion. No proximal MCA branch occlusion or high-grade stenosis. Distal MCA branches perfused and symmetric. POSTERIOR CIRCULATION: Both V4 segments patent without stenosis. Left vertebral artery dominant. Right PICA patent. Left PICA origin not seen. Basilar widely patent without stenosis. Superior cerebellar arteries patent bilaterally. Both PCAs supplied via the basilar. Moderate multifocal segmental stenoses noted about the P2 segments bilaterally. Both PCAs remain patent to their distal aspects. No intracranial aneurysm. MRA NECK FINDINGS AORTIC ARCH: Partially visualized aortic arch within normal limits for caliber. Bovine branching pattern noted at the arch. No visible stenosis about the origin the great vessels. RIGHT CAROTID SYSTEM: Right common and internal carotid arteries are mildly tortuous without evidence for dissection. No hemodynamically significant greater than 50% stenosis about the right carotid bulb. LEFT CAROTID SYSTEM: Left common and internal carotid arteries are mildly tortuous without evidence for dissection. No hemodynamically significant greater than 50% stenosis about the left carotid bulb. VERTEBRAL ARTERIES: Both vertebral arteries arise from the subclavian arteries. No visible proximal subclavian artery stenosis. Visualized vertebral arteries are widely patent without stenosis or dissection. Left vertebral artery dominant. IMPRESSION: MRI HEAD IMPRESSION: 1. No acute  intracranial infarct or other abnormality. 2. Moderately advanced chronic microvascular ischemic disease. MRA HEAD IMPRESSION: 1. Negative intracranial MRA for large vessel occlusion. 2. 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 3. Otherwise wide patency of the major intracranial arterial circulation. No other hemodynamically significant or correctable stenosis. MRA NECK IMPRESSION: 1. Negative MRA of the neck for large vessel occlusion or other emergent finding. 2. No  hemodynamically significant stenosis about either carotid artery system. 3. Wide patency of both vertebral arteries within the neck. Left vertebral artery dominant. Electronically Signed   By: Jeannine Boga M.D.   On: 09/05/2022 18:55   MR ANGIO NECK WO CONTRAST  Result Date: 09/05/2022 CLINICAL DATA:  Initial evaluation for neuro deficit, stroke suspected. EXAM: MRI HEAD WITHOUT CONTRAST MRA HEAD WITHOUT CONTRAST MRA NECK WITHOUT CONTRAST TECHNIQUE: Multiplanar, multiecho pulse sequences of the brain and surrounding structures were obtained without intravenous contrast. Angiographic images of the Circle of Willis were obtained using MRA technique without intravenous contrast. Angiographic images of the neck were obtained using MRA technique without intravenous contrast. Carotid stenosis measurements (when applicable) are obtained utilizing NASCET criteria, using the distal internal carotid diameter as the denominator. COMPARISON:  Prior CT from earlier the same day. FINDINGS: MRI HEAD FINDINGS Brain: Cerebral volume within normal limits for age. Patchy T2/FLAIR hyperintensity involving the periventricular and deep white matter both cerebral hemispheres, most characteristic of chronic microvascular ischemic disease, moderately advanced in nature. No evidence for acute or subacute ischemia. Gray-white matter differentiation maintained. No rows of chronic cortical infarction. No acute or chronic intracranial  blood products. No mass lesion, midline shift or mass effect. No hydrocephalus or extra-axial fluid collection. Pituitary gland and suprasellar region within normal limits. Vascular: Major intracranial vascular flow voids are maintained. Skull and upper cervical spine: Craniocervical junction within normal limits. Bone marrow signal intensity normal. No scalp soft tissue abnormality. Sinuses/Orbits: Prior bilateral ocular lens replacement. Paranasal sinuses are largely clear. Trace left mastoid effusion noted, of doubtful significance. Other: None. MRA HEAD FINDINGS ANTERIOR CIRCULATION: Both internal carotid arteries are widely patent to the termini without stenosis or other abnormality. A1 segments patent bilaterally. Normal anterior communicating artery complex. Both ACAs widely patent without stenosis. No M1 stenosis or occlusion. No proximal MCA branch occlusion or high-grade stenosis. Distal MCA branches perfused and symmetric. POSTERIOR CIRCULATION: Both V4 segments patent without stenosis. Left vertebral artery dominant. Right PICA patent. Left PICA origin not seen. Basilar widely patent without stenosis. Superior cerebellar arteries patent bilaterally. Both PCAs supplied via the basilar. Moderate multifocal segmental stenoses noted about the P2 segments bilaterally. Both PCAs remain patent to their distal aspects. No intracranial aneurysm. MRA NECK FINDINGS AORTIC ARCH: Partially visualized aortic arch within normal limits for caliber. Bovine branching pattern noted at the arch. No visible stenosis about the origin the great vessels. RIGHT CAROTID SYSTEM: Right common and internal carotid arteries are mildly tortuous without evidence for dissection. No hemodynamically significant greater than 50% stenosis about the right carotid bulb. LEFT CAROTID SYSTEM: Left common and internal carotid arteries are mildly tortuous without evidence for dissection. No hemodynamically significant greater than 50% stenosis  about the left carotid bulb. VERTEBRAL ARTERIES: Both vertebral arteries arise from the subclavian arteries. No visible proximal subclavian artery stenosis. Visualized vertebral arteries are widely patent without stenosis or dissection. Left vertebral artery dominant. IMPRESSION: MRI HEAD IMPRESSION: 1. No acute intracranial infarct or other abnormality. 2. Moderately advanced chronic microvascular ischemic disease. MRA HEAD IMPRESSION: 1. Negative intracranial MRA for large vessel occlusion. 2. 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 3. Otherwise wide patency of the major intracranial arterial circulation. No other hemodynamically significant or correctable stenosis. MRA NECK IMPRESSION: 1. Negative MRA of the neck for large vessel occlusion or other emergent finding. 2. No hemodynamically significant stenosis about either carotid artery system. 3. Wide patency of both vertebral arteries within the neck. Left vertebral artery dominant.  Electronically Signed   By: Jeannine Boga M.D.   On: 09/05/2022 18:55   CT HEAD CODE STROKE WO CONTRAST  Result Date: 09/05/2022 CLINICAL DATA:  Code stroke. Neuro deficit, acute, stroke suspected. EXAM: CT HEAD WITHOUT CONTRAST TECHNIQUE: Contiguous axial images were obtained from the base of the skull through the vertex without intravenous contrast. RADIATION DOSE REDUCTION: This exam was performed according to the departmental dose-optimization program which includes automated exposure control, adjustment of the mA and/or kV according to patient size and/or use of iterative reconstruction technique. COMPARISON:  09/19/2019 FINDINGS: Brain: There are moderate chronic small-vessel ischemic changes of the cerebral hemispheric white matter. No sign of acute infarction, mass lesion, hemorrhage, hydrocephalus or extra-axial collection Vascular: No evidence of acute hyperdense vessel. There is atherosclerotic calcification of the major  vessels at the base of the brain. Skull: Negative Sinuses/Orbits: Clear/normal Other: None ASPECTS ([place] Stroke Program Early CT Score) - Ganglionic level infarction (caudate, lentiform nuclei, internal capsule, insula, M1-M3 cortex): 7 - Supraganglionic infarction (M4-M6 cortex): 3 Total score (0-10 with 10 being normal): 10 IMPRESSION: 1. No acute CT finding. Moderate chronic small-vessel ischemic changes of the cerebral hemispheric white matter. 2. Aspects is 10. These results were communicated to Dr. Cheral Marker at 12:50 pm on 09/05/2022 by text page via the [facility] messaging system. Electronically Signed   By: Nelson Chimes M.D.   On: 09/05/2022 12:51    Medications: Scheduled:  aspirin EC  81 mg Oral Daily   enoxaparin (LOVENOX) injection  0.5 mg/kg Subcutaneous Q24H   ezetimibe  10 mg Oral QPM   influenza vaccine adjuvanted  0.5 mL Intramuscular Tomorrow-1000   insulin aspart  0-5 Units Subcutaneous QHS   insulin aspart  0-9 Units Subcutaneous TID WC   levothyroxine  112 mcg Oral Q0600   losartan  100 mg Oral Daily   pantoprazole  20 mg Oral QHS   sodium chloride flush  3 mL Intravenous Once    Assessment: 72 year old female presenting with a 3-day history of left sided headache and periorbital pain followed by new onset of dysarthria and left sided numbness at 0930 today. Initial NIHSS on arrival to the ED was 6.  - Exam reveals chronic sequelae of prior right Bell's palsy. No limb weakness, numbness or ataxia. Hypophonia noted yesterday is now resolved.  - Imaging studies: - CT head: No acute CT finding. Moderate chronic small-vessel ischemic changes of the cerebral hemispheric white matter. Aspects is 10. - MRI brain: No acute intracranial infarct or other abnormality. Moderately advanced chronic microvascular ischemic disease. - MRA head: Moderate multifocal segmental stenoses involving the P2 segments bilaterally. Otherwise wide patency of the major intracranial arterial circulation. No  other hemodynamically significant or correctable stenosis. - MRA neck: No hemodynamically significant stenosis about either carotid artery system. Wide patency of both vertebral arteries within the neck. Left vertebral artery dominant. - EKG: Normal sinus rhythm; Incomplete right bundle branch block; Cannot rule out Anterior infarct, age undetermined. - TTE: LVEF 65 to 70%. The left ventricle has normal function. The left ventricle has no regional  wall motion abnormalities. The left ventricular internal cavity size was mildly dilated. There is mild left  ventricular hypertrophy. Left ventricular diastolic parameters are consistent with Grade I diastolic dysfunction (impaired relaxation). No mural thrombus or valvular vegetation mentioned in the report.  - HgbA1c elevated at 6.6 - Fasting lipid panel: Elevated LDL and total cholesterol  - Most likely etiology for her presentation is felt to be hypertensive  urgency manifesting with neurological deficits. Her companion endorses that she may have been under some stress recently; anxiety-related symptoms are also a possible etiology for her presentation. Complicated migraine is also a consideration.     Recommendations: - Can be discharged home from a Neurology standpoint if cleared by PT.  - Advised to start a light exercise program consisting of 1 hour walking per day or a similar alternative - Advised to start keeping a daily BP diary - Continue ASA 81 mg po qd - Would start the patient on a statin. - Neurohospitalist service will sign off at this time. - Outpatient Neurology follow up.     LOS: 0 days   '@Electronically'$  signed: Dr. Kerney Elbe 09/06/2022  1:25 PM

## 2022-09-06 NOTE — Discharge Summary (Signed)
Physician Discharge Summary  Marne Meline WNI:[id number] DOB: [birth year] DOA: 09/05/2022  PCP: Dion Body, MD  Admit date: 09/05/2022 Discharge date: 09/06/2022  Admitted From: Home Discharge disposition: Home  Recommendations at discharge:  Ensure compliance with blood pressure meds.  Brief narrative: Rebecca Conner is a 72 y.o. female with PMH significant for DM2, HTN, HLD, Bell's palsy, GERD who presented to the ED today with complaint of strokelike symptoms.  Patient complained of mild headache for last few days. This morning, patient woke up fine but then started to have left-sided face, arm and leg numbness around 9:30 AM.  She presented to the 3 hours later ED on 12:30 PM.  Not on any blood thinners. She endorses inconsistent compliance to losartan but she is sure she took it last night.  In the ED, code stroke was called.  Patient was afebrile, heart rate in 70s, blood pressure was elevated to 218/90, breathing on room air. Labs with unremarkable CBC, BMP with glucose level elevated to 140, blood alcohol level not elevated CT scan of head did not show any acute finding.  It showed moderate chronic small-vessel ischemic changes of the cerebral hemispheric white matter Patient was seen by a neurologist Dr. Cheral Marker.  Differential diagnosis: Small acute stroke versus hypertensive encephalopathy Stroke workup was started For blood pressure control, patient was also started on clevidipine drip which significantly dropped her blood pressure down to 130s after which drip was stopped. Hospitalist service consulted for inpatient admission and management.  MRI brain did not show any acute intracranial infarct.  But it showed moderately advanced chronic microvascular ischemic disease. MRA head and neck were negative for any large vessel occlusion.  But it showed moderate multifocal segmental stenosis involving the P2 segments bilaterally.  Subjective: Patient was seen and  examined this morning.  Propped up in bed.  Feels better today.  Sister at bedside.  We discussed the MRI and MRI findings. Overnight, blood pressure in 150s mostly  Hospital course: TIA Chronic right Bell's palsy Presented with left-sided face, arm and leg numbness that started about 3 hours prior to presentation.  Code stroke was called.  TNK was not given Neurology consulted. Differential diagnosis: Small acute stroke versus hypertensive encephalopathy Stroke workup was started CT scan of head, MRI brain, MRA head and neck as above. Echocardiogram did not show any cardiac source of embolism or interatrial communication. Lipid panel with HDL 58, LDL 141, A1c 6.6 Given aspirin 325 mg 1 dose in the ED PTA on aspirin 81 mg daily.  Currently continued on the same per neurology recommendation. She has right facial weakness due to Bell's palsy several years ago.  Hypertensive encephalopathy Initial blood pressure elevated over 200.  Patient's presenting symptoms could have been aggravated by elevated blood pressure as well. For blood pressure control, patient was also started on clevidipine drip which significantly dropped her blood pressure down to 130s after which drip was stopped. PTA on losartan 100 mg daily.  Continue the same.  Encourage compliance.  Type 2 diabetes mellitus A1c 6.6 on 09/05/2022 PTA on metformin 500 mg daily.  Currently on hold.  Post discharge, can resume metformin starting 09/08/2022 Continue sliding-scale insulin with Accu-Cheks Recent Labs  Lab 09/05/22 1230 09/05/22 1659 09/05/22 1949 09/06/22 0800 09/06/22 1130  GLUCAP 140* 108* 127* 136* 125*    Hyperlipidemia Zetia.  Patient was recommended to start on statin.  But she states that she has allergies to statin in the past.  Hypothyroidism Continue Synthroid.  GERD PPI  Goals of care   Code Status: DNR.  Discussed with patient at bedside.  Patient has a clear understanding about  DNR/DNI.    Wounds:  - Incision (Closed) 05/02/18 Eye Right (Active)  Date First Assessed/Time First Assessed: 05/02/18 0846   Location: Eye  Location Orientation: Right    No assessment data to display     No associated orders.     Incision (Closed) 06/06/18 Eye Left (Active)  Date First Assessed/Time First Assessed: 06/06/18 0701   Location: Eye  Location Orientation: Left    No assessment data to display     No associated orders.    Discharge Exam:   Vitals:   09/06/22 0035 09/06/22 0431 09/06/22 0747 09/06/22 1129  BP: (!) 134/58 (!) 139/53 (!) 146/68 (!) 142/66  Pulse: 74 62 62 63  Resp: '18 20 19 19  '$ Temp: 97.6 F (36.4 C) 97.8 F (36.6 C) 98.1 F (36.7 C) 97.9 F (36.6 C)  TempSrc:      SpO2: 96% 96% 95% 95%  Weight:      Height:        Body mass index is 31.53 kg/m.  General exam: Pleasant, elderly Caucasian female.  Not in pain Skin: No rashes, lesions or ulcers. HEENT: Atraumatic, normocephalic, no obvious bleeding Lungs: Clear to auscultation bilaterally CVS: Regular rate and rhythm, no murmur GI/Abd soft, nontender, nondistended, bowel sound present CNS: Alert, awake, oriented x 3.   Psychiatry: Mood appropriate Extremities: No pedal edema, no calf tenderness  Follow ups:    Follow-up Information     Dion Body, MD Follow up.   Specialty: Family Medicine Contact information: [address] [place] [id number] [phone]                 Discharge Instructions:   Discharge Instructions     Call MD for:  difficulty breathing, headache or visual disturbances   Complete by: As directed    Call MD for:  extreme fatigue   Complete by: As directed    Call MD for:  hives   Complete by: As directed    Call MD for:  persistant dizziness or light-headedness   Complete by: As directed    Call MD for:  persistant nausea and vomiting   Complete by: As directed    Call MD for:  severe uncontrolled  pain   Complete by: As directed    Call MD for:  temperature >100.4   Complete by: As directed    Diet general   Complete by: As directed    Discharge instructions   Complete by: As directed    Recommendations at discharge:   Ensure compliance with blood pressure meds.  Discharge instructions for diabetes mellitus: Check blood sugar 3 times a day and bedtime at home. If blood sugar running above 200 or less than 70 please call your MD to adjust insulin. If you notice signs and symptoms of hypoglycemia (low blood sugar) like jitteriness, confusion, thirst, tremor and sweating, please check blood sugar, drink sugary drink/biscuits/sweets to increase sugar level and call MD or return to ER.    General discharge instructions: Follow with Primary MD Dion Body, MD in 7 days  Please request your PCP  to go over your hospital tests, procedures, radiology results at the follow up. Please get your medicines reviewed and adjusted.  Your PCP may decide to repeat certain labs or tests as needed. Do not drive, operate heavy machinery,  perform activities at heights, swimming or participation in water activities or provide baby sitting services if your were admitted for syncope or siezures until you have seen by Primary MD or a Neurologist and advised to do so again. [place] Controlled Substance Reporting System database was reviewed. Do not drive, operate heavy machinery, perform activities at heights, swim, participate in water activities or provide baby-sitting services while on medications for pain, sleep and mood until your outpatient physician has reevaluated you and advised to do so again.  You are strongly recommended to comply with the dose, frequency and duration of prescribed medications. Activity: As tolerated with Full fall precautions use walker/cane & assistance as needed Avoid using any recreational substances like cigarette, tobacco, alcohol, or non-prescribed drug. If you  experience worsening of your admission symptoms, develop shortness of breath, life threatening emergency, suicidal or homicidal thoughts you must seek medical attention immediately by calling 911 or calling your MD immediately  if symptoms less severe. You must read complete instructions/literature along with all the possible adverse reactions/side effects for all the medicines you take and that have been prescribed to you. Take any new medicine only after you have completely understood and accepted all the possible adverse reactions/side effects.  Wear Seat belts while driving. You were cared for by a hospitalist during your hospital stay. If you have any questions about your discharge medications or the care you received while you were in the hospital after you are discharged, you can call the unit and ask to speak with the hospitalist or the covering physician. Once you are discharged, your primary care physician will handle any further medical issues. Please note that NO REFILLS for any discharge medications will be authorized once you are discharged, as it is imperative that you return to your primary care physician (or establish a relationship with a primary care physician if you do not have one).   Increase activity slowly   Complete by: As directed        Discharge Medications:   Allergies as of 09/06/2022       Reactions   Guaifenesin Shortness Of Breath   Lisinopril Shortness Of Breath, Palpitations   Tetanus Toxoids Anaphylaxis   Ciprofloxacin    Pt had muscle stiffness and pain in her knee while taking. Pt was having trouble walking.   Fluticasone    headaches   Imodium [loperamide] Itching   Lovastatin    Unknown reaction    Metronidazole    Pt had muscle stiffness and pain in her knee while taking. Pt was having trouble walking.   Paroxetine Nausea Only   Rosuvastatin Calcium Itching   Motrin [ibuprofen] Rash   Pt states, "It counteracts my Synthroid."        Medication  List     STOP taking these medications    Naphazoline-Glycerin 0.03-0.5 % Soln       TAKE these medications    acetaminophen 500 MG tablet Commonly known as: TYLENOL Take 500 mg by mouth daily as needed for moderate pain or headache.   aspirin EC 81 MG tablet Take 81 mg by mouth daily. Swallow whole.   cetirizine 10 MG tablet Commonly known as: ZYRTEC Take 10 mg by mouth daily.   clobetasol ointment 0.05 % Commonly known as: TEMOVATE Apply 1 Application topically 2 (two) times daily.   cyanocobalamin 1000 MCG tablet Take 1,000 mcg by mouth daily.   ezetimibe 10 MG tablet Commonly known as: ZETIA Take 10 mg by mouth every  evening.   guaiFENesin 600 MG 12 hr tablet Commonly known as: MUCINEX Take by mouth at bedtime.   losartan 100 MG tablet Commonly known as: COZAAR Take 1 tablet by mouth at bedtime. What changed: Another medication with the same name was removed. Continue taking this medication, and follow the directions you see here.   metFORMIN 500 MG 24 hr tablet Commonly known as: GLUCOPHAGE-XR Take 1 tablet (500 mg total) by mouth daily. Start taking on: September 08, 2022 What changed: These instructions start on September 08, 2022. If you are unsure what to do until then, ask your doctor or other care provider.   pantoprazole 20 MG tablet Commonly known as: PROTONIX Take 20 mg by mouth at bedtime.   predniSONE 10 MG tablet Commonly known as: DELTASONE Take 1 tablet (10 mg total) by mouth daily.   Synthroid 112 MCG tablet Generic drug: levothyroxine Take 112 mcg by mouth daily before breakfast.         The results of significant diagnostics from this hospitalization (including imaging, microbiology, ancillary and laboratory) are listed below for reference.    Procedures and Diagnostic Studies:   ECHOCARDIOGRAM COMPLETE BUBBLE STUDY  Result Date: 09/06/2022    ECHOCARDIOGRAM REPORT   Patient Name:   BRITYN MASTROGIOVANNI Date of Exam: 09/06/2022  Medical Rec #:  [id number]          Height:       66.0 in Accession #:    [id number]         Weight:       195.3 lb Date of Birth:  1951/05/02          BSA:          1.980 m Patient Age:    72 years           BP:           146/68 mmHg Patient Gender: F                  HR:           62 bpm. Exam Location:  [facility] Procedure: 2D Echo, Color Doppler, Cardiac Doppler and Saline Contrast Bubble            Study Indications:     Stroke 434.91 / I63.9  History:         Patient has no prior history of Echocardiogram examinations.                  Risk Factors:Hypertension and Dyslipidemia.  Sonographer:     Sherrie Sport Referring Phys:  [id number] [facility] Korianna Washer Diagnosing Phys: Yolonda Kida MD  Sonographer Comments: Suboptimal apical window and no subcostal window. IMPRESSIONS  1. Left ventricular ejection fraction, by estimation, is 65 to 70%. The left ventricle has normal function. The left ventricle has no regional wall motion abnormalities. The left ventricular internal cavity size was mildly dilated. There is mild left ventricular hypertrophy. Left ventricular diastolic parameters are consistent with Grade I diastolic dysfunction (impaired relaxation).  2. Right ventricular systolic function is normal. The right ventricular size is normal.  3. The mitral valve is normal in structure. No evidence of mitral valve regurgitation.  4. The aortic valve is normal in structure. Aortic valve regurgitation is not visualized.  5. Agitated saline contrast bubble study was negative, with no evidence of any interatrial shunt. FINDINGS  Left Ventricle: Left ventricular ejection fraction, by estimation, is 65 to 70%. The left ventricle has normal function.  The left ventricle has no regional wall motion abnormalities. The left ventricular internal cavity size was mildly dilated. There is  mild left ventricular hypertrophy. Left ventricular diastolic parameters are consistent with Grade I diastolic dysfunction (impaired relaxation). Right  Ventricle: The right ventricular size is normal. No increase in right ventricular wall thickness. Right ventricular systolic function is normal. Left Atrium: Left atrial size was normal in size. Right Atrium: Right atrial size was normal in size. Pericardium: There is no evidence of pericardial effusion. Mitral Valve: The mitral valve is normal in structure. No evidence of mitral valve regurgitation. Tricuspid Valve: The tricuspid valve is normal in structure. Tricuspid valve regurgitation is trivial. Aortic Valve: The aortic valve is normal in structure. Aortic valve regurgitation is not visualized. Aortic valve mean gradient measures 3.5 mmHg. Aortic valve peak gradient measures 6.1 mmHg. Aortic valve area, by VTI measures 2.38 cm. Pulmonic Valve: The pulmonic valve was normal in structure. Pulmonic valve regurgitation is not visualized. Aorta: The ascending aorta was not well visualized. IAS/Shunts: No atrial level shunt detected by color flow Doppler. Agitated saline contrast was given intravenously to evaluate for intracardiac shunting. Agitated saline contrast bubble study was negative, with no evidence of any interatrial shunt.  LEFT VENTRICLE PLAX 2D LVIDd:         4.70 cm   Diastology LVIDs:         2.90 cm   LV e' medial:    6.96 cm/s LV PW:         1.50 cm   LV E/e' medial:  9.6 LV IVS:        1.00 cm   LV e' lateral:   6.53 cm/s LVOT diam:     2.10 cm   LV E/e' lateral: 10.3 LV SV:         64 LV SV Index:   32 LVOT Area:     3.46 cm  RIGHT VENTRICLE RV Basal diam:  2.90 cm RV Mid diam:    2.80 cm RV S prime:     9.79 cm/s LEFT ATRIUM             Index        RIGHT ATRIUM           Index LA diam:        2.40 cm 1.21 cm/m   RA Area:     14.90 cm LA Vol (A2C):   24.1 ml 12.17 ml/m  RA Volume:   36.60 ml  18.49 ml/m LA Vol (A4C):   25.6 ml 12.93 ml/m LA Biplane Vol: 26.5 ml 13.39 ml/m  AORTIC VALVE AV Area (Vmax):    1.98 cm AV Area (Vmean):   2.05 cm AV Area (VTI):     2.38 cm AV Vmax:            123.00 cm/s AV Vmean:          85.300 cm/s AV VTI:            0.268 m AV Peak Grad:      6.1 mmHg AV Mean Grad:      3.5 mmHg LVOT Vmax:         70.30 cm/s LVOT Vmean:        50.600 cm/s LVOT VTI:          0.184 m LVOT/AV VTI ratio: 0.69  AORTA Ao Root diam: 3.00 cm MITRAL VALVE  TRICUSPID VALVE MV Area (PHT): 2.45 cm    TR Peak grad:   13.0 mmHg MV Decel Time: 310 msec    TR Vmax:        180.00 cm/s MV E velocity: 67.10 cm/s MV A velocity: 85.40 cm/s  SHUNTS MV E/A ratio:  0.79        Systemic VTI:  0.18 m                            Systemic Diam: 2.10 cm Yolonda Kida MD Electronically signed by Yolonda Kida MD Signature Date/Time: 09/06/2022/12:34:44 PM    Final      Labs:   Basic Metabolic Panel: Recent Labs  Lab 09/05/22 1234 09/06/22 0219  NA 140 140  K 3.7 3.5  CL 105 106  CO2 24 25  GLUCOSE 123* 125*  BUN 16 17  CREATININE 0.76 0.76  CALCIUM 8.5* 8.0*    GFR Estimated Creatinine Clearance: 70.2 mL/min (by C-G formula based on SCr of 0.76 mg/dL). Liver Function Tests: Recent Labs  Lab 09/05/22 1234  AST 21  ALT 20  ALKPHOS 90  BILITOT 0.8  PROT 7.1  ALBUMIN 3.8    No results for input(s): "LIPASE", "AMYLASE" in the last 168 hours. No results for input(s): "AMMONIA" in the last 168 hours. Coagulation profile Recent Labs  Lab 09/05/22 1234  INR 1.0     CBC: Recent Labs  Lab 09/05/22 1234 09/06/22 0219  WBC 6.9 7.6  NEUTROABS 4.4  --   HGB 13.6 12.5  HCT 42.5 39.0  MCV 86.0 86.1  PLT 336 296    Cardiac Enzymes: No results for input(s): "CKTOTAL", "CKMB", "CKMBINDEX", "TROPONINI" in the last 168 hours. BNP: Invalid input(s): "POCBNP" CBG: Recent Labs  Lab 09/05/22 1230 09/05/22 1659 09/05/22 1949 09/06/22 0800 09/06/22 1130  GLUCAP 140* 108* 127* 136* 125*    D-Dimer No results for input(s): "DDIMER" in the last 72 hours. Hgb A1c Recent Labs    09/05/22 1234  HGBA1C 6.6*    Lipid Profile Recent Labs     09/06/22 0219  CHOL 214*  HDL 58  LDLCALC 141*  TRIG 75  CHOLHDL 3.7    Thyroid function studies Recent Labs    09/05/22 1234  TSH 1.330    Anemia work up No results for input(s): "VITAMINB12", "FOLATE", "FERRITIN", "TIBC", "IRON", "RETICCTPCT" in the last 72 hours. Microbiology No results found for this or any previous visit (from the past 240 hour(s)).  Time coordinating discharge: 35 minutes  Signed: Finnleigh Marchetti  [facility] 09/06/2022, 2:08 PM

## 2022-09-07 ENCOUNTER — Other Ambulatory Visit: Payer: Self-pay | Admitting: Family Medicine

## 2022-09-07 DIAGNOSIS — Z1231 Encounter for screening mammogram for malignant neoplasm of breast: Secondary | ICD-10-CM

## 2022-09-27 ENCOUNTER — Ambulatory Visit
Admission: RE | Admit: 2022-09-27 | Discharge: 2022-09-27 | Disposition: A | Discharge status: Home / Self Care | Payer: Medicare HMO | Source: Ambulatory Visit | Attending: Family Medicine | Admitting: Family Medicine

## 2022-09-27 DIAGNOSIS — Z1231 Encounter for screening mammogram for malignant neoplasm of breast: Secondary | ICD-10-CM | POA: Diagnosis present

## 2022-10-02 ENCOUNTER — Encounter: Payer: Self-pay | Admitting: Family Medicine

## 2022-10-02 DIAGNOSIS — N6489 Other specified disorders of breast: Secondary | ICD-10-CM

## 2022-10-02 DIAGNOSIS — R928 Other abnormal and inconclusive findings on diagnostic imaging of breast: Secondary | ICD-10-CM

## 2022-10-03 ENCOUNTER — Other Ambulatory Visit: Payer: Self-pay | Admitting: Family Medicine

## 2022-10-03 DIAGNOSIS — R928 Other abnormal and inconclusive findings on diagnostic imaging of breast: Secondary | ICD-10-CM

## 2022-10-03 DIAGNOSIS — N6489 Other specified disorders of breast: Secondary | ICD-10-CM

## 2022-10-04 ENCOUNTER — Ambulatory Visit
Admission: RE | Admit: 2022-10-04 | Discharge: 2022-10-04 | Disposition: A | Discharge status: Home / Self Care | Payer: Medicare HMO | Source: Ambulatory Visit | Attending: Family Medicine | Admitting: Family Medicine

## 2022-10-04 DIAGNOSIS — N6489 Other specified disorders of breast: Secondary | ICD-10-CM

## 2022-10-04 DIAGNOSIS — R928 Other abnormal and inconclusive findings on diagnostic imaging of breast: Secondary | ICD-10-CM

## 2023-07-04 DIAGNOSIS — Z789 Other specified health status: Secondary | ICD-10-CM | POA: Insufficient documentation

## 2023-07-12 ENCOUNTER — Encounter: Payer: Self-pay | Admitting: Dermatology

## 2023-07-12 ENCOUNTER — Ambulatory Visit: Payer: Medicare HMO | Admitting: Dermatology

## 2023-07-12 DIAGNOSIS — W908XXA Exposure to other nonionizing radiation, initial encounter: Secondary | ICD-10-CM

## 2023-07-12 DIAGNOSIS — Z1283 Encounter for screening for malignant neoplasm of skin: Secondary | ICD-10-CM

## 2023-07-12 DIAGNOSIS — L821 Other seborrheic keratosis: Secondary | ICD-10-CM

## 2023-07-12 DIAGNOSIS — Z85828 Personal history of other malignant neoplasm of skin: Secondary | ICD-10-CM

## 2023-07-12 DIAGNOSIS — D492 Neoplasm of unspecified behavior of bone, soft tissue, and skin: Secondary | ICD-10-CM | POA: Diagnosis not present

## 2023-07-12 DIAGNOSIS — D0439 Carcinoma in situ of skin of other parts of face: Secondary | ICD-10-CM

## 2023-07-12 DIAGNOSIS — L578 Other skin changes due to chronic exposure to nonionizing radiation: Secondary | ICD-10-CM

## 2023-07-12 DIAGNOSIS — L814 Other melanin hyperpigmentation: Secondary | ICD-10-CM | POA: Diagnosis not present

## 2023-07-12 DIAGNOSIS — D1801 Hemangioma of skin and subcutaneous tissue: Secondary | ICD-10-CM

## 2023-07-12 DIAGNOSIS — D229 Melanocytic nevi, unspecified: Secondary | ICD-10-CM

## 2023-07-12 DIAGNOSIS — D099 Carcinoma in situ, unspecified: Secondary | ICD-10-CM

## 2023-07-12 HISTORY — DX: Carcinoma in situ, unspecified: D09.9

## 2023-07-12 NOTE — Progress Notes (Signed)
Follow-Up Visit   Subjective  Rebecca Conner is a 72 y.o. female who presents for the following: Skin Cancer Screening and Full Body Skin Exam. Hx of BCC.  Check left cheek. C/O red area that stings and burns.   The patient presents for Total-Body Skin Exam (TBSE) for skin cancer screening and mole check. The patient has spots, moles and lesions to be evaluated, some may be new or changing and the patient may have concern these could be cancer.    The following portions of the chart were reviewed this encounter and updated as appropriate: medications, allergies, medical history  Review of Systems:  No other skin or systemic complaints except as noted in HPI or Assessment and Plan.  Objective  Well appearing patient in no apparent distress; mood and affect are within normal limits.  A full examination was performed including scalp, head, eyes, ears, nose, lips, neck, chest, axillae, abdomen, back, buttocks, bilateral upper extremities, bilateral lower extremities, hands, feet, fingers, toes, fingernails, and toenails. All findings within normal limits unless otherwise noted below.   Relevant physical exam findings are noted in the Assessment and Plan.  Left Lateral Cheek 12 mm well demarcated erythematous scaly plaque   Assessment & Plan   HISTORY OF BASAL CELL CARCINOMA OF THE SKIN. Right superior nasolabial fold. 12/20/2018. Mohs. - No evidence of recurrence today - Recommend regular full body skin exams - Recommend daily broad spectrum sunscreen SPF 30+ to sun-exposed areas, reapply every 2 hours as needed.  - Call if any new or changing lesions are noted between office visits   SKIN CANCER SCREENING PERFORMED TODAY.  ACTINIC DAMAGE - Chronic condition, secondary to cumulative UV/sun exposure - diffuse scaly erythematous macules with underlying dyspigmentation - Recommend daily broad spectrum sunscreen SPF 30+ to sun-exposed areas, reapply every 2 hours as needed.  -  Staying in the shade or wearing long sleeves, sun glasses (UVA+UVB protection) and wide brim hats (4-inch brim around the entire circumference of the hat) are also recommended for sun protection.  - Call for new or changing lesions.  LENTIGINES, SEBORRHEIC KERATOSES, HEMANGIOMAS - Benign normal skin lesions - Benign-appearing - Call for any changes  MELANOCYTIC NEVI - Tan-brown and/or pink-flesh-colored symmetric macules and papules - Benign appearing on exam today - Observation - Call clinic for new or changing moles - Recommend daily use of broad spectrum spf 30+ sunscreen to sun-exposed areas.       NEOPLASM OF SKIN Left Lateral Cheek Skin / nail biopsy Type of biopsy: tangential   Informed consent: discussed and consent obtained   Timeout: patient name, date of birth, surgical site, and procedure verified   Procedure prep:  Patient was prepped and draped in usual sterile fashion Prep type:  Isopropyl alcohol Anesthesia: the lesion was anesthetized in a standard fashion   Anesthetic:  1% lidocaine w/ epinephrine 1-100,000 buffered w/ 8.4% NaHCO3 Instrument used: DermaBlade   Hemostasis achieved with: pressure and aluminum chloride   Outcome: patient tolerated procedure well   Post-procedure details: sterile dressing applied and wound care instructions given   Dressing type: bandage and petrolatum   Specimen 1 - Surgical pathology Differential Diagnosis: R/O SCC vs BCC  Check Margins: No Return in about 1 year (around 07/11/2024) for TBSE, HxBCC.  I, Lawson Radar, CMA, am acting as scribe for Elie Goody, MD.   Documentation: I have reviewed the above documentation for accuracy and completeness, and I agree with the above.  Elie Goody, MD

## 2023-07-12 NOTE — Patient Instructions (Addendum)
 Wound Care Instructions  Cleanse wound gently with soap and water once a day then pat dry with clean gauze. Apply a thin coat of Petrolatum (petroleum jelly, "Vaseline") over the wound (unless you have an allergy to this). We recommend that you use a new, sterile tube of Vaseline. Do not pick or remove scabs. Do not remove the yellow or white "healing tissue" from the base of the wound.  Cover the wound with fresh, clean, nonstick gauze and secure with paper tape. You may use Band-Aids in place of gauze and tape if the wound is small enough, but would recommend trimming much of the tape off as there is often too much. Sometimes Band-Aids can irritate the skin.  You should call the office for your biopsy report after 1 week if you have not already been contacted.  If you experience any problems, such as abnormal amounts of bleeding, swelling, significant bruising, significant pain, or evidence of infection, please call the office immediately.  FOR ADULT SURGERY PATIENTS: If you need something for pain relief you may take 1 extra strength Tylenol (acetaminophen) AND 2 Ibuprofen (200mg  each) together every 4 hours as needed for pain. (do not take these if you are allergic to them or if you have a reason you should not take them.) Typically, you may only need pain medication for 1 to 3 days.     Recommend daily broad spectrum sunscreen SPF 30+ to sun-exposed areas, reapply every 2 hours as needed. Call for new or changing lesions.  Staying in the shade or wearing long sleeves, sun glasses (UVA+UVB protection) and wide brim hats (4-inch brim around the entire circumference of the hat) are also recommended for sun protection.    Melanoma ABCDEs  Melanoma is the most dangerous type of skin cancer, and is the leading cause of death from skin disease.  You are more likely to develop melanoma if you: Have light-colored skin, light-colored eyes, or red or blond hair Spend a lot of time in the sun Tan  regularly, either outdoors or in a tanning bed Have had blistering sunburns, especially during childhood Have a close family member who has had a melanoma Have atypical moles or large birthmarks  Early detection of melanoma is key since treatment is typically straightforward and cure rates are extremely high if we catch it early.   The first sign of melanoma is often a change in a mole or a new dark spot.  The ABCDE system is a way of remembering the signs of melanoma.  A for asymmetry:  The two halves do not match. B for border:  The edges of the growth are irregular. C for color:  A mixture of colors are present instead of an even brown color. D for diameter:  Melanomas are usually (but not always) greater than 6mm - the size of a pencil eraser. E for evolution:  The spot keeps changing in size, shape, and color.  Please check your skin once per month between visits. You can use a small mirror in front and a large mirror behind you to keep an eye on the back side or your body.   If you see any new or changing lesions before your next follow-up, please call to schedule a visit.  Please continue daily skin protection including broad spectrum sunscreen SPF 30+ to sun-exposed areas, reapplying every 2 hours as needed when you're outdoors.   Staying in the shade or wearing long sleeves, sun glasses (UVA+UVB protection) and wide brim  hats (4-inch brim around the entire circumference of the hat) are also recommended for sun protection.    Due to recent changes in healthcare laws, you may see results of your pathology and/or laboratory studies on MyChart before the doctors have had a chance to review them. We understand that in some cases there may be results that are confusing or concerning to you. Please understand that not all results are received at the same time and often the doctors may need to interpret multiple results in order to provide you with the best plan of care or course of  treatment. Therefore, we ask that you please give [place] 2 business days to thoroughly review all your results before contacting the office for clarification. Should we see a critical lab result, you will be contacted sooner.   If You Need Anything After Your Visit  If you have any questions or concerns for your doctor, please call our main line at [phone] and press option 4 to reach your doctor's medical assistant. If no one answers, please leave a voicemail as directed and we will return your call as soon as possible. Messages left after 4 pm will be answered the following business day.   You may also send [place] a message via MyChart. We typically respond to MyChart messages within 1-2 business days.  For prescription refills, please ask your pharmacy to contact our office. Our fax number is [phone].  If you have an urgent issue when the clinic is closed that cannot wait until the next business day, you can page your doctor at the number below.    Please note that while we do our best to be available for urgent issues outside of office hours, we are not available 24/7.   If you have an urgent issue and are unable to reach [place], you may choose to seek medical care at your doctor's office, retail clinic, urgent care center, or emergency room.  If you have a medical emergency, please immediately call 911 or go to the emergency department.  Pager Numbers  - Dr. Gwen Pounds: [phone]  - Dr. Roseanne Reno: [phone]  - Dr. Katrinka Blazing: [phone]   In the event of inclement weather, please call our main line at [phone] for an update on the status of any delays or closures.  Dermatology Medication Tips: Please keep the boxes that topical medications come in in order to help keep track of the instructions about where and how to use these. Pharmacies typically print the medication instructions only on the boxes and not directly on the medication tubes.   If your medication is too expensive,  please contact our office at [phone] option 4 or send [place] a message through MyChart.   We are unable to tell what your co-pay for medications will be in advance as this is different depending on your insurance coverage. However, we may be able to find a substitute medication at lower cost or fill out paperwork to get insurance to cover a needed medication.   If a prior authorization is required to get your medication covered by your insurance company, please allow [place] 1-2 business days to complete this process.  Drug prices often vary depending on where the prescription is filled and some pharmacies may offer cheaper prices.  The website www.goodrx.com contains coupons for medications through different pharmacies. The prices here do not account for what the cost may be with help from insurance (it may be cheaper with your insurance), but the website can give you  the price if you did not use any insurance.  - You can print the associated coupon and take it with your prescription to the pharmacy.  - You may also stop by our office during regular business hours and pick up a GoodRx coupon card.  - If you need your prescription sent electronically to a different pharmacy, notify our office through [facility] or by phone at [phone] option 4.     Si Usted Necesita Algo Despus de Su Visita  Tambin puede enviarnos un mensaje a travs de Clinical cytogeneticist. Por lo general respondemos a los mensajes de MyChart en el transcurso de 1 a 2 das hbiles.  Para renovar recetas, por favor pida a su farmacia que se ponga en contacto con nuestra oficina. Annie Sable de fax es [place] [phone].  Si tiene un asunto urgente cuando la clnica est cerrada y que no puede esperar hasta el siguiente da hbil, puede llamar/localizar a su doctor(a) al nmero que aparece a continuacin.   Por favor, tenga en cuenta que aunque hacemos todo lo posible para estar disponibles para asuntos urgentes fuera del  horario de [place], no estamos disponibles las 24 horas del da, los 7 [address]  [address] de la [place].   Si tiene un problema urgente y no puede comunicarse con nosotros, puede optar por buscar atencin mdica  en el consultorio de su doctor(a), en una clnica privada, en un centro de atencin urgente o en una sala de emergencias.  Si tiene Engineer, drilling, por favor llame inmediatamente al 911 o vaya a la sala de emergencias.  Nmeros de bper  - Dr. Gwen Pounds: [phone]  - Dra. Roseanne Reno: [phone]  - Dr. Katrinka Blazing: [phone]   En caso de inclemencias del tiempo, por favor llame a Lacy Duverney principal al [phone] para una actualizacin sobre el [place] de cualquier retraso o cierre.  Consejos para la medicacin en dermatologa: Por favor, guarde las cajas en las que vienen los medicamentos de uso tpico para ayudarle a seguir las instrucciones sobre dnde y cmo usarlos. Las farmacias generalmente imprimen las instrucciones del medicamento slo en las cajas y no directamente en los tubos del [place].   Si su medicamento es muy caro, por favor, pngase en contacto con Rolm Gala llamando al [phone] y presione la opcin 4 o envenos un mensaje a travs de Clinical cytogeneticist.   No podemos decirle cul ser su copago por los medicamentos por adelantado ya que esto es diferente dependiendo de la cobertura de su seguro. Sin embargo, es posible que podamos encontrar un medicamento sustituto a Audiological scientist un formulario para que el seguro cubra el medicamento que se considera necesario.   Si se requiere una autorizacin previa para que su compaa de seguros [place] su medicamento, por favor permtanos de 1 a 2 das hbiles para completar [address]  Los precios de los medicamentos varan con frecuencia dependiendo del Environmental consultant de dnde se surte la receta y alguna farmacias pueden ofrecer precios ms baratos.  El sitio web www.goodrx.com tiene cupones para medicamentos de Engineer, civil (consulting). Los precios aqu no tienen en cuenta lo que podra costar con la ayuda del seguro (puede ser ms barato con su seguro), pero el sitio web puede darle el precio si no utiliz Tourist information centre manager.  - Puede imprimir el cupn correspondiente y llevarlo con su receta a la farmacia.  - Tambin puede pasar por nuestra oficina durante el horario de atencin regular y Education officer, museum una tarjeta de cupones de GoodRx.  - Si  necesita que su receta se enve electrnicamente a una farmacia diferente, informe a nuestra oficina a travs de MyChart de [place] o por telfono llamando al [phone] y presione la opcin 4.

## 2023-07-17 LAB — SURGICAL PATHOLOGY

## 2023-07-18 ENCOUNTER — Telehealth: Payer: Self-pay

## 2023-07-18 DIAGNOSIS — D099 Carcinoma in situ, unspecified: Secondary | ICD-10-CM

## 2023-07-18 NOTE — Telephone Encounter (Signed)
-----   Message from [place] sent at 07/18/2023 10:44 AM EST ----- Diagnosis left lateral cheek :       SQUAMOUS CELL CARCINOMA IN SITU, BASE INVOLVED   Please call with diagnosis and message me with patient's decision on treatment.   Explanation: This is a squamous cell skin cancer limited to the top layer of skin. This means it is an early cancer and has not spread. However, it has the potential to spread beyond the skin and threaten your health, so we recommend treating it.   Treatment option 1: a cream (fluorouracil and calcipotriene) that helps your immune system clear the skin cancer. It will cause redness and irritation. Wait two weeks after the biopsy to start applying the cream. Apply the cream twice per day until the redness and irritation develop (usually occurs by day 7), then stop and allow it to heal. We will recheck the area in 2 months to ensure the cancer is gone. The cream is $35 plus shipping and will be mailed to you from a low cost compounding pharmacy.  Treatment option 3: Mohs surgery, which involves cutting out right around the skin cancer and then checking under the microscope on the same day to ensure the whole skin cancer is out. If there is more cancer remaining, the surgeon will repeat the process until it is fully cleared. The cure rate is about 98-99%. It is done at another office outside of [place] ([place], [place], or [place]). Once the Mohs surgeon confirms the skin cancer is out, they will discuss the options to repair or heal the area. You must take it easy for about two weeks after surgery (no lifting over 10-15 lbs, avoid activity to get your heart rate and blood pressure up).

## 2023-07-18 NOTE — Telephone Encounter (Signed)
Patient advised pathology showed SCCis, prefers Mohs in [place]. Referral sent to [facility]. Butch Penny., RMA

## 2023-08-07 ENCOUNTER — Other Ambulatory Visit: Payer: Self-pay | Admitting: Cardiology

## 2023-08-07 DIAGNOSIS — R0789 Other chest pain: Secondary | ICD-10-CM

## 2023-08-07 DIAGNOSIS — R9439 Abnormal result of other cardiovascular function study: Secondary | ICD-10-CM

## 2023-08-10 ENCOUNTER — Telehealth (HOSPITAL_COMMUNITY): Payer: Self-pay | Admitting: *Deleted

## 2023-08-10 NOTE — Telephone Encounter (Signed)
 Reaching out to patient to offer assistance regarding upcoming cardiac imaging study; pt verbalizes understanding of appt date/time, parking situation and where to check in, pre-test NPO status, and verified current allergies; name and call back number provided for further questions should they arise  Chantal Requena RN Navigator Cardiac Imaging [facility] [phone] office [phone] cell  Patient to take her daily medications for her test.

## 2023-08-10 NOTE — Telephone Encounter (Signed)
 Attempted to call patient regarding upcoming cardiac CT appointment. Left message on voicemail with name and callback number  Larey Brick RN Navigator Cardiac Imaging [facility] [phone] Office [phone] Cell

## 2023-08-13 ENCOUNTER — Ambulatory Visit
Admission: RE | Admit: 2023-08-13 | Discharge: 2023-08-13 | Disposition: A | Discharge status: Home / Self Care | Payer: Medicare HMO | Source: Ambulatory Visit | Attending: Cardiology | Admitting: Cardiology

## 2023-08-13 DIAGNOSIS — R0789 Other chest pain: Secondary | ICD-10-CM | POA: Insufficient documentation

## 2023-08-13 DIAGNOSIS — R9439 Abnormal result of other cardiovascular function study: Secondary | ICD-10-CM | POA: Diagnosis present

## 2023-08-13 DIAGNOSIS — M47814 Spondylosis without myelopathy or radiculopathy, thoracic region: Secondary | ICD-10-CM | POA: Insufficient documentation

## 2023-08-13 LAB — POCT I-STAT CREATININE: Creatinine, Ser: 0.9 mg/dL (ref 0.44–1.00)

## 2023-08-13 MED ORDER — IOHEXOL 350 MG/ML SOLN
80.0000 mL | Freq: Once | INTRAVENOUS | Status: AC | PRN
  Administered 2023-08-13: 80 mL via INTRAVENOUS

## 2023-08-13 MED ORDER — DILTIAZEM HCL 25 MG/5ML IV SOLN
10.0000 mg | INTRAVENOUS | Status: DC | PRN
  Filled 2023-08-13: qty 5

## 2023-08-13 MED ORDER — NITROGLYCERIN 0.4 MG SL SUBL
0.8000 mg | SUBLINGUAL_TABLET | Freq: Once | SUBLINGUAL | Status: AC
Start: 2023-08-13 — End: 2023-08-13
  Administered 2023-08-13: 0.8 mg via SUBLINGUAL
  Filled 2023-08-13: qty 25

## 2023-08-13 MED ORDER — METOPROLOL TARTRATE 5 MG/5ML IV SOLN
10.0000 mg | INTRAVENOUS | Status: DC | PRN
  Filled 2023-08-13: qty 10

## 2023-08-13 NOTE — Progress Notes (Signed)

## 2023-08-22 ENCOUNTER — Other Ambulatory Visit: Payer: Self-pay | Admitting: Family Medicine

## 2023-08-22 DIAGNOSIS — R911 Solitary pulmonary nodule: Secondary | ICD-10-CM

## 2023-08-31 ENCOUNTER — Ambulatory Visit
Admission: RE | Admit: 2023-08-31 | Discharge: 2023-08-31 | Disposition: A | Discharge status: Home / Self Care | Payer: Medicare HMO | Source: Ambulatory Visit | Attending: Family Medicine | Admitting: Family Medicine

## 2023-08-31 DIAGNOSIS — R911 Solitary pulmonary nodule: Secondary | ICD-10-CM | POA: Diagnosis present

## 2023-11-09 ENCOUNTER — Other Ambulatory Visit: Payer: Self-pay | Admitting: Family Medicine

## 2023-11-09 DIAGNOSIS — Z1231 Encounter for screening mammogram for malignant neoplasm of breast: Secondary | ICD-10-CM

## 2023-11-22 ENCOUNTER — Ambulatory Visit
Admission: RE | Admit: 2023-11-22 | Discharge: 2023-11-22 | Disposition: A | Discharge status: Home / Self Care | Source: Ambulatory Visit | Attending: Family Medicine | Admitting: Family Medicine

## 2023-11-22 DIAGNOSIS — Z1231 Encounter for screening mammogram for malignant neoplasm of breast: Secondary | ICD-10-CM | POA: Insufficient documentation

## 2024-02-18 ENCOUNTER — Other Ambulatory Visit: Payer: Self-pay | Admitting: Specialist

## 2024-02-18 DIAGNOSIS — J849 Interstitial pulmonary disease, unspecified: Secondary | ICD-10-CM

## 2024-02-18 DIAGNOSIS — R918 Other nonspecific abnormal finding of lung field: Secondary | ICD-10-CM

## 2024-02-18 DIAGNOSIS — Z87898 Personal history of other specified conditions: Secondary | ICD-10-CM

## 2024-02-27 ENCOUNTER — Ambulatory Visit
Admission: RE | Admit: 2024-02-27 | Discharge: 2024-02-27 | Disposition: A | Discharge status: Home / Self Care | Source: Ambulatory Visit | Attending: Specialist | Admitting: Specialist

## 2024-02-27 DIAGNOSIS — Z87898 Personal history of other specified conditions: Secondary | ICD-10-CM | POA: Diagnosis present

## 2024-02-27 DIAGNOSIS — J849 Interstitial pulmonary disease, unspecified: Secondary | ICD-10-CM | POA: Diagnosis present

## 2024-02-27 DIAGNOSIS — R918 Other nonspecific abnormal finding of lung field: Secondary | ICD-10-CM | POA: Insufficient documentation

## 2024-07-17 ENCOUNTER — Ambulatory Visit: Payer: Medicare HMO | Admitting: Dermatology

## 2024-07-17 ENCOUNTER — Encounter: Payer: Self-pay | Admitting: Dermatology

## 2024-07-17 DIAGNOSIS — W908XXA Exposure to other nonionizing radiation, initial encounter: Secondary | ICD-10-CM | POA: Diagnosis not present

## 2024-07-17 DIAGNOSIS — D229 Melanocytic nevi, unspecified: Secondary | ICD-10-CM

## 2024-07-17 DIAGNOSIS — L578 Other skin changes due to chronic exposure to nonionizing radiation: Secondary | ICD-10-CM | POA: Diagnosis not present

## 2024-07-17 DIAGNOSIS — Z1283 Encounter for screening for malignant neoplasm of skin: Secondary | ICD-10-CM

## 2024-07-17 DIAGNOSIS — D492 Neoplasm of unspecified behavior of bone, soft tissue, and skin: Secondary | ICD-10-CM

## 2024-07-17 DIAGNOSIS — Z872 Personal history of diseases of the skin and subcutaneous tissue: Secondary | ICD-10-CM

## 2024-07-17 DIAGNOSIS — C44612 Basal cell carcinoma of skin of right upper limb, including shoulder: Secondary | ICD-10-CM | POA: Diagnosis not present

## 2024-07-17 DIAGNOSIS — L821 Other seborrheic keratosis: Secondary | ICD-10-CM | POA: Diagnosis not present

## 2024-07-17 DIAGNOSIS — L814 Other melanin hyperpigmentation: Secondary | ICD-10-CM

## 2024-07-17 DIAGNOSIS — Z85828 Personal history of other malignant neoplasm of skin: Secondary | ICD-10-CM | POA: Diagnosis not present

## 2024-07-17 DIAGNOSIS — D1801 Hemangioma of skin and subcutaneous tissue: Secondary | ICD-10-CM

## 2024-07-17 DIAGNOSIS — Z86007 Personal history of in-situ neoplasm of skin: Secondary | ICD-10-CM

## 2024-07-17 NOTE — Patient Instructions (Addendum)

## 2024-07-17 NOTE — Progress Notes (Signed)
 Follow-Up Visit   Subjective  Rebecca Conner is a 73 y.o. female who presents for the following: Skin Cancer Screening and Full Body Skin Exam, hx of BCC, SCC IS, Aks She has a flaky spot under her right eye, no itch, or burning sensation.  The patient presents for Total-Body Skin Exam (TBSE) for skin cancer screening and mole check. The patient has spots, moles and lesions to be evaluated, some may be new or changing and the patient may have concern these could be cancer.    The following portions of the chart were reviewed this encounter and updated as appropriate: medications, allergies, medical history  Review of Systems:  No other skin or systemic complaints except as noted in HPI or Assessment and Plan.  Objective  Well appearing patient in no apparent distress; mood and affect are within normal limits.  A full examination was performed including scalp, head, eyes, ears, nose, lips, neck, chest, axillae, abdomen, back, buttocks, bilateral upper extremities, bilateral lower extremities, hands, feet, fingers, toes, fingernails, and toenails. All findings within normal limits unless otherwise noted below.   Relevant physical exam findings are noted in the Assessment and Plan.  R upper lat arm 6.85mm pink macule with pigment globules   Assessment & Plan   SKIN CANCER SCREENING PERFORMED TODAY.  ACTINIC DAMAGE - Chronic condition, secondary to cumulative UV/sun exposure - diffuse scaly erythematous macules with underlying dyspigmentation - Recommend daily broad spectrum sunscreen SPF 30+ to sun-exposed areas, reapply every 2 hours as needed.  - Staying in the shade or wearing long sleeves, sun glasses (UVA+UVB protection) and wide brim hats (4-inch brim around the entire circumference of the hat) are also recommended for sun protection.  - Call for new or changing lesions.  LENTIGINES, SEBORRHEIC KERATOSES, HEMANGIOMAS - Benign normal skin lesions - Benign-appearing -  Call for any changes  MELANOCYTIC NEVI - Tan-brown and/or pink-flesh-colored symmetric macules and papules - Benign appearing on exam today - Observation - Call clinic for new or changing moles - Recommend daily use of broad spectrum spf 30+ sunscreen to sun-exposed areas.   HISTORY OF BASAL CELL CARCINOMA OF THE SKIN - No evidence of recurrence today - Recommend regular full body skin exams - Recommend daily broad spectrum sunscreen SPF 30+ to sun-exposed areas, reapply every 2 hours as needed.  - Call if any new or changing lesions are noted between office visits  - Superior nasolabial fold mohs 12/20/2018  HISTORY OF SQUAMOUS CELL CARCINOMA IN SITU OF THE SKIN - No evidence of recurrence today - Recommend regular full body skin exams - Recommend daily broad spectrum sunscreen SPF 30+ to sun-exposed areas, reapply every 2 hours as needed.  - Call if any new or changing lesions are noted between office visits  - L lat cheek, mohs 10/01/2023  HISTORY OF PRECANCEROUS ACTINIC KERATOSIS - site(s) of PreCancerous Actinic Keratosis clear today. - these may recur and new lesions may form requiring treatment to prevent transformation into skin cancer - observe for new or changing spots and contact [facility] for appointment if occur - photoprotection with sun protective clothing; sunglasses and broad spectrum sunscreen with SPF of at least 30 + and frequent self skin exams recommended - yearly exams by a dermatologist recommended for persons with history of PreCancerous Actinic Keratoses  NEOPLASM OF SKIN R upper lat arm - Skin / nail biopsy Type of biopsy: tangential   Informed consent: discussed and consent obtained   Timeout: patient name, date  of birth, surgical site, and procedure verified   Procedure prep:  Patient was prepped and draped in usual sterile fashion Prep type:  Isopropyl alcohol Anesthesia: the lesion was anesthetized in a standard fashion   Anesthetic:  1%  lidocaine  w/ epinephrine  1-100,000 buffered w/ 8.4% NaHCO3 Instrument used: DermaBlade   Hemostasis achieved with: pressure and aluminum chloride   Outcome: patient tolerated procedure well   Post-procedure details: sterile dressing applied and wound care instructions given   Dressing type: bandage and petrolatum    Specimen 1 - Surgical pathology Differential Diagnosis: BCC  Check Margins: No 6.57mm pink macule with pigment globules MULTIPLE BENIGN NEVI   LENTIGINES   ACTINIC ELASTOSIS   SEBORRHEIC KERATOSES   CHERRY ANGIOMA   Return in about 1 year (around 07/17/2025) for TBSE, Hx of BCC, Hx of SCC IS, Hx of AKs.  I, Grayce Saunas, RMA, am acting as scribe for Boneta Sharps, MD .   Documentation: I have reviewed the above documentation for accuracy and completeness, and I agree with the above.  Boneta Sharps, MD

## 2024-07-23 LAB — SURGICAL PATHOLOGY

## 2024-07-28 ENCOUNTER — Ambulatory Visit: Payer: Self-pay | Admitting: Dermatology

## 2024-07-29 ENCOUNTER — Encounter: Payer: Self-pay | Admitting: Dermatology

## 2024-07-29 NOTE — Telephone Encounter (Signed)
-----   Message from Boneta Sharps, MD sent at 07/28/2024 10:30 PM EST ----- Diagnosis: R upper lat arm :       BASAL CELL CARCINOMA, SUPERFICIAL AND NODULAR PATTERNS   Please call  Explanation: your biopsy shows a basal cell skin cancer in the second layer of the skin. This is the most common kind of skin cancer and is caused by damage from sun exposure. Basal cell skin cancers  almost never spread beyond the skin, so they are not dangerous to your overall health. However, they will continue to grow, can bleed, cause nonhealing wounds, and disrupt nearby structures unless  fully treated.  Treatment: EDC vs excision

## 2024-07-29 NOTE — Telephone Encounter (Signed)
 Spoke wit patient and advised of results. Patient opted for excision, patient scheduled for 09/10/24 with Dr. Claudene.

## 2024-08-01 ENCOUNTER — Other Ambulatory Visit: Payer: Self-pay | Admitting: Specialist

## 2024-08-01 DIAGNOSIS — Z87898 Personal history of other specified conditions: Secondary | ICD-10-CM

## 2024-08-01 DIAGNOSIS — R918 Other nonspecific abnormal finding of lung field: Secondary | ICD-10-CM

## 2024-08-29 ENCOUNTER — Ambulatory Visit
Admission: RE | Admit: 2024-08-29 | Discharge: 2024-08-29 | Disposition: A | Source: Ambulatory Visit | Attending: Specialist | Admitting: Specialist

## 2024-08-29 DIAGNOSIS — Z87898 Personal history of other specified conditions: Secondary | ICD-10-CM | POA: Diagnosis present

## 2024-08-29 DIAGNOSIS — R918 Other nonspecific abnormal finding of lung field: Secondary | ICD-10-CM | POA: Diagnosis present

## 2024-09-10 ENCOUNTER — Encounter: Admitting: Dermatology

## 2025-07-16 ENCOUNTER — Ambulatory Visit: Admitting: Dermatology
